# Patient Record
Sex: Male | Born: 2000 | Race: Black or African American | Hispanic: No | Marital: Single | Smoking: Current every day smoker
Health system: Southern US, Community
[De-identification: ages and names within clinical notes are randomized; demographics above are authoritative.]

---

## 2020-07-26 ENCOUNTER — Other Ambulatory Visit: Payer: Self-pay

## 2020-07-26 ENCOUNTER — Ambulatory Visit (HOSPITAL_COMMUNITY)
Admission: EM | Admit: 2020-07-26 | Discharge: 2020-07-26 | Disposition: A | Discharge status: Home / Self Care | Payer: Self-pay

## 2020-07-26 ENCOUNTER — Emergency Department (HOSPITAL_COMMUNITY): Payer: No Typology Code available for payment source

## 2020-07-26 ENCOUNTER — Emergency Department (HOSPITAL_COMMUNITY)
Admission: EM | Admit: 2020-07-26 | Discharge: 2020-07-31 | Disposition: A | Discharge status: Home / Self Care | Payer: No Typology Code available for payment source | Attending: Emergency Medicine | Admitting: Emergency Medicine

## 2020-07-26 ENCOUNTER — Encounter (HOSPITAL_COMMUNITY): Payer: Self-pay | Admitting: Emergency Medicine

## 2020-07-26 DIAGNOSIS — Y92410 Unspecified street and highway as the place of occurrence of the external cause: Secondary | ICD-10-CM | POA: Insufficient documentation

## 2020-07-26 DIAGNOSIS — F3113 Bipolar disorder, current episode manic without psychotic features, severe: Secondary | ICD-10-CM | POA: Insufficient documentation

## 2020-07-26 DIAGNOSIS — F309 Manic episode, unspecified: Secondary | ICD-10-CM

## 2020-07-26 DIAGNOSIS — M25561 Pain in right knee: Secondary | ICD-10-CM | POA: Diagnosis not present

## 2020-07-26 DIAGNOSIS — Z046 Encounter for general psychiatric examination, requested by authority: Secondary | ICD-10-CM | POA: Diagnosis present

## 2020-07-26 DIAGNOSIS — Y900 Blood alcohol level of less than 20 mg/100 ml: Secondary | ICD-10-CM | POA: Insufficient documentation

## 2020-07-26 DIAGNOSIS — Z79899 Other long term (current) drug therapy: Secondary | ICD-10-CM | POA: Diagnosis not present

## 2020-07-26 DIAGNOSIS — F3110 Bipolar disorder, current episode manic without psychotic features, unspecified: Secondary | ICD-10-CM | POA: Diagnosis present

## 2020-07-26 DIAGNOSIS — Z20822 Contact with and (suspected) exposure to covid-19: Secondary | ICD-10-CM | POA: Diagnosis not present

## 2020-07-26 DIAGNOSIS — F1220 Cannabis dependence, uncomplicated: Secondary | ICD-10-CM | POA: Diagnosis not present

## 2020-07-26 DIAGNOSIS — R519 Headache, unspecified: Secondary | ICD-10-CM | POA: Insufficient documentation

## 2020-07-26 LAB — CBC WITH DIFFERENTIAL/PLATELET
Abs Immature Granulocytes: 0.04 10*3/uL (ref 0.00–0.07)
Basophils Absolute: 0 10*3/uL (ref 0.0–0.1)
Basophils Relative: 0 %
Eosinophils Absolute: 0 10*3/uL (ref 0.0–0.5)
Eosinophils Relative: 0 %
HCT: 42.5 % (ref 39.0–52.0)
Hemoglobin: 13.6 g/dL (ref 13.0–17.0)
Immature Granulocytes: 0 %
Lymphocytes Relative: 15 %
Lymphs Abs: 2 10*3/uL (ref 0.7–4.0)
MCH: 27.6 pg (ref 26.0–34.0)
MCHC: 32 g/dL (ref 30.0–36.0)
MCV: 86.4 fL (ref 80.0–100.0)
Monocytes Absolute: 1.2 10*3/uL — ABNORMAL HIGH (ref 0.1–1.0)
Monocytes Relative: 9 %
Neutro Abs: 9.6 10*3/uL — ABNORMAL HIGH (ref 1.7–7.7)
Neutrophils Relative %: 76 %
Platelets: 317 10*3/uL (ref 150–400)
RBC: 4.92 MIL/uL (ref 4.22–5.81)
RDW: 13.8 % (ref 11.5–15.5)
WBC: 12.8 10*3/uL — ABNORMAL HIGH (ref 4.0–10.5)
nRBC: 0 % (ref 0.0–0.2)

## 2020-07-26 LAB — COMPREHENSIVE METABOLIC PANEL
ALT: 23 U/L (ref 0–44)
AST: 40 U/L (ref 15–41)
Albumin: 4.7 g/dL (ref 3.5–5.0)
Alkaline Phosphatase: 60 U/L (ref 38–126)
Anion gap: 13 (ref 5–15)
BUN: 15 mg/dL (ref 6–20)
CO2: 22 mmol/L (ref 22–32)
Calcium: 9.6 mg/dL (ref 8.9–10.3)
Chloride: 101 mmol/L (ref 98–111)
Creatinine, Ser: 1.17 mg/dL (ref 0.61–1.24)
GFR, Estimated: >60 mL/min (ref >60)
Glucose, Bld: 79 mg/dL (ref 70–99)
Potassium: 3.9 mmol/L (ref 3.5–5.1)
Sodium: 136 mmol/L (ref 135–145)
Total Bilirubin: 1 mg/dL (ref 0.3–1.2)
Total Protein: 7.9 g/dL (ref 6.5–8.1)

## 2020-07-26 LAB — ETHANOL: Alcohol, Ethyl (B): <10 mg/dL (ref <10)

## 2020-07-26 LAB — RESP PANEL BY RT-PCR (FLU A&B, COVID) ARPGX2
Influenza A by PCR: NEGATIVE
Influenza B by PCR: NEGATIVE
SARS Coronavirus 2 by RT PCR: NEGATIVE

## 2020-07-26 MED ORDER — ACETAMINOPHEN 325 MG PO TABS
650.0000 mg | ORAL_TABLET | ORAL | Status: DC | PRN
  Administered 2020-07-29: 650 mg via ORAL
  Filled 2020-07-26: qty 2

## 2020-07-26 MED ORDER — STERILE WATER FOR INJECTION IJ SOLN
INTRAMUSCULAR | Status: AC
  Administered 2020-07-26: 1.2 mL
  Filled 2020-07-26: qty 10

## 2020-07-26 MED ORDER — ZOLPIDEM TARTRATE 5 MG PO TABS
5.0000 mg | ORAL_TABLET | Freq: Every evening | ORAL | Status: DC | PRN
  Administered 2020-07-27: 5 mg via ORAL
  Filled 2020-07-26: qty 1

## 2020-07-26 MED ORDER — ZIPRASIDONE MESYLATE 20 MG IM SOLR
20.0000 mg | INTRAMUSCULAR | Status: AC | PRN
  Administered 2020-07-29: 20 mg via INTRAMUSCULAR
  Filled 2020-07-26: qty 20

## 2020-07-26 MED ORDER — STERILE WATER FOR INJECTION IJ SOLN
INTRAMUSCULAR | Status: AC
  Filled 2020-07-26: qty 10

## 2020-07-26 MED ORDER — RISPERIDONE 2 MG PO TBDP
2.0000 mg | ORAL_TABLET | Freq: Three times a day (TID) | ORAL | Status: DC | PRN
  Administered 2020-07-27 – 2020-07-30 (×6): 2 mg via ORAL
  Filled 2020-07-26 (×6): qty 1

## 2020-07-26 MED ORDER — ZIPRASIDONE MESYLATE 20 MG IM SOLR
20.0000 mg | Freq: Once | INTRAMUSCULAR | Status: AC
  Administered 2020-07-26: 20 mg via INTRAMUSCULAR
  Filled 2020-07-26: qty 20

## 2020-07-26 MED ORDER — LORAZEPAM 1 MG PO TABS
2.0000 mg | ORAL_TABLET | Freq: Once | ORAL | Status: DC
  Filled 2020-07-26: qty 2

## 2020-07-26 MED ORDER — LORAZEPAM 1 MG PO TABS
1.0000 mg | ORAL_TABLET | ORAL | Status: AC | PRN
  Administered 2020-07-27: 1 mg via ORAL
  Filled 2020-07-26: qty 1

## 2020-07-26 NOTE — ED Notes (Signed)
Patients belongings placed in locker 4.

## 2020-07-26 NOTE — ED Notes (Signed)
Pt demonstrating flight of ideas.  Easily redirected.  Sitter present
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
## 2020-07-26 NOTE — ED Notes (Signed)
Per [employer] patient has been placed under emergency IVC due to manic presentation, stating he is a danger to himself.

## 2020-07-26 NOTE — ED Notes (Signed)
Patient transported to CT 

## 2020-07-26 NOTE — ED Notes (Signed)
This RN spoke to staffing who said at this moment he does not have any one to come sit with pt

## 2020-07-26 NOTE — ED Notes (Signed)
Pt stating that he is not tired and he is not going to go to sleep. RN offered pt medicine, pt refused at this time. Pt is not currently agitated it seems, pt just states that he is bored. Pt requesting coloring pages.

## 2020-07-26 NOTE — ED Notes (Signed)
Pt moved to room 41 for safety as pt sitter has left and there is not another sitter coming.

## 2020-07-26 NOTE — ED Notes (Signed)
Pt requesting crayons and paper to draw - pt given items
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
## 2020-07-26 NOTE — ED Notes (Signed)
Security rounding at bedside - pt talking outside of room loudly to security - pt asked to keep volume down.

## 2020-07-26 NOTE — ED Notes (Addendum)
Pt more awake, ambulated to the bathroom with steady gait.  Did not collect specimen,  Wishes to speak to doctor about discharge.

## 2020-07-26 NOTE — ED Notes (Signed)
Patient extremely manic and refusing to change into purple scrubs at this time. IM Geodon given with security and [employer] at bedside with no issue. Pt then changed into purple scrubs,wanded by security, and belongings inventoried.

## 2020-07-26 NOTE — ED Notes (Signed)
Pt speaking loudly in room, flight of ideas.  Security did walk by, pt offered po fluids, redirected. Continues to ask to leave and ask for phone

## 2020-07-26 NOTE — ED Notes (Addendum)
Pt continuing to stand in doorway of room - pt demonstrating flight of ideas, talking loudly - pt requesting [place] sandwich - pt given [place] sandwich and water

## 2020-07-26 NOTE — ED Notes (Signed)
Derrick Joseph, mom, [phone] would like an update when available
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
## 2020-07-26 NOTE — ED Notes (Signed)
Pt continuing to talk outside of room - flight of ideas - pt asked multiple times to keep volume down

## 2020-07-26 NOTE — ED Notes (Signed)
Pt making call to his mother

## 2020-07-26 NOTE — ED Notes (Signed)
Everlean Alstrom, step-dad, back up # [phone]

## 2020-07-27 LAB — RAPID URINE DRUG SCREEN, HOSP PERFORMED
Amphetamines: NOT DETECTED
Barbiturates: NOT DETECTED
Benzodiazepines: NOT DETECTED
Cocaine: NOT DETECTED
Opiates: NOT DETECTED
Tetrahydrocannabinol: POSITIVE — AB

## 2020-07-27 MED ORDER — ZIPRASIDONE HCL 20 MG PO CAPS
40.0000 mg | ORAL_CAPSULE | Freq: Two times a day (BID) | ORAL | Status: DC
  Administered 2020-07-27 – 2020-07-31 (×8): 40 mg via ORAL
  Filled 2020-07-27 (×2): qty 2
  Filled 2020-07-27 (×2): qty 1
  Filled 2020-07-27: qty 2
  Filled 2020-07-27: qty 1
  Filled 2020-07-27 (×2): qty 2

## 2020-07-27 NOTE — BH Assessment (Signed)
Per Hassie Bruce, RN, there is currently no appropriate bed for pt at [facility]. Thus, pt's referral information will be faxed out for potential placement.

## 2020-07-27 NOTE — ED Notes (Signed)
Per Duard Brady LMFT - Roselyn Bering, NP, reviewed pt's chart and information and determined pt meets inpatient criteria. Pt's referral information will be relayed to [facility], RN, for review and potential placement at [facility]. If there is not an appropriate bed for pt at [facility], pt's referral information will be faxed out to multiple hospitals for potential placement. I'll lyk if I'm informed that pt has been accepted to [facility]. Please lmk if you have any questions. Thank you, and have agreat day! Sam :)

## 2020-07-27 NOTE — Progress Notes (Signed)
Pt made 1 of 2 phone calls to his mother and verbally expressed understanding that he is only allowed two 5 min calls

## 2020-07-27 NOTE — ED Notes (Signed)
Pt stating to this RN that he would like to rest but cannot. Pt stating "give me something to sleep" - PRN Ambien in orders to be given.

## 2020-07-27 NOTE — ED Notes (Signed)
Breakfast Ordered 

## 2020-07-27 NOTE — Progress Notes (Signed)
Pt to purple zone to take shower. Pt took a shower but placed clothes from previous day back on. Stated that "the shower wouldn't get warm, so I just put my old clothes back on."

## 2020-07-27 NOTE — ED Notes (Signed)
Mother updated via phone

## 2020-07-27 NOTE — Progress Notes (Signed)
Pt refused to eat breakfast. Asked for water instead.

## 2020-07-27 NOTE — ED Notes (Signed)
Pt ongoing TTS at this time  

## 2020-07-27 NOTE — ED Notes (Signed)
Pt making 2 of 2 phone calls to "his girl"

## 2020-07-27 NOTE — Progress Notes (Signed)
Per Vivien Presto, patient meets criteria for inpatient treatment. There are no available or appropriate beds at [facility] today. CSW faxed referrals to the following facilities for review:  [facility]  TTS will continue to seek bed placement.  Crissie Reese, MSW, LCSW-A, LCAS-A Phone: [phone] Disposition/TOC
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
## 2020-07-28 MED ORDER — NICOTINE 14 MG/24HR TD PT24
14.0000 mg | MEDICATED_PATCH | Freq: Once | TRANSDERMAL | Status: AC
  Administered 2020-07-28: 14 mg via TRANSDERMAL
  Filled 2020-07-28: qty 1

## 2020-07-28 NOTE — Progress Notes (Signed)
CSW spoke with Kennedy Bucker from [facility] who is inquiring about this patient for possible placement. CSW provided Kennedy Bucker with the patient's nurse number for further review. Kennedy Bucker asked if the IVC paperwork can be faxed to Fax#: [phone].  Crissie Reese, MSW, LCSW-A, LCAS-A Phone: [phone] Disposition/TOC

## 2020-07-28 NOTE — B[facility] Counselor (Addendum)
RE-assessment:    TTS re-assessment. Patient continues to display manic behavior. During the start of the re-assessment patient stated, "ya'll got me missing $20,000-$40,000." Patient rambling discussing how he can buy the hospital and have money, money, and money. Discussed he got $20,000 sweet suits coming in. Discussed he's a  millionaire and need to be discharged because he's scared for his life. 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   Patient present with disorganized thought, word salad and tangential language.   Disposition: Per Vivien Presto, patient meets criteria for inpatient treatment.

## 2020-07-28 NOTE — ED Notes (Signed)
Pt taking another shower at this time.

## 2020-07-28 NOTE — ED Notes (Signed)
Patient is resting comfortably. 

## 2020-07-28 NOTE — ED Notes (Signed)
Pt on the phone, second phone call of the day.

## 2020-07-28 NOTE — ED Notes (Signed)
RN spoke w/ Kennedy Bucker from [facility] regarding pt's behavior and medications.

## 2020-07-28 NOTE — Progress Notes (Signed)
Per Vivien Presto, patient meets criteria for inpatient treatment. There are no available or appropriate beds at [facility] today. CSW re-faxed referrals to the following facilities for review:  [facility] Or[facility] Pollen  TTS will continue to seek bed placement.  Crissie Reese, MSW, LCSW-A, LCAS-A Phone: [phone] Disposition/TOC

## 2020-07-28 NOTE — ED Notes (Signed)
Pt asked for 2 [place] sandwiches, this RN informed pt that he can only have 1, pt then declined any sandwiches at this time. Pt standing in doorway of room with towel wrapped around his head without a shirt on, rapping to himself.

## 2020-07-28 NOTE — ED Notes (Signed)
Pt ambulatory to restroom.  Given toothbrush per request

## 2020-07-28 NOTE — ED Notes (Signed)
RN spoke to pt's mom, Sondra Barges. Updated on pt's POC and possible placement at [facility]. Pt's mom would like to be updated if pt gets accepted, [phone]. Pt's mom also left pt's step-dad, Sheralyn Boatman number [phone].

## 2020-07-28 NOTE — ED Notes (Signed)
Pt given PO fluids.  Ambulatory to restroom

## 2020-07-29 ENCOUNTER — Encounter (HOSPITAL_COMMUNITY): Payer: Self-pay | Admitting: Registered Nurse

## 2020-07-29 DIAGNOSIS — F3110 Bipolar disorder, current episode manic without psychotic features, unspecified: Secondary | ICD-10-CM | POA: Diagnosis present

## 2020-07-29 DIAGNOSIS — F1220 Cannabis dependence, uncomplicated: Secondary | ICD-10-CM

## 2020-07-29 NOTE — ED Provider Notes (Signed)
Emergency Medicine Observation Re-evaluation Note  Derrick Joseph is a 20 y.o. male, seen on rounds today.  Pt initially presented to the ED for complaints of Psychiatric Evaluation Currently, the patient is resting comfortably.  Physical Exam  BP (!) 148/88 (BP Location: Left Arm)   Pulse 99   Temp 97.7 F (36.5 C) (Oral)   Resp 20   Ht 5\' 5"  (1.651 m)   Wt 59 kg   SpO2 100%   BMI 21.63 kg/m  Physical Exam General: Nontoxic Cardiac: Normal heart rate Lungs: Normal respiratory rate Psych: Standing in doorway, talking constantly, appears to be manic.  No periods of internal responsiveness.  ED Course / MDM  EKG:EKG Interpretation  Date/Time:  Saturday July 27 2020 17:46:03 EDT Ventricular Rate:  76 PR Interval:  138 QRS Duration: 84 QT Interval:  362 QTC Calculation: 407 R Axis:   83 Text Interpretation: Normal sinus rhythm Normal ECG Confirmed by 01-29-1994 [id number]) on 07/28/2020 12:02:00 PM  I have reviewed the labs performed to date as well as medications administered while in observation.  Recent changes in the last 24 hours include remaining cooperative.  Plan  Current plan is for psychiatric admission. Derrick Joseph is under involuntary commitment.      Marge Duncans, MD 07/29/20 [phone]

## 2020-07-29 NOTE — ED Notes (Addendum)
Pt visualized rinsing mouth and continually spitting in sink. Pt visualized doing this right after admin of ODT Risperidone. Pt continually rinsing mouth and spitting in sink, chewing on washcloth. Unable to elaborate on reasoning for oral fixation. Pt did not absorb any of ODT Risperidone due to spitting in sink immediately after receiving ODT med.

## 2020-07-29 NOTE — ED Notes (Signed)
Pt continues w/ oral fixation about "mouth hurting" no obvious injury, unable to point to location of pain. When questioned pt about why mouth hurts, states "I DONT FUCKING KNOW I CANT TALK". Pt striking and shoving furniture towards this RN, shouting and uncooperative. Refusing to follow commands.

## 2020-07-29 NOTE — ED Notes (Signed)
Pt continues to rinse out mouth and spit in room. Asked pt why and he stated he needs to "clean his tongue". Offered toothbrush, pt refused. Continued to rinse mouth and spit in sink

## 2020-07-29 NOTE — ED Notes (Signed)
TTS in use at this time. 

## 2020-07-29 NOTE — Progress Notes (Signed)
Patient information has been sent to [facility] via secure chat to review for potential admission. Patient meets inpatient criteria per Ophelia Shoulder, NP.   Situation ongoing, CSW will continue to monitor progress.    Signed:  Damita Dunnings, MSW, LCSW-A  07/29/2020 8:26 AM
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
## 2020-07-30 MED ORDER — NICOTINE 14 MG/24HR TD PT24
14.0000 mg | MEDICATED_PATCH | Freq: Once | TRANSDERMAL | Status: AC
  Administered 2020-07-30: 14 mg via TRANSDERMAL
  Filled 2020-07-30: qty 1

## 2020-07-30 NOTE — ED Notes (Signed)
No sitter with pt. Pt in room cleaning up with a towel. Aware facility won't take him until after 2200. Pt wanted to add coloring pages to belongings so placed in a new bag and placed with his belongings and belonging sheet updated.

## 2020-07-30 NOTE — ED Notes (Signed)
No call back for pt transport. Pt sitting in room. Still no sitter available. NAD noted. Ambulatory to restroom frequently.

## 2020-07-30 NOTE — ED Notes (Signed)
Attempted report at [facility]

## 2020-07-30 NOTE — ED Notes (Signed)
Pt ate dinner tray and is now sitting up coloring right outside room. Denies further needs. NAD noted. Sitter remains.

## 2020-07-30 NOTE — ED Notes (Signed)
Brought pt [place] sandwich

## 2020-07-30 NOTE — ED Notes (Addendum)
Attempted report for [facility], per admitting RN facility understaffed. RN of this pt will call back.

## 2020-07-30 NOTE — ED Notes (Signed)
Breakfast Order Placed ?

## 2020-07-30 NOTE — ED Notes (Signed)
Pt sitting at nurse's station coloring. Needs frequent redirection because he has racing thoughts and I can't keep up with his requests. I told him one more snack and then none the rest of the night because he keeps trying to eat and he needs to go to his room and try and get some rest.

## 2020-07-30 NOTE — ED Notes (Signed)
Pt appears to be asleep. NAD noted. 

## 2020-07-30 NOTE — ED Notes (Signed)
Pt  excessively talkative and loud. Increase flight of ideas, tangential.

## 2020-07-30 NOTE — ED Notes (Signed)
Per Martie Round RN from [facility], pt cannot be accepted into the facility until 2200. Pt given Geodon at 2200 last night and per [facility] pt cannot be received if given emergent behavioral medication with in 24 hours.

## 2020-07-30 NOTE — Progress Notes (Signed)
Pt accepted to [facility]     Patient meets inpatient criteria per Assunta Found, NP  Dr.Thomas Loyola Mast is the attending provider.    Call report to [phone]    Jannifer Franklin, RN @ [facility] ED notified.     Pt scheduled  to arrive at [facility] today by 1600  Damita Dunnings, MSW, LCSW-A  10:47 AM 07/30/2020

## 2020-07-30 NOTE — ED Notes (Signed)
Pt at nurses station speaking with staff. Pt is being polite and behaving appropriately. Sitter remains.

## 2020-07-30 NOTE — ED Notes (Signed)
Pt sitting at nurses station talking with staff, intermittently coloring.

## 2020-07-30 NOTE — Progress Notes (Signed)
Patient has been faxed out due to no beds available at [facility]. Patient meets inpatient criteria per, Assunta Found, NP. Patient referred to the following facilities:  [facility] [place] [address] [phone]  [facility]  [address] [place] [address] [phone]  [facility]  [address] [place] [address] [phone]  [facility]  [address] [place], [place] [address] [phone]  [facility]  [address] [place] [address] [phone]  [facility]  [address] [place] [address] [phone]  [facility]  [address] [place] [address] [phone]  [facility]  [address] [place] [address] [phone]  [facility]  [address] [place] [address] [phone]  [facility]  [address] [place] [address] [phone]  [facility]  [address] [place] [address] [phone]  [facility]  [address] [place] [address] [phone]  [facility]  [address] [place] [address] [phone]  [facility]  [address] [place], [place] [address] [phone]  [facility]  [address], [place] [address] [phone]  [facility]  [address] [place] [address] [phone]    CSW will continue to monitor disposition.   Damita Dunnings, MSW, LCSW-A  10:36 AM 07/30/2020

## 2020-07-30 NOTE — ED Notes (Signed)
Pt currently in shower.

## 2020-07-30 NOTE — ED Notes (Signed)
Pt sitting up outside of room speaking with the sitter. Pt coloring. I told pt he was accepted to [facility], but they won't take him until 2200 today. Pt asked to call his mom and I gave him permission. Pt also coloring. Denies further needs.
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
## 2020-07-31 MED ORDER — NICOTINE 14 MG/24HR TD PT24
14.0000 mg | MEDICATED_PATCH | Freq: Once | TRANSDERMAL | Status: DC
  Administered 2020-07-31: 14 mg via TRANSDERMAL
  Filled 2020-07-31: qty 1
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
## 2020-07-31 NOTE — ED Notes (Signed)
Pt at desk talking to brother on the phone

## 2020-07-31 NOTE — ED Notes (Signed)
Pt continues to sleep. NAD noted. Sitter now present.

## 2020-07-31 NOTE — ED Notes (Signed)
Pt continues to sleep. NAD noted. Sitter remains. 

## 2020-07-31 NOTE — ED Notes (Signed)
Pt up to desk saying that he is bored and asking for more coloring pages. Pages printed out and other completed pages placed with belongings in locker 4
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
## 2020-08-19 ENCOUNTER — Other Ambulatory Visit: Payer: Self-pay

## 2020-08-19 ENCOUNTER — Ambulatory Visit (HOSPITAL_COMMUNITY)
Admission: EM | Admit: 2020-08-19 | Discharge: 2020-08-20 | Disposition: A | Discharge status: Other Acute Inpatient Hospital | Payer: No Payment, Other | Attending: Student | Admitting: Student

## 2020-08-19 DIAGNOSIS — F312 Bipolar disorder, current episode manic severe with psychotic features: Secondary | ICD-10-CM | POA: Insufficient documentation

## 2020-08-19 DIAGNOSIS — F1290 Cannabis use, unspecified, uncomplicated: Secondary | ICD-10-CM | POA: Diagnosis not present

## 2020-08-19 DIAGNOSIS — Z8659 Personal history of other mental and behavioral disorders: Secondary | ICD-10-CM | POA: Insufficient documentation

## 2020-08-19 MED ORDER — LORAZEPAM 1 MG PO TABS
2.0000 mg | ORAL_TABLET | Freq: Once | ORAL | Status: AC
  Administered 2020-08-19: 2 mg via ORAL
  Filled 2020-08-19: qty 2
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
## 2020-08-19 NOTE — ED Notes (Signed)
Pt is on child adolescent side of unit due to being maniac and loud. Patient mom is taking IVC paperwork out on pt. Then pt will have to be served

## 2020-08-20 ENCOUNTER — Encounter (HOSPITAL_COMMUNITY): Payer: Self-pay | Admitting: Emergency Medicine

## 2020-08-20 ENCOUNTER — Emergency Department (HOSPITAL_COMMUNITY)
Admission: EM | Admit: 2020-08-20 | Discharge: 2020-08-26 | Disposition: A | Discharge status: Home / Self Care | Payer: Medicaid Other | Attending: Emergency Medicine | Admitting: Emergency Medicine

## 2020-08-20 DIAGNOSIS — R451 Restlessness and agitation: Secondary | ICD-10-CM

## 2020-08-20 DIAGNOSIS — F1220 Cannabis dependence, uncomplicated: Secondary | ICD-10-CM | POA: Diagnosis present

## 2020-08-20 DIAGNOSIS — F1210 Cannabis abuse, uncomplicated: Secondary | ICD-10-CM | POA: Insufficient documentation

## 2020-08-20 DIAGNOSIS — U071 COVID-19: Secondary | ICD-10-CM | POA: Insufficient documentation

## 2020-08-20 DIAGNOSIS — Y900 Blood alcohol level of less than 20 mg/100 ml: Secondary | ICD-10-CM | POA: Insufficient documentation

## 2020-08-20 DIAGNOSIS — Z79899 Other long term (current) drug therapy: Secondary | ICD-10-CM | POA: Insufficient documentation

## 2020-08-20 DIAGNOSIS — F3110 Bipolar disorder, current episode manic without psychotic features, unspecified: Secondary | ICD-10-CM | POA: Diagnosis present

## 2020-08-20 DIAGNOSIS — F3112 Bipolar disorder, current episode manic without psychotic features, moderate: Secondary | ICD-10-CM | POA: Insufficient documentation

## 2020-08-20 LAB — CBC WITH DIFFERENTIAL/PLATELET
Abs Immature Granulocytes: 0.03 10*3/uL (ref 0.00–0.07)
Basophils Absolute: 0 10*3/uL (ref 0.0–0.1)
Basophils Relative: 1 %
Eosinophils Absolute: 0.1 10*3/uL (ref 0.0–0.5)
Eosinophils Relative: 2 %
HCT: 40.4 % (ref 39.0–52.0)
Hemoglobin: 12.8 g/dL — ABNORMAL LOW (ref 13.0–17.0)
Immature Granulocytes: 0 %
Lymphocytes Relative: 30 %
Lymphs Abs: 2.5 10*3/uL (ref 0.7–4.0)
MCH: 27.6 pg (ref 26.0–34.0)
MCHC: 31.7 g/dL (ref 30.0–36.0)
MCV: 87.1 fL (ref 80.0–100.0)
Monocytes Absolute: 0.8 10*3/uL (ref 0.1–1.0)
Monocytes Relative: 10 %
Neutro Abs: 4.6 10*3/uL (ref 1.7–7.7)
Neutrophils Relative %: 57 %
Platelets: 335 10*3/uL (ref 150–400)
RBC: 4.64 MIL/uL (ref 4.22–5.81)
RDW: 14.3 % (ref 11.5–15.5)
WBC: 8.1 10*3/uL (ref 4.0–10.5)
nRBC: 0 % (ref 0.0–0.2)

## 2020-08-20 LAB — COMPREHENSIVE METABOLIC PANEL
ALT: 59 U/L — ABNORMAL HIGH (ref 0–44)
AST: 30 U/L (ref 15–41)
Albumin: 4.4 g/dL (ref 3.5–5.0)
Alkaline Phosphatase: 68 U/L (ref 38–126)
Anion gap: 8 (ref 5–15)
BUN: 15 mg/dL (ref 6–20)
CO2: 28 mmol/L (ref 22–32)
Calcium: 9.6 mg/dL (ref 8.9–10.3)
Chloride: 104 mmol/L (ref 98–111)
Creatinine, Ser: 1 mg/dL (ref 0.61–1.24)
GFR, Estimated: >60 mL/min (ref >60)
Glucose, Bld: 52 mg/dL — ABNORMAL LOW (ref 70–99)
Potassium: 3.8 mmol/L (ref 3.5–5.1)
Sodium: 140 mmol/L (ref 135–145)
Total Bilirubin: 0.3 mg/dL (ref 0.3–1.2)
Total Protein: 7.5 g/dL (ref 6.5–8.1)

## 2020-08-20 LAB — ACETAMINOPHEN LEVEL: Acetaminophen (Tylenol), Serum: <10 ug/mL — ABNORMAL LOW (ref 10–30)

## 2020-08-20 LAB — RAPID URINE DRUG SCREEN, HOSP PERFORMED
Amphetamines: NOT DETECTED
Barbiturates: NOT DETECTED
Benzodiazepines: NOT DETECTED
Cocaine: NOT DETECTED
Opiates: NOT DETECTED
Tetrahydrocannabinol: POSITIVE — AB

## 2020-08-20 LAB — RESP PANEL BY RT-PCR (FLU A&B, COVID) ARPGX2
Influenza A by PCR: NEGATIVE
Influenza B by PCR: NEGATIVE
SARS Coronavirus 2 by RT PCR: POSITIVE — AB

## 2020-08-20 LAB — CBG MONITORING, ED: Glucose-Capillary: 81 mg/dL (ref 70–99)

## 2020-08-20 LAB — ETHANOL: Alcohol, Ethyl (B): <10 mg/dL (ref <10)

## 2020-08-20 LAB — SALICYLATE LEVEL: Salicylate Lvl: <7 mg/dL — ABNORMAL LOW (ref 7.0–30.0)

## 2020-08-20 MED ORDER — LORAZEPAM 1 MG PO TABS
2.0000 mg | ORAL_TABLET | Freq: Once | ORAL | Status: AC
  Administered 2020-08-20: 2 mg via ORAL
  Filled 2020-08-20: qty 2

## 2020-08-20 MED ORDER — NICOTINE 21 MG/24HR TD PT24
21.0000 mg | MEDICATED_PATCH | Freq: Every day | TRANSDERMAL | Status: DC
  Administered 2020-08-20 – 2020-08-24 (×5): 21 mg via TRANSDERMAL
  Filled 2020-08-20 (×6): qty 1

## 2020-08-20 MED ORDER — HYDROXYZINE HCL 50 MG PO TABS
50.0000 mg | ORAL_TABLET | Freq: Four times a day (QID) | ORAL | Status: DC | PRN
  Administered 2020-08-20 – 2020-08-26 (×12): 50 mg via ORAL
  Filled 2020-08-20 (×14): qty 1

## 2020-08-20 MED ORDER — ZIPRASIDONE MESYLATE 20 MG IM SOLR
20.0000 mg | Freq: Once | INTRAMUSCULAR | Status: AC
  Administered 2020-08-20: 20 mg via INTRAMUSCULAR
  Filled 2020-08-20: qty 20

## 2020-08-20 MED ORDER — ZIPRASIDONE MESYLATE 20 MG IM SOLR: 20.0000 mg | Freq: Once | INTRAMUSCULAR | Status: DC

## 2020-08-20 MED ORDER — ACETAMINOPHEN 325 MG PO TABS
650.0000 mg | ORAL_TABLET | ORAL | Status: DC | PRN
  Administered 2020-08-21 – 2020-08-23 (×2): 650 mg via ORAL
  Filled 2020-08-20 (×2): qty 2

## 2020-08-20 MED ORDER — ONDANSETRON HCL 4 MG PO TABS: 4.0000 mg | ORAL_TABLET | Freq: Three times a day (TID) | ORAL | Status: DC | PRN

## 2020-08-20 MED ORDER — OLANZAPINE 5 MG PO TABS
20.0000 mg | ORAL_TABLET | Freq: Every day | ORAL | Status: DC
  Administered 2020-08-20 – 2020-08-25 (×7): 20 mg via ORAL
  Filled 2020-08-20 (×10): qty 4

## 2020-08-20 MED ORDER — STERILE WATER FOR INJECTION IJ SOLN
10.0000 mL | Freq: Once | INTRAMUSCULAR | Status: AC
  Administered 2020-08-20: 10 mL via INTRAMUSCULAR
  Filled 2020-08-20: qty 10

## 2020-08-20 MED ORDER — ALUM & MAG HYDROXIDE-SIMETH 200-200-20 MG/5ML PO SUSP
30.0000 mL | Freq: Four times a day (QID) | ORAL | Status: DC | PRN
  Administered 2020-08-22: 30 mL via ORAL
  Filled 2020-08-20: qty 30

## 2020-08-20 NOTE — ED Triage Notes (Signed)
Pt from [facility] arrived w/ LEO under IVC.  Pt is very manic and uncooperative w/ questions, demonstrating a flight of ideas.  Was given night meds at facility per [facility].  Given Geodon in triage.
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
## 2020-08-20 NOTE — ED Notes (Signed)
PT came out of the room asking for "Paper with all military information. I need my glasses because I cant see and I need the police to get me out of here." Will check in his bag for glasses

## 2020-08-20 NOTE — BH Assessment (Addendum)
Comprehensive Clinical Assessment (CCA) Note  08/20/2020 Derrick Joseph [id number]  Discharge Disposition: Derrick John, PA-C, reviewed pt's chart and information and met with pt face-to-face and determined pt meets inpatient criteria. There is currently no appropriate bed for pt at [facility], so pt will be transferred to [facility] for overnight observation. Pt's referral information will be reviewed by [facility] tomorrow and, if still no beds are available, pt's referral information will be faxed out to multiple hospitals for potential placement.  The patient demonstrates the following risk factors for suicide: Chronic risk factors for suicide include: psychiatric disorder of Bipolar I disorder, Current or most recent episode manic, Severe and demographic factors (male, >46 y/o). Acute risk factors for suicide include: family or marital conflict, unemployment, and recent discharge from inpatient psychiatry. Protective factors for this patient include: positive social support, hope for the future, and life satisfaction. Considering these factors, the overall suicide risk at this point appears to be none. Patient is not appropriate for outpatient follow up.  Therefore, no sitter is recommended for suicide precautions.  [place] ED from 08/19/2020 in [facility] Most recent reading at 08/20/2020  4:23 AM ED from 08/20/2020 in [place] Most recent reading at 08/20/2020  1:56 AM ED from 07/26/2020 in [place] Most recent reading at 07/29/2020  9:28 AM  C-SSRS RISK CATEGORY No Risk Low Risk No Risk     Chief Complaint: No chief complaint on file.  Visit Diagnosis: F31.13, Bipolar I disorder, Current or most recent episode manic, Severe  CCA Screening, Triage and Referral (STR) Derrick Joseph is a 21 year old patient who was brought to the [facility]) by the [facility] due to pt behaving  in a manic manner. Pt states he was brought to the [facility] because he and his mother are having difficulties getting along; he states he's going to file a restraining order against her in the morning.  Pt denies SI, a hx of SI, and any prior incidents to kill himself or a plan to kill himself. Pt states he was d/c from [facility] on Friday, August 16, 2020 after a 22 day stay. Pt denies HI, AVH, NSSIB, access to guns/weapons, or engagement with the legal system. Pt acknowledges he uses marijuana and that he last used today.  Pt provided verbal consent for clinician and PA to make contact with his mother. Clinician and PA made contact with pt's mother at 2255. Pt's mother shared she believes pt has not gotten any better since prior to him going to [facility]. Pt's mother states pt has been taking his medication as prescribed but that it has not been helping him. Pt's mother shares pt has been delusional and paranoid; she states pt has been taking pictures of neighbor's cars and license plates and that she is worried for his safety. 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  Pt's orientation and memory are [place]. Pt was cooperative, though had flight of ideas, throughout the assessment process. Pt's insight, judgement, and impulse control are poor at this time.  Patient Reported Information How did you hear about [place]? Other (Comment) ([facility])  What Is the Reason for Your Visit/Call Today? Pt states he is here because he got into an argument with his mother; he states he's going to file a restraining order against her tomorrow. Pt shares  he was at [facility] for 22 days and that he was d/c this past Friday. Pt is manic and it's difficult to maintain a conversation with him/have him answer questions. 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  How Long Has This Been Causing You Problems? 1 wk - 1 month  What Do You Feel Would Help You the Most Today? -- (Pt would like to be d/c)   Have You Recently Had Any Thoughts About Hurting Yourself? No  Are You Planning to Commit Suicide/Harm Yourself At This time? No   Have you Recently Had Thoughts About [place]? No  Are You Planning to Harm Someone at This Time? No  Explanation: No data recorded  Have You Used Any Alcohol or Drugs in the Past 24 Hours? Yes  How Long Ago Did You Use Drugs or Alcohol? No data recorded What Did You Use and How Much? Pt was unable to provide an amount.   Do You Currently Have a Therapist/Psychiatrist? No  Name of Therapist/Psychiatrist: No data recorded  Have You Been Recently Discharged From Any Office Practice or Programs? Yes  Explanation of Discharge From Practice/Program: Pt was d/c from [facility] on Friday (August 16, 2020) after a stay of approximately 22 days.     CCA Screening Triage Referral Assessment Type of Contact: Face-to-Face  Telemedicine Service Delivery:   Is this Initial or Reassessment? Initial Assessment  Date Telepsych consult ordered in [facility]:  08/19/20  Time Telepsych consult ordered in [facility]:  2219  Location of Assessment: [facility] Assessment Services  Provider Location: [facility] Assessment Services   Collateral Involvement: Pt provided verbal consent for clinician and PA to make contact with his mother, Derrick Joseph, at [phone]. Clinician and PA made contact with pt's mother at [phone].   Does Patient Have a Stage manager Guardian? No data recorded Name and Contact of Legal Guardian: No data recorded If Minor and Not Living with Parent(s), Who has Custody? N/A  Is CPS involved or ever been involved? -- (UTA)  Is APS involved or ever been involved? -- (UTA)   Patient Determined To Be At Risk for Harm To Self or Others Based on  Review of Patient Reported Information or Presenting Complaint? No  Method: No data recorded Availability of Means: No data recorded Intent: No data recorded Notification Required: No data recorded Additional Information for Danger to Others Potential: No data recorded Additional Comments for Danger to Others Potential: No data recorded Are There Guns or Other Weapons in Your Home? No data recorded Types of Guns/Weapons: No data recorded Are These Weapons Safely Secured?                            No data recorded Who Could Verify You Are Able To Have These Secured: No data recorded Do You Have any Outstanding Charges, Pending Court Dates, Parole/Probation? No data recorded Contacted To Inform of Risk of Harm To Self or Others: Other: Comment (N/A)    Does Patient Present under Involuntary Commitment? Yes (Pt was initially voluntary but his mother filed IVC papers when there was concern that pt was going to leave the [facility].)  IVC Papers Initial File Date: 08/19/20   [place] of Residence: [place]   Patient Currently Receiving the Following Services: -- (  UTA)   Determination of Need: Emergent (2 hours)   Options For Referral: Inpatient Hospitalization; Outpatient Therapy; Medication Management     CCA Biopsychosocial Patient Reported Schizophrenia/Schizoaffective Diagnosis in Past: No   Strengths: Pt is friendly and, though distractable, was easily re-directed and followed directions. Pt was willing to take medication to help him calm down (Ativan 55m).   Mental Health Symptoms Depression:   None   Duration of Depressive symptoms:    Mania:   Change in energy/activity; Euphoria; Increased Energy; Overconfidence; Racing thoughts   Anxiety:    None   Psychosis:   None   Duration of Psychotic symptoms:  Duration of Psychotic Symptoms: Less than six months   Trauma:   None   Obsessions:   None   Compulsions:   None   Inattention:   None    Hyperactivity/Impulsivity:   None   Oppositional/Defiant Behaviors:   None   Emotional Irregularity:   Potentially harmful impulsivity; Mood lability   Other Mood/Personality Symptoms:   Pt is currently manic    Mental Status Exam Appearance and self-care  Stature:   Average   Weight:   Thin   Clothing:   Casual   Grooming:   Well-groomed   Cosmetic use:   None   Posture/gait:   Normal   Motor activity:   Not Remarkable   Sensorium  Attention:   Distractible   Concentration:   Focuses on irrelevancies   Orientation:   -- (UTA)   Recall/memory:   -- (UTA)   Affect and Mood  Affect:   Full Range   Mood:   Euphoric   Relating  Eye contact:   Normal   Facial expression:   Responsive   Attitude toward examiner:   Cooperative   Thought and Language  Speech flow:  Flight of Ideas   Thought content:   Appropriate to Mood and Circumstances   Preoccupation:   None   Hallucinations:   None   Organization:  No data recorded  E[employer] Sciences Corporationof Knowledge:   Average   Intelligence:   Average   Abstraction:   Functional   Judgement:   Impaired   Reality Testing:   Distorted   Insight:   Flashes of insight   Decision Making:   Impulsive   Social Functioning  Social Maturity:   Impulsive   Social Judgement:   Heedless; "Street Smart"   Stress  Stressors:   Family conflict   Coping Ability:   -- (Special educational needs teacher   Skill Deficits:   -- (Special educational needs teacher   Supports:   Family     Religion: Religion/Spirituality Are You A Religious Person?:  (UTA) How Might This Affect Treatment?: UTA  Leisure/Recreation: Leisure / Recreation Do You Have Hobbies?:  (UTA)  Exercise/Diet: Exercise/Diet Do You Exercise?:  (UTA) Have You Gained or Lost A Significant Amount of Weight in the Past Six Months?:  (UTA) Do You Follow a Special Diet?:  (UTA) Do You Have Any Trouble Sleeping?: Yes Explanation of Sleeping Difficulties: Pt's  mother shares pt has not been sleeping   CCA Employment/Education Employment/Work Situation: Employment / Work Situation Employment Situation:  (Special educational needs teacher Patient's Job has Been Impacted by Current Illness:  (UTA) Has Patient ever Been in the [employer]:  (UTA)  Education: Education Is Patient Currently Attending School?: No Last Grade Completed:  (UTA) Did You Attend College?:  (UTA) Did You Have An Individualized Education Program (IIEP):  (UTA) Did You Have Any Difficulty At School?:  ([place]  Patient's Education Has Been Impacted by Current Illness:  (UTA)   CCA Family/Childhood History Family and Relationship History: Family history Marital status: Single Does patient have children?:  (UTA)  Childhood History:  Childhood History By whom was/is the patient raised?: Mother Did patient suffer any verbal/emotional/physical/sexual abuse as a child?:  (UTA) Did patient suffer from severe childhood neglect?:  (UTA) Has patient ever been sexually abused/assaulted/raped as an adolescent or adult?:  (UTA) Was the patient ever a victim of a crime or a disaster?:  (UTA) Witnessed domestic violence?:  (UTA) Has patient been affected by domestic violence as an adult?:  Special educational needs teacher)  Child/Adolescent Assessment:     CCA Substance Use Alcohol/Drug Use: Alcohol / Drug Use Pain Medications: See MAR Prescriptions: See MAR Over the Counter: See MAR History of alcohol / drug use?: Yes Longest period of sobriety (when/how long): Unknown Negative Consequences of Use:  (None noted) Withdrawal Symptoms:  (None noted) Substance #1 Name of Substance 1: Marijuana 1 - Age of First Use: UTA 1 - Amount (size/oz): Unknown 1 - Frequency: Daily 1 - Duration: Unknown 1 - Last Use / Amount: 08/19/2020 1 - Method of Aquiring: Illegal purchase 1- Route of Use: Smoke/eat                       ASAM's:  Six Dimensions of Multidimensional Assessment  Dimension 1:  Acute Intoxication and/or  Withdrawal Potential:      Dimension 2:  Biomedical Conditions and Complications:      Dimension 3:  Emotional, Behavioral, or Cognitive Conditions and Complications:     Dimension 4:  Readiness to Change:     Dimension 5:  Relapse, Continued use, or Continued Problem Potential:     Dimension 6:  Recovery/Living Environment:     ASAM Severity Score:    ASAM Recommended Level of Treatment: ASAM Recommended Level of Treatment:  (UTA)   Substance use Disorder (SUD) Substance Use Disorder (SUD)  Checklist Symptoms of Substance Use:  (UTA)  Recommendations for Services/Supports/Treatments: Recommendations for Services/Supports/Treatments Recommendations For Services/Supports/Treatments: Inpatient Hospitalization, Individual Therapy, Medication Management  Discharge Disposition: Discharge Disposition Medical Exam completed: Yes Disposition of Patient: Movement to [facility] or [facility] ED Mode of transportation if patient is discharged/movement?: Other (comment) ([facility])  Derrick John, PA-C, reviewed pt's chart and information and met with pt face-to-face and determined pt meets inpatient criteria. There is currently no appropriate bed for pt at [facility], so pt will be transferred to [facility] for overnight observation. Pt's referral information will be reviewed by [facility] tomorrow and, if still no beds are available, pt's referral information will be faxed out to multiple hospitals for potential placement.  DSM5 Diagnoses: Patient Active Problem List   Diagnosis Date Noted   Bipolar disorder, current episode manic without psychotic features ([place]) 07/29/2020   Cannabis use disorder, moderate, in controlled environment ([place]) 07/29/2020     Referrals to Alternative Service(s): Referred to Alternative Service(s):   Place:   Date:   Time:    Referred to Alternative Service(s):   Place:   Date:   Time:    Referred to Alternative Service(s):   Place:   Date:   Time:    Referred to Alternative Service(s):   Place:    Date:   Time:     Dannielle Burn, LMFT

## 2020-08-20 NOTE — Consult Note (Signed)
Telepsych Consultation   Reason for Consult:  Manic behavior Referring Physician:  Steve Rattler Location of Patient: [facility] ED Location of Provider: Other: [facility]  Patient Identification: Derrick Joseph MRN:  [id number] Principal Diagnosis: Bipolar disorder, current episode manic without psychotic features (HCC) Diagnosis:  Principal Problem:   Bipolar disorder, current episode manic without psychotic features (HCC) Active Problems:   Cannabis use disorder, moderate, in controlled environment (HCC)   Total Time spent with patient: 30 minutes  Subjective:   Derrick Joseph is a 19 y.o. male patient admitted to [facility] ED after being sent from [facility] where he initially presented via sheriff's deputy with complaints manic behavior  HPI:  Derrick Joseph, 20 y.o., male patient seen via tele health by this provider, consulted with Dr. Nelly Rout; and chart reviewed on 08/20/20.  On evaluation Derrick Joseph reports he doesn't need to stay in the hospital and "Would like to be escorted to my house or yacht."  Patient state he needs to be alone.  Patient denies suicidal ideation "Never".  Patient also denies homicidal ideation and auditory/visual hallucinations.    During evaluation Derrick Joseph is sitting on side of bed in no acute distress.  He is alert, oriented x 4, calm and cooperative.  His mood is anxious and labile with congruent affect.  He continues to present disorganized, paranoid, and delusional thinking; although he denies psychosis and paranoia.  Patient also denies suicidal/self-harm/homicidal ideation.     Past Psychiatric History: Bipolar disorder   Past Medical History: History reviewed. No pertinent past medical history. History reviewed. No pertinent surgical history. Family History: History reviewed. No pertinent family history. Family Psychiatric  History: Unaware Social History:  Social History   Substance and Sexual Activity  Alcohol Use None     Social History    Substance and Sexual Activity  Drug Use Not on file    Social History   Socioeconomic History   Marital status: Single    Spouse name: Not on file   Number of children: Not on file   Years of education: Not on file   Highest education level: Not on file  Occupational History   Not on file  Tobacco Use   Smoking status: Not on file   Smokeless tobacco: Not on file  Substance and Sexual Activity   Alcohol use: Not on file   Drug use: Not on file   Sexual activity: Not on file  Other Topics Concern   Not on file  Social History Narrative   Not on file   Social Determinants of Health   Financial Resource Strain: Not on file  Food Insecurity: Not on file  Transportation Needs: Not on file  Physical Activity: Not on file  Stress: Not on file  Social Connections: Not on file   Additional Social History:    Allergies:  No Known Allergies  Labs:  Results for orders placed or performed during the hospital encounter of 08/20/20 (from the past 48 hour(s))  Resp Panel by RT-PCR (Flu A&B, Covid) Nasopharyngeal Swab     Status: Abnormal   Collection Time: 08/20/20  1:27 AM   Specimen: Nasopharyngeal Swab; Nasopharyngeal(NP) swabs in vial transport medium  Result Value Ref Range   SARS Coronavirus 2 by RT PCR POSITIVE (A) NEGATIVE    Comment: RESULT CALLED TO, READ BACK BY AND VERIFIED WITH: Ladora Daniel RN 08/20/2020 @0936  BY JW (NOTE) SARS-CoV-2 target nucleic acids are DETECTED.  The SARS-CoV-2 RNA is generally detectable in  upper respiratory specimens during the acute phase of infection. Positive results are indicative of the presence of the identified virus, but do not rule out bacterial infection or co-infection with other pathogens not detected by the test. Clinical correlation with patient history and other diagnostic information is necessary to determine patient infection status. The expected result is Negative.  Fact Sheet for  Patients: BloggerCourse.com  Fact Sheet for Healthcare Providers: SeriousBroker.it  This test is not yet approved or cleared by the [place] FDA and  has been authorized for detection and/or diagnosis of SARS-CoV-2 by FDA under an Emergency Use Authorization (EUA).  This EUA will remain in effect (meaning this test can  be used) for the duration of  the COVID-19 declaration under Section 564(b)(1) of the Act, 21 U.S.C. section 360bbb-3(b)(1), unless the authorization is terminated or revoked sooner.     Influenza A by PCR NEGATIVE NEGATIVE   Influenza B by PCR NEGATIVE NEGATIVE    Comment: (NOTE) The Xpert Xpress SARS-CoV-2/FLU/RSV plus assay is intended as an aid in the diagnosis of influenza from Nasopharyngeal swab specimens and should not be used as a sole basis for treatment. Nasal washings and aspirates are unacceptable for Xpert Xpress SARS-CoV-2/FLU/RSV testing.  Fact Sheet for Patients: BloggerCourse.com  Fact Sheet for Healthcare Providers: SeriousBroker.it  This test is not yet approved or cleared by the [place] FDA and has been authorized for detection and/or diagnosis of SARS-CoV-2 by FDA under an Emergency Use Authorization (EUA). This EUA will remain in effect (meaning this test can be used) for the duration of the COVID-19 declaration under Section 564(b)(1) of the Act, 21 U.S.C. section 360bbb-3(b)(1), unless the authorization is terminated or revoked.  Performed at [facility], [address], [place], [place] [address]   Urine rapid drug screen (hosp performed)     Status: Abnormal   Collection Time: 08/20/20  1:27 AM  Result Value Ref Range   Opiates NONE DETECTED NONE DETECTED   Cocaine NONE DETECTED NONE DETECTED   Benzodiazepines NONE DETECTED NONE DETECTED   Amphetamines NONE DETECTED NONE DETECTED   Tetrahydrocannabinol POSITIVE  (A) NONE DETECTED   Barbiturates NONE DETECTED NONE DETECTED    Comment: (NOTE) DRUG SCREEN FOR MEDICAL PURPOSES ONLY.  IF CONFIRMATION IS NEEDED FOR ANY PURPOSE, NOTIFY LAB WITHIN 5 DAYS.  LOWEST DETECTABLE LIMITS FOR URINE DRUG SCREEN Drug Class                     Cutoff (ng/mL) Amphetamine and metabolites    1000 Barbiturate and metabolites    200 Benzodiazepine                 200 Tricyclics and metabolites     300 Opiates and metabolites        300 Cocaine and metabolites        300 THC                            50 Performed at [facility], [address] [place], [place] [address]   Comprehensive metabolic panel     Status: Abnormal   Collection Time: 08/20/20  1:52 AM  Result Value Ref Range   Sodium 140 135 - 145 mmol/L   Potassium 3.8 3.5 - 5.1 mmol/L   Chloride 104 98 - 111 mmol/L   CO2 28 22 - 32 mmol/L   Glucose, Bld 52 (L) 70 - 99  mg/dL    Comment: Glucose reference range applies only to samples taken after fasting for at least 8 hours.   BUN 15 6 - 20 mg/dL   Creatinine, Ser 5.401.00 0.61 - 1.24 mg/dL   Calcium 9.6 8.9 - 98.110.3 mg/dL   Total Protein 7.5 6.5 - 8.1 g/dL   Albumin 4.4 3.5 - 5.0 g/dL   AST 30 15 - 41 U/L   ALT 59 (H) 0 - 44 U/L   Alkaline Phosphatase 68 38 - 126 U/L   Total Bilirubin 0.3 0.3 - 1.2 mg/dL   GFR, Estimated >19>60 >14>60 mL/min    Comment: (NOTE) Calculated using the CKD-EPI Creatinine Equation (2021)    Anion gap 8 5 - 15    Comment: Performed at [facility], [address] [place], [place] [address]  Ethanol     Status: None   Collection Time: 08/20/20  1:52 AM  Result Value Ref Range   Alcohol, Ethyl (B) <10 <10 mg/dL    Comment: (NOTE) Lowest detectable limit for serum alcohol is 10 mg/dL.  For medical purposes only. Performed at [facility], [address] [place], [place] [address]   CBC with Diff     Status: Abnormal   Collection Time: 08/20/20  1:52 AM  Result Value Ref Range   WBC 8.1 4.0 - 10.5  K/uL   RBC 4.64 4.22 - 5.81 MIL/uL   Hemoglobin 12.8 (L) 13.0 - 17.0 g/dL   HCT 86.540.4 78.439.0 - 69.652.0 %   MCV 87.1 80.0 - 100.0 fL   MCH 27.6 26.0 - 34.0 pg   MCHC 31.7 30.0 - 36.0 g/dL   RDW 29.514.3 28.411.5 - 13.215.5 %   Platelets 335 150 - 400 K/uL   nRBC 0.0 0.0 - 0.2 %   Neutrophils Relative % 57 %   Neutro Abs 4.6 1.7 - 7.7 K/uL   Lymphocytes Relative 30 %   Lymphs Abs 2.5 0.7 - 4.0 K/uL   Monocytes Relative 10 %   Monocytes Absolute 0.8 0.1 - 1.0 K/uL   Eosinophils Relative 2 %   Eosinophils Absolute 0.1 0.0 - 0.5 K/uL   Basophils Relative 1 %   Basophils Absolute 0.0 0.0 - 0.1 K/uL   Immature Granulocytes 0 %   Abs Immature Granulocytes 0.03 0.00 - 0.07 K/uL    Comment: Performed at [facility], [address] [place], [place] [address]  Acetaminophen level     Status: Abnormal   Collection Time: 08/20/20  1:52 AM  Result Value Ref Range   Acetaminophen (Tylenol), Serum <10 (L) 10 - 30 ug/mL    Comment: (NOTE) Therapeutic concentrations vary significantly. A range of 10-30 ug/mL  may be an effective concentration for many patients. However, some  are best treated at concentrations outside of this range. Acetaminophen concentrations >150 ug/mL at 4 hours after ingestion  and >50 ug/mL at 12 hours after ingestion are often associated with  toxic reactions.  Performed at [facility], [address] [place], [place] [address]   Salicylate level     Status: Abnormal   Collection Time: 08/20/20  1:52 AM  Result Value Ref Range   Salicylate Lvl <7.0 (L) 7.0 - 30.0 mg/dL    Comment: Performed at [facility], [address] [place], [place] [address]  POC CBG, ED     Status: None   Collection Time: 08/20/20  5:39 AM  Result Value Ref Range  Glucose-Capillary 81 70 - 99 mg/dL    Comment: Glucose reference range applies only to samples taken after fasting for at least 8 hours.    Medications:  Current Facility-Administered Medications  Medication Dose Route  Frequency Provider Last Rate Last Admin   acetaminophen (TYLENOL) tablet 650 mg  650 mg Oral Q4H PRN McDonald, Mia A, PA-C       alum & mag hydroxide-simeth (MAALOX/MYLANTA) 200-200-20 MG/5ML suspension 30 mL  30 mL Oral Q6H PRN McDonald, Mia A, PA-C       hydrOXYzine (ATARAX/VISTARIL) tablet 50 mg  50 mg Oral Q6H PRN Ladona Ridgel, Cody W, PA-C       nicotine (NICODERM CQ - dosed in mg/24 hours) patch 21 mg  21 mg Transdermal Daily McDonald, Mia A, PA-C   21 mg at 08/20/20 1045   OLANZapine (ZYPREXA) tablet 20 mg  20 mg Oral QHS Ladona Ridgel, Cody W, PA-C       ondansetron [facility]) tablet 4 mg  4 mg Oral Q8H PRN McDonald, Mia A, PA-C       Current Outpatient Medications  Medication Sig Dispense Refill   hydrOXYzine (ATARAX/VISTARIL) 50 MG tablet Take 50 mg by mouth every 6 (six) hours as needed for anxiety.     OLANZapine (ZYPREXA) 20 MG tablet Take 20 mg by mouth at bedtime.     QUEtiapine (SEROQUEL) 400 MG tablet Take 400 mg by mouth at bedtime. (Patient not taking: No sig reported)     ziprasidone (GEODON) 60 MG capsule Take 60 mg by mouth daily. (Patient not taking: Reported on 08/20/2020)      Musculoskeletal: Strength & Muscle Tone: within normal limits Gait & Station: normal Patient leans: N/A  Psychiatric Specialty Exam:  Presentation  General Appearance: Appropriate for Environment  Eye Contact:Good  Speech:Pressured  Speech Volume:Normal  Handedness:Right   Mood and Affect  Mood:Labile  Affect:Labile   Thought Process  Thought Processes:Disorganized  Descriptions of Associations:Tangential  Orientation:Full (Time, Place and Person)  Thought Content:Delusions; Paranoid Ideation; Tangential  History of Schizophrenia/Schizoaffective disorder:No  Duration of Psychotic Symptoms:Less than six months  Hallucinations:Hallucinations: Auditory  Ideas of Reference:Delusions  Suicidal Thoughts:Suicidal Thoughts: No  Homicidal Thoughts:Homicidal Thoughts: No   Sensorium   Memory:Immediate Fair; Recent Fair  Judgment:Intact  Insight:Poor   Executive Functions  Concentration:Poor  Attention Span:Poor  Recall:Fair  Fund of Knowledge:Fair  Language:Fair   Psychomotor Activity  Psychomotor Activity:Psychomotor Activity: Restlessness   Assets  Assets:Communication Skills; Desire for Improvement; Resilience   Sleep  Sleep:Sleep: Poor    Physical Exam: Physical Exam Vitals and nursing note reviewed. Exam conducted with a chaperone present.  Constitutional:      General: He is not in acute distress.    Appearance: Normal appearance. He is not ill-appearing.  Cardiovascular:     Rate and Rhythm: Normal rate.  Pulmonary:     Effort: Pulmonary effort is normal.  Neurological:     Mental Status: He is alert and oriented to person, place, and time.  Psychiatric:        Mood and Affect: Mood is anxious. Affect is labile.        Speech: Speech is rapid and pressured.        Behavior: Behavior is agitated and hyperactive.        Thought Content: Thought content is paranoid and delusional.        Judgment: Judgment is impulsive.   Review of Systems  Constitutional: Negative.   HENT: Negative.    Eyes: Negative.  Respiratory: Negative.    Cardiovascular: Negative.   Gastrointestinal: Negative.   Genitourinary: Negative.   Musculoskeletal: Negative.   Skin: Negative.   Neurological: Negative.   Endo/Heme/Allergies: Negative.   Psychiatric/Behavioral:  Positive for hallucinations and substance abuse (THC). Negative for suicidal ideas. The patient is not nervous/anxious.   Blood pressure (!) 152/82, pulse 78, temperature 97.8 F (36.6 C), temperature source Oral, resp. rate (!) 21, SpO2 100 %. There is no height or weight on file to calculate BMI.  Treatment Plan Summary: Daily contact with patient to assess and evaluate symptoms and progress in treatment, Medication management, and Plan Inpatient psychiatric treatment  Disposition:  Recommend psychiatric Inpatient admission when medically cleared.  This service was provided via telemedicine using a 2-way, interactive audio and video technology.  Names of all persons participating in this telemedicine service and their role in this encounter. Name: Assunta Found Role: NP  Name: Dr. Nelly Rout Role: Psychiatrist  Name: Derrick Joseph Role: Patient  Name: [place] Nickerson, RN Role: Patient's nurse sent a secure message informing:  Psychiatric consult complete.  Patient recommended for inpatient psychiatric treatment. Inform MD only default listed.     Lesli Issa, NP 08/20/2020 12:15 PM

## 2020-08-20 NOTE — Discharge Instructions (Addendum)
Patient to be transferred to [facility] ED for further observation and safety monitoring while inpatient psychiatric treatment is sought for the patient while patient is in the ED.

## 2020-08-20 NOTE — ED Notes (Addendum)
Pt very manic, yelling in the room about calling the police and taking his mom to court for his things. Pt not making much since with his words. PT took PRN Vistaril but may still needs meds. MD messaged for IM meds

## 2020-08-20 NOTE — ED Notes (Signed)
REPORT CALLED TO WOODY [email or web address]  [facility] [employer]

## 2020-08-20 NOTE — ED Notes (Signed)
Lunch delivered to pt at this time.

## 2020-08-20 NOTE — ED Notes (Signed)
PT given his RX glasses. PT also provided with food and drink as requested

## 2020-08-20 NOTE — ED Notes (Signed)
Patient has become increasingly agitated due to other behaviors of other patient in unit; pt continues to yell and threaten staff; Patient asked several times to go in room but will not comply; EDP notified and IM meds ordered-Monique,RN

## 2020-08-20 NOTE — ED Notes (Signed)
Pt continuing to stand in doorway of room - pt demonstrating flight of ideas

## 2020-08-20 NOTE — Progress Notes (Signed)
Patient information has been sent to [facility] via secure chat to review for potential admission. Patient meets inpatient criteria per Melbourne Abts, PA-C.   Situation ongoing, CSW will continue to monitor progress.    Signed:  Damita Dunnings, MSW, LCSW-A  08/20/2020 10:45 AM

## 2020-08-20 NOTE — ED Notes (Signed)
Mother called front desk and she was called back and updated. She missed 2 calls from her son and called back. Pt is in the room yelling about "I own all of [place] and have the biggest house in [place]. I will pay all of yall to get me out of here. I pick my queen and she will never want for anything."

## 2020-08-21 MED ORDER — LORAZEPAM 1 MG PO TABS
2.0000 mg | ORAL_TABLET | Freq: Once | ORAL | Status: AC
  Administered 2020-08-21: 2 mg via ORAL
  Filled 2020-08-21: qty 2

## 2020-08-21 NOTE — ED Provider Notes (Addendum)
Emergency Medicine Observation Re-evaluation Note  Derrick Joseph is a 20 y.o. male, seen on rounds today.  Pt initially presented to the ED for complaints of IVC and Manic Behavior Currently, the patient is coloring furiously in bed.  Is pleasant but quite visibly demonstrating some symptoms of mania including some pressured speech and repetitive behaviors.  Physical Exam  BP 130/68   Pulse 90   Temp 97.6 F (36.4 C) (Oral)   Resp 18   SpO2 100%  Physical Exam CONSTITUTIONAL:  well-appearing, NAD NEURO:  Alert and oriented x 3, no focal deficits EYES:  pupils equal and reactive ENT/NECK:  trachea midline, no JVD CARDIO:  reg rate, reg rhythm, well-perfused PULM:  None labored breathing GI/GU:  Abdomen non-distended MSK/SPINE:  No gross deformities, no edema SKIN:  no rash obvious, atraumatic, no ecchymosis  PSYCH: Pressured speech  ED Course / MDM  EKG:EKG Interpretation  Date/Time:  Tuesday August 20 2020 01:29:06 EDT Ventricular Rate:  79 PR Interval:  142 QRS Duration: 80 QT Interval:  352 QTC Calculation: 403 R Axis:   75 Text Interpretation: Normal sinus rhythm Normal ECG No significant change was found Confirmed by Glynn Octave [phone]) on 08/20/2020 1:43:51 AM  I have reviewed the labs performed to date as well as medications administered while in observation.  Recent changes in the last 24 hours include none.  Plan  Current plan is for INPT treatment.  Derrick Joseph is under involuntary commitment.   Provide patient with 2 mg of p.o. Ativan.  Offered IM injection which she declined.  Patient is mentating well enough to make the signs of decisions with himself.  I do not feel that patient needs IM medicine at this time however is quite agitated and this may change in the near future.  Patient is medically cleared and awaiting inpatient psychiatric placement.   Gailen Shelter, [place] 08/21/20 0957    Gailen Shelter, PA 08/21/20 1001    Gwyneth Sprout,  MD 08/21/20 1400

## 2020-08-21 NOTE — ED Notes (Signed)
Pt in his room yelling and banging on the door. Pt has COVID and was told to stay in the room. Pt currently just yelling things out at staff about being homeless and needing to get out of here. Will continue to monitor.

## 2020-08-21 NOTE — ED Notes (Signed)
Pt currently in the bathroom, yelling to leave him alone and not to mess with him. Pt beginning to disturb the other pt. This RN asked pt to lower his voice and to finish using the restroom so he could return to his room. Pt stated "Yes ma'am" but then continues to yell out random stuff in the bathroom.

## 2020-08-21 NOTE — ED Notes (Signed)
PT given pain medication for L sided musculoskeletal pain.  While taking meds, pt stated he no longer had pain.

## 2020-08-21 NOTE — ED Notes (Signed)
Pt currently showering.

## 2020-08-21 NOTE — Consult Note (Signed)
Telepsych Consultation   Reason for Consult:  Manic behavior Referring Physician:  Steve Rattler Location of Patient: [facility] ED Location of Provider: Other: [facility]  Patient Identification: Robben Jagiello MRN:  [id number] Principal Diagnosis: Bipolar disorder, current episode manic without psychotic features (HCC) Diagnosis:  Principal Problem:   Bipolar disorder, current episode manic without psychotic features (HCC) Active Problems:   Cannabis use disorder, moderate, in controlled environment (HCC)   Total Time spent with patient: 30 minutes  Subjective:   Erle Guster is a 20 y.o. male patient admitted to [facility] ED after being sent from [facility] where he initially presented via sheriff's deputy with complaints manic behavior  Psychiatric consult Reassessment 08/21/20 Marge Duncans, 20 y.o., male patient seen via tele health by this provider, consulted with Dr. Nelly Rout; and chart reviewed on 08/21/20.  On evaluation Geovany Trudo reports he is doing fine.  Patient is sitting on end of bed with towel wrapped around his head.  At beginning of assessment patient states "Let me take my crown off so I can talk with you."  Patient then removes the towel from his head and places it on bed.  Patient states that sitter is a specialist and is his father.  "I'm going to be adopted by him."  Patient has papers on bedside table and on floor that he has colored.  He holds up to papers one a picture of a soldier and the other a Humvee and states "This is where I'm going to the [employer].  So, I need to get out of her."  Patient reports he is eating and sleeping without difficulty and tolerating his medications without adverse reaction.  Patient denies suicidal/homicidal ideation, psychosis, and paranoia.     During evaluation:  Patient continues to be oriented x 4, alert, and he remains calm and cooperative throughout assessment.  His mood is tangential.  He continues to present with delusional thinking  and manic behavior and he continues to deny suicidal/self-harm/homicidal ideation, psychosis, and paranoia.   Spoke with sitter who informs patient behavior has been cooperative; but patient likes to take non stop.  States patient can be voiceterous at times but there has been no violence.     Psychiatric consult 08/20/20 HPI:  Castiel Lauricella, 20 y.o., male patient seen via tele health by this provider, consulted with Dr. Nelly Rout; and chart reviewed on 08/21/20.  On evaluation Azaryah Oleksy reports he doesn't need to stay in the hospital and "Would like to be escorted to my house or yacht."  Patient state he needs to be alone.  Patient denies suicidal ideation "Never".  Patient also denies homicidal ideation and auditory/visual hallucinations.  During evaluation Nikolus Marczak is sitting on side of bed in no acute distress.  He is alert, oriented x 4, calm and cooperative.  His mood is anxious and labile with congruent affect.  He continues to present disorganized, paranoid, and delusional thinking; although he denies psychosis and paranoia.  Patient also denies suicidal/self-harm/homicidal ideation.     Past Psychiatric History: Bipolar disorder   Past Medical History: History reviewed. No pertinent past medical history. History reviewed. No pertinent surgical history. Family History: History reviewed. No pertinent family history. Family Psychiatric  History: Unaware Social History:  Social History   Substance and Sexual Activity  Alcohol Use None     Social History   Substance and Sexual Activity  Drug Use Not on file    Social History   Socioeconomic History  Marital status: Single    Spouse name: Not on file   Number of children: Not on file   Years of education: Not on file   Highest education level: Not on file  Occupational History   Not on file  Tobacco Use   Smoking status: Not on file   Smokeless tobacco: Not on file  Substance and Sexual Activity   Alcohol use:  Not on file   Drug use: Not on file   Sexual activity: Not on file  Other Topics Concern   Not on file  Social History Narrative   Not on file   Social Determinants of Health   Financial Resource Strain: Not on file  Food Insecurity: Not on file  Transportation Needs: Not on file  Physical Activity: Not on file  Stress: Not on file  Social Connections: Not on file   Additional Social History:    Allergies:  No Known Allergies  Labs:  Results for orders placed or performed during the hospital encounter of 08/20/20 (from the past 48 hour(s))  Resp Panel by RT-PCR (Flu A&B, Covid) Nasopharyngeal Swab     Status: Abnormal   Collection Time: 08/20/20  1:27 AM   Specimen: Nasopharyngeal Swab; Nasopharyngeal(NP) swabs in vial transport medium  Result Value Ref Range   SARS Coronavirus 2 by RT PCR POSITIVE (A) NEGATIVE    Comment: RESULT CALLED TO, READ BACK BY AND VERIFIED WITH: Ladora DanielMARY SEELEA RN 08/20/2020 @0936  BY JW (NOTE) SARS-CoV-2 target nucleic acids are DETECTED.  The SARS-CoV-2 RNA is generally detectable in upper respiratory specimens during the acute phase of infection. Positive results are indicative of the presence of the identified virus, but do not rule out bacterial infection or co-infection with other pathogens not detected by the test. Clinical correlation with patient history and other diagnostic information is necessary to determine patient infection status. The expected result is Negative.  Fact Sheet for Patients: BloggerCourse.comhttps://www.fda.gov/media/152166/download  Fact Sheet for Healthcare Providers: SeriousBroker.ithttps://www.fda.gov/media/152162/download  This test is not yet approved or cleared by the [place] FDA and  has been authorized for detection and/or diagnosis of SARS-CoV-2 by FDA under an Emergency Use Authorization (EUA).  This EUA will remain in effect (meaning this test can  be used) for the duration of  the COVID-19 declaration under Section 564(b)(1) of  the Act, 21 U.S.C. section 360bbb-3(b)(1), unless the authorization is terminated or revoked sooner.     Influenza A by PCR NEGATIVE NEGATIVE   Influenza B by PCR NEGATIVE NEGATIVE    Comment: (NOTE) The Xpert Xpress SARS-CoV-2/FLU/RSV plus assay is intended as an aid in the diagnosis of influenza from Nasopharyngeal swab specimens and should not be used as a sole basis for treatment. Nasal washings and aspirates are unacceptable for Xpert Xpress SARS-CoV-2/FLU/RSV testing.  Fact Sheet for Patients: BloggerCourse.comhttps://www.fda.gov/media/152166/download  Fact Sheet for Healthcare Providers: SeriousBroker.ithttps://www.fda.gov/media/152162/download  This test is not yet approved or cleared by the [place] FDA and has been authorized for detection and/or diagnosis of SARS-CoV-2 by FDA under an Emergency Use Authorization (EUA). This EUA will remain in effect (meaning this test can be used) for the duration of the COVID-19 declaration under Section 564(b)(1) of the Act, 21 U.S.C. section 360bbb-3(b)(1), unless the authorization is terminated or revoked.  Performed at [facility], [address] [place], [place] [id number]   Urine rapid drug screen (hosp performed)     Status: Abnormal   Collection Time: 08/20/20  1:27 AM  Result Value Ref Range  Opiates NONE DETECTED NONE DETECTED   Cocaine NONE DETECTED NONE DETECTED   Benzodiazepines NONE DETECTED NONE DETECTED   Amphetamines NONE DETECTED NONE DETECTED   Tetrahydrocannabinol POSITIVE (A) NONE DETECTED   Barbiturates NONE DETECTED NONE DETECTED    Comment: (NOTE) DRUG SCREEN FOR MEDICAL PURPOSES ONLY.  IF CONFIRMATION IS NEEDED FOR ANY PURPOSE, NOTIFY LAB WITHIN 5 DAYS.  LOWEST DETECTABLE LIMITS FOR URINE DRUG SCREEN Drug Class                     Cutoff (ng/mL) Amphetamine and metabolites    1000 Barbiturate and metabolites    200 Benzodiazepine                 200 Tricyclics and metabolites     300 Opiates and metabolites         300 Cocaine and metabolites        300 THC                            50 Performed at [facility], [address] [place], [place] [address]   Comprehensive metabolic panel     Status: Abnormal   Collection Time: 08/20/20  1:52 AM  Result Value Ref Range   Sodium 140 135 - 145 mmol/L   Potassium 3.8 3.5 - 5.1 mmol/L   Chloride 104 98 - 111 mmol/L   CO2 28 22 - 32 mmol/L   Glucose, Bld 52 (L) 70 - 99 mg/dL    Comment: Glucose reference range applies only to samples taken after fasting for at least 8 hours.   BUN 15 6 - 20 mg/dL   Creatinine, Ser 0.96 0.61 - 1.24 mg/dL   Calcium 9.6 8.9 - 28.3 mg/dL   Total Protein 7.5 6.5 - 8.1 g/dL   Albumin 4.4 3.5 - 5.0 g/dL   AST 30 15 - 41 U/L   ALT 59 (H) 0 - 44 U/L   Alkaline Phosphatase 68 38 - 126 U/L   Total Bilirubin 0.3 0.3 - 1.2 mg/dL   GFR, Estimated >66 >29 mL/min    Comment: (NOTE) Calculated using the CKD-EPI Creatinine Equation (2021)    Anion gap 8 5 - 15    Comment: Performed at [facility], [address], [place], [place] [address]  Ethanol     Status: None   Collection Time: 08/20/20  1:52 AM  Result Value Ref Range   Alcohol, Ethyl (B) <10 <10 mg/dL    Comment: (NOTE) Lowest detectable limit for serum alcohol is 10 mg/dL.  For medical purposes only. Performed at [facility], [address], [place], [place] [address]   CBC with Diff     Status: Abnormal   Collection Time: 08/20/20  1:52 AM  Result Value Ref Range   WBC 8.1 4.0 - 10.5 K/uL   RBC 4.64 4.22 - 5.81 MIL/uL   Hemoglobin 12.8 (L) 13.0 - 17.0 g/dL   HCT 46.5 68.1 - 27.5 %   MCV 87.1 80.0 - 100.0 fL   MCH 27.6 26.0 - 34.0 pg   MCHC 31.7 30.0 - 36.0 g/dL   RDW 17.0 01.7 - 49.4 %   Platelets 335 150 - 400 K/uL   nRBC 0.0 0.0 - 0.2 %   Neutrophils Relative % 57 %   Neutro Abs 4.6 1.7 - 7.7 K/uL   Lymphocytes Relative 30 %   Lymphs Abs  2.5 0.7 - 4.0 K/uL   Monocytes Relative 10 %   Monocytes Absolute 0.8 0.1 - 1.0 K/uL    Eosinophils Relative 2 %   Eosinophils Absolute 0.1 0.0 - 0.5 K/uL   Basophils Relative 1 %   Basophils Absolute 0.0 0.0 - 0.1 K/uL   Immature Granulocytes 0 %   Abs Immature Granulocytes 0.03 0.00 - 0.07 K/uL    Comment: Performed at [facility], [address] [place], [place] [address]  Acetaminophen level     Status: Abnormal   Collection Time: 08/20/20  1:52 AM  Result Value Ref Range   Acetaminophen (Tylenol), Serum <10 (L) 10 - 30 ug/mL    Comment: (NOTE) Therapeutic concentrations vary significantly. A range of 10-30 ug/mL  may be an effective concentration for many patients. However, some  are best treated at concentrations outside of this range. Acetaminophen concentrations >150 ug/mL at 4 hours after ingestion  and >50 ug/mL at 12 hours after ingestion are often associated with  toxic reactions.  Performed at [facility], [address] [place], [place] [address]   Salicylate level     Status: Abnormal   Collection Time: 08/20/20  1:52 AM  Result Value Ref Range   Salicylate Lvl <7.0 (L) 7.0 - 30.0 mg/dL    Comment: Performed at [facility], [address] [place], [place] [address]  POC CBG, ED     Status: None   Collection Time: 08/20/20  5:39 AM  Result Value Ref Range   Glucose-Capillary 81 70 - 99 mg/dL    Comment: Glucose reference range applies only to samples taken after fasting for at least 8 hours.    Medications:  Current Facility-Administered Medications  Medication Dose Route Frequency Provider Last Rate Last Admin   acetaminophen (TYLENOL) tablet 650 mg  650 mg Oral Q4H PRN McDonald, Mia A, PA-C       alum & mag hydroxide-simeth (MAALOX/MYLANTA) 200-200-20 MG/5ML suspension 30 mL  30 mL Oral Q6H PRN McDonald, Mia A, PA-C       hydrOXYzine (ATARAX/VISTARIL) tablet 50 mg  50 mg Oral Q6H PRN Melbourne Abts W, PA-C   50 mg at 08/20/20 2201   nicotine (NICODERM CQ - dosed in mg/24 hours) patch 21 mg  21 mg Transdermal Daily McDonald,  Mia A, PA-C   21 mg at 08/21/20 1019   OLANZapine (ZYPREXA) tablet 20 mg  20 mg Oral QHS Ladona Ridgel, Cody W, PA-C   20 mg at 08/20/20 2201   ondansetron (ZOFRAN) tablet 4 mg  4 mg Oral Q8H PRN McDonald, Mia A, PA-C       Current Outpatient Medications  Medication Sig Dispense Refill   hydrOXYzine (ATARAX/VISTARIL) 50 MG tablet Take 50 mg by mouth every 6 (six) hours as needed for anxiety.     OLANZapine (ZYPREXA) 20 MG tablet Take 20 mg by mouth at bedtime.     QUEtiapine (SEROQUEL) 400 MG tablet Take 400 mg by mouth at bedtime. (Patient not taking: No sig reported)     ziprasidone (GEODON) 60 MG capsule Take 60 mg by mouth daily. (Patient not taking: Reported on 08/20/2020)      Musculoskeletal: Strength & Muscle Tone: within normal limits Gait & Station: normal Patient leans: N/A  Psychiatric Specialty Exam:  Presentation  General Appearance: Appropriate for Environment  Eye Contact:Good  Speech:Clear and Coherent; Pressured  Speech Volume:Normal  Handedness:Right   Mood and Affect  Mood:Labile  Affect:Labile   Thought  Process  Thought Processes:Coherent; Linear  Descriptions of Associations:Tangential  Orientation:Full (Time, Place and Person)  Thought Content:Tangential; Delusions  History of Schizophrenia/Schizoaffective disorder:No  Duration of Psychotic Symptoms:Less than six months  Hallucinations:Hallucinations: Auditory  Ideas of Reference:Delusions  Suicidal Thoughts:Suicidal Thoughts: No  Homicidal Thoughts:Homicidal Thoughts: No   Sensorium  Memory:Immediate Fair; Recent Fair  Judgment:Fair  Insight:Fair   Executive Functions  Concentration:Poor  Attention Span:Poor  Recall:Fair  Fund of Knowledge:Fair  Language:Fair   Psychomotor Activity  Psychomotor Activity:Psychomotor Activity: Restlessness   Assets  Assets:Communication Skills; Desire for Improvement; Resilience   Sleep  Sleep:Sleep: Fair    Physical  Exam: Physical Exam Vitals and nursing note reviewed. Exam conducted with a chaperone present.  Constitutional:      General: He is not in acute distress.    Appearance: Normal appearance. He is not ill-appearing.  Cardiovascular:     Rate and Rhythm: Normal rate.  Pulmonary:     Effort: Pulmonary effort is normal.  Neurological:     Mental Status: He is alert and oriented to person, place, and time.  Psychiatric:        Mood and Affect: Mood is anxious. Affect is labile.        Speech: Speech is rapid and pressured.        Behavior: Behavior is agitated and hyperactive.        Thought Content: Thought content is paranoid and delusional.        Judgment: Judgment is impulsive.   Review of Systems  Constitutional: Negative.   HENT: Negative.    Eyes: Negative.   Respiratory: Negative.    Cardiovascular: Negative.   Gastrointestinal: Negative.   Genitourinary: Negative.   Musculoskeletal: Negative.   Skin: Negative.   Neurological: Negative.   Endo/Heme/Allergies: Negative.   Psychiatric/Behavioral:  Positive for hallucinations and substance abuse (THC). Negative for suicidal ideas. The patient is not nervous/anxious.   Blood pressure 130/68, pulse 90, temperature 97.6 F (36.4 C), temperature source Oral, resp. rate 18, SpO2 100 %. There is no height or weight on file to calculate BMI.  Treatment Plan Summary: Daily contact with patient to assess and evaluate symptoms and progress in treatment, Medication management, and Plan Inpatient psychiatric treatment  Medication management:   Restarted on home medications  nicotine  21 mg Transdermal Daily   OLANZapine  20 mg Oral QHS   Vistaril 50 mg Q 6 hrs prn anxiety  Disposition: Recommend psychiatric Inpatient admission when medically cleared.  This service was provided via telemedicine using a 2-way, interactive audio and video technology.  Names of all persons participating in this telemedicine service and their role in  this encounter. Name: Assunta Found Role: NP  Name: Dr. Nelly Rout Role: Psychiatrist  Name: Marge Duncans Role: Patient  Name: Adonis Brook, RN Role: Patient's nurse sent a secure message informing:  Psychiatric consult complete.  Patient recommended for inpatient psychiatric treatment. COVID +; Will continue to monitor and treat while awaiting appropriate bed for inpatient psychiatric treatment.  If no beds available at [facility] will fax to surrounding facilities for appropriate bed.  Inform MD only default listed.     Zareth Rippetoe, NP 08/21/2020 11:57 AM

## 2020-08-22 MED ORDER — STERILE WATER FOR INJECTION IJ SOLN
INTRAMUSCULAR | Status: AC
  Filled 2020-08-22: qty 10

## 2020-08-22 MED ORDER — ZIPRASIDONE MESYLATE 20 MG IM SOLR
20.0000 mg | Freq: Once | INTRAMUSCULAR | Status: AC
  Administered 2020-08-22: 20 mg via INTRAMUSCULAR
  Filled 2020-08-22: qty 20

## 2020-08-22 NOTE — ED Notes (Signed)
Patient taking shower.

## 2020-08-22 NOTE — Consult Note (Signed)
Telepsych Consultation   Reason for Consult: Patient was assessed by provider and TTS at [facility] prior to patient being transferred to the ED. Inpatient psychiatric treatment recommended for the patient. SW to seek placement.  Referring Physician:  Steve Rattler Location of Patient: [facility] Location of Provider: Other: [facility]  Patient Identification: Deren Degrazia MRN:  [id number] Principal Diagnosis: Bipolar disorder, current episode manic without psychotic features (HCC) Diagnosis:  Principal Problem:   Bipolar disorder, current episode manic without psychotic features (HCC) Active Problems:   Cannabis use disorder, moderate, in controlled environment (HCC)   Total Time spent with patient: 30 minutes  Subjective:   Ehsan Corvin is a 20 y.o. male patient admitted to [facility] Emergency Department with complaints of manic behaviors from the [facility].  Patient seen via tele health by this provider for a reassessment; chart reviewed and consulted with Dr. Lucianne Muss on 08/22/20.  On evaluation, patient is sitting on the edge of the bed facing the camera. He is alert and oriented x 4.   HPI: The patient presented to the [facility] Emergency Department after being sent from the [facility] where he initially presented via sheriff's deputy with complaints of manic behaviors. Today, the patient states "they caused me to black out and I woke up upstairs. I am going to sue. I can call the deputy and they will take me where I need to go. I have COVID-19 and I can quarantined in a motel. I am trying to figure out why I cannot discharge to stay with my mother. She probably has COVID too."  When asked what brought him in to be evaluated, he states that he called the police on himself and that his mom called the police too. He states that his mother has a different spirit and "God is in the room with me."  He then proceeds to hold up a  picture of a man in a uniform with the [place], and states that he is going to move to [place] when he gets out to join [employer]. He denies suicidal ideations, and states "never." He denies homicidal ideations. He denies auditory and visual hallucinations and states that he does not have schizophrenia. He denies feeling paranoid like someone is out to get him and states that he has private security and the FBI. He states, "why would I be worried when I am the [place] of [place]."  He reports tolerating the Zyprexa at bedtime without any side effects. He presents with grandiose delusions, disorganized thought process, is hyper religious, and has pressured rapid speech.   Past Psychiatric History: Patient has a hx of bipolar dx.   Past Medical History: History reviewed. No pertinent past medical history. History reviewed. No pertinent surgical history. Family History: History reviewed. No pertinent family history. Family Psychiatric  History: Patient is unaware of a family hx of mental illness.  Social History: Patient reports daily marijuana use.  Social History   Substance and Sexual Activity  Alcohol Use None     Social History   Substance and Sexual Activity  Drug Use Not on file    Social History   Socioeconomic History   Marital status: Single    Spouse name: Not on file   Number of children: Not on file   Years of education: Not on file   Highest education level: Not on file  Occupational History   Not on file  Tobacco  Use   Smoking status: Not on file   Smokeless tobacco: Not on file  Substance and Sexual Activity   Alcohol use: Not on file   Drug use: Not on file   Sexual activity: Not on file  Other Topics Concern   Not on file  Social History Narrative   Not on file   Social Determinants of Health   Financial Resource Strain: Not on file  Food Insecurity: Not on file  Transportation Needs: Not on file  Physical Activity: Not on file  Stress: Not  on file  Social Connections: Not on file   Additional Social History:    Allergies:  No Known Allergies  Labs: No results found for this or any previous visit (from the past 48 hour(s)).  Medications:  Current Facility-Administered Medications  Medication Dose Route Frequency Provider Last Rate Last Admin   acetaminophen (TYLENOL) tablet 650 mg  650 mg Oral Q4H PRN McDonald, Mia A, PA-C   650 mg at 08/21/20 1757   alum & mag hydroxide-simeth (MAALOX/MYLANTA) 200-200-20 MG/5ML suspension 30 mL  30 mL Oral Q6H PRN McDonald, Mia A, PA-C   30 mL at 08/22/20 1141   hydrOXYzine (ATARAX/VISTARIL) tablet 50 mg  50 mg Oral Q6H PRN Melbourne Abts W, PA-C   50 mg at 08/22/20 1141   nicotine (NICODERM CQ - dosed in mg/24 hours) patch 21 mg  21 mg Transdermal Daily McDonald, Mia A, PA-C   21 mg at 08/21/20 1019   OLANZapine (ZYPREXA) tablet 20 mg  20 mg Oral QHS Ladona Ridgel, Cody W, PA-C   20 mg at 08/21/20 2240   ondansetron (ZOFRAN) tablet 4 mg  4 mg Oral Q8H PRN McDonald, Mia A, PA-C       Current Outpatient Medications  Medication Sig Dispense Refill   hydrOXYzine (ATARAX/VISTARIL) 50 MG tablet Take 50 mg by mouth every 6 (six) hours as needed for anxiety.     OLANZapine (ZYPREXA) 20 MG tablet Take 20 mg by mouth at bedtime.     QUEtiapine (SEROQUEL) 400 MG tablet Take 400 mg by mouth at bedtime. (Patient not taking: No sig reported)     ziprasidone (GEODON) 60 MG capsule Take 60 mg by mouth daily. (Patient not taking: Reported on 08/20/2020)      Psychiatric Specialty Exam:  Presentation  General Appearance: Appropriate for Environment  Eye Contact:Fair  Speech:Clear and Coherent  Speech Volume:Normal  Handedness:Right   Mood and Affect  Mood:Anxious  Affect:Congruent  Thought Process  Thought Processes:Irrevelant; Coherent  Descriptions of Associations:Tangential  Orientation:Full (Time, Place and Person)  Thought Content:Delusions; Tangential  History of  Schizophrenia/Schizoaffective disorder:No  Duration of Psychotic Symptoms:Less than six months  Hallucinations:Hallucinations: None  Ideas of Reference:Delusions  Suicidal Thoughts:Suicidal Thoughts: No  Homicidal Thoughts:Homicidal Thoughts: No   Sensorium  Memory:Immediate Fair; Recent Fair; Remote Fair  Judgment:Intact  Insight:Present   Executive Functions  Concentration:Poor  Attention Span:Poor  Recall:Fair  Fund of Knowledge:Fair  Language:Fair   Psychomotor Activity  Psychomotor Activity:Psychomotor Activity: Restlessness   Assets  Assets:Communication Skills; Leisure Time; Physical Health; Social Support   Sleep  Sleep:Sleep: Fair Number of Hours of Sleep: 7    Physical Exam: Physical Exam Cardiovascular:     Rate and Rhythm: Normal rate.  Pulmonary:     Effort: Pulmonary effort is normal.  Neurological:     Mental Status: He is alert.   Review of Systems  Constitutional: Negative.   HENT: Negative.    Eyes: Negative.   Respiratory: Negative.  Cardiovascular: Negative.   Gastrointestinal: Negative.   Genitourinary: Negative.   Musculoskeletal: Negative.   Skin: Negative.   Neurological: Negative.   Endo/Heme/Allergies: Negative.   Psychiatric/Behavioral:  Negative for depression and suicidal ideas.   Blood pressure 135/86, pulse 80, temperature 98.2 F (36.8 C), resp. rate 20, SpO2 100 %. There is no height or weight on file to calculate BMI.  Treatment Plan Summary: Daily contact with patient to assess and evaluate symptoms and progress in treatment, Medication management, and Plan Patient is recommended for inpatient treatment.   Continue medication regimen: Zyprexa 20 mg po at bedtime for psychosis and mood stabilization   Labs including EKG reviewed.  Disposition:  Recommend psychiatric Inpatient admission when medically cleared. This service was provided via telemedicine using a 2-way, interactive audio and video  technology.  Names of all persons participating in this telemedicine service and their role in this encounter. Name: Marge Duncans  Role: Patient   Name: Liborio Nixon  Role: NP  Name: Dr. Lucianne Muss  Role: Psychiatrist   Name:  Role:     Layla Barter, NP 08/22/2020 1:26 PM

## 2020-08-22 NOTE — ED Provider Notes (Signed)
Emergency Medicine Observation Re-evaluation Note  Derrick Joseph is a 20 y.o. male, seen on rounds today.  Pt initially presented to the ED for complaints of IVC and Manic Behavior Currently, the patient is NAD, Resting comfortably at 0750.  Physical Exam  BP 122/77 (BP Location: Left Arm)   Pulse 95   Temp 98.3 F (36.8 C)   Resp 14   SpO2 100%  Physical Exam General: NAD, Resting comfortably   ED Course / MDM  EKG:EKG Interpretation  Date/Time:  Tuesday August 20 2020 01:29:06 EDT Ventricular Rate:  79 PR Interval:  142 QRS Duration: 80 QT Interval:  352 QTC Calculation: 403 R Axis:   75 Text Interpretation: Normal sinus rhythm Normal ECG No significant change was found Confirmed by Glynn Octave [phone]) on 08/20/2020 1:43:51 AM  I have reviewed the labs performed to date as well as medications administered while in observation.  Recent changes in the last 24 hours include no acute events reported by staff.  Plan  Current plan is for psychiatric inpatient placement. Derrick Joseph is under involuntary commitment.      Wynetta Fines, MD 08/22/20 [phone]

## 2020-08-23 LAB — RESP PANEL BY RT-PCR (FLU A&B, COVID) ARPGX2
Influenza A by PCR: NEGATIVE
Influenza B by PCR: NEGATIVE
SARS Coronavirus 2 by RT PCR: NEGATIVE

## 2020-08-23 MED ORDER — OLANZAPINE 5 MG PO TBDP
20.0000 mg | ORAL_TABLET | Freq: Once | ORAL | Status: AC
  Administered 2020-08-23: 20 mg via ORAL
  Filled 2020-08-23: qty 4

## 2020-08-23 MED ORDER — ZIPRASIDONE MESYLATE 20 MG IM SOLR: 20.0000 mg | Freq: Once | INTRAMUSCULAR | Status: DC

## 2020-08-23 MED ORDER — LORAZEPAM 1 MG PO TABS
2.0000 mg | ORAL_TABLET | Freq: Once | ORAL | Status: AC
  Administered 2020-08-23: 2 mg via ORAL
  Filled 2020-08-23: qty 2

## 2020-08-23 NOTE — ED Notes (Signed)
Using phone to call mom. Pt telling mom he is d/c. Spoke with mom to inform her that he is not d/c yet.

## 2020-08-23 NOTE — Progress Notes (Addendum)
Per [facility], patient is in need of a thought disorder bed, for which there is currently no availability at this time.   Patient meets inpatient criteria per Liborio Nixon, NP. Patient referred to the following facilities:  Destination Service Provider Request Status Address Phone Fax  [facility]  Pending - Request Sent [address] [place] [address] [phone]  [facility]  Pending - Request Sent [address] [place] [address] [phone]  [facility]  Pending - Request Sent [address], [place] [address] [phone]  [facility]  Pending - Request Sent [address] [place] [address] [phone]  [facility]  Pending - Request Sent [address] [place] [address] [phone]  [facility]  Pending - Request Sent [address], [place] [address] [phone]  [facility]  Pending - Request Sent [address] [place] [address] [phone]  [facility]  Pending - Request Sent [address], [place] [address] [phone]  [facility]  Pending - Request Sent [address] [place] [address] [phone]  [facility]  Pending - Request Sent [address] [place] [address] [phone]  [facility]  Pending - Request Sent [address] [place] [address] [phone]    CSW will continue to monitor disposition.   Signed:  Corky Crafts, MSW, [place], LCASA 08/23/2020 3:45 PM

## 2020-08-23 NOTE — ED Notes (Addendum)
Pt at the bedside yelling to call sheriff department and police. That he is the CEO of the hospital.

## 2020-08-23 NOTE — ED Notes (Signed)
Breakfast at the bedside.

## 2020-08-23 NOTE — ED Notes (Signed)
Meal at the bedside °

## 2020-08-23 NOTE — ED Notes (Signed)
Pt resting currently. Deferring vitals for now.

## 2020-08-23 NOTE — ED Notes (Signed)
Pt yelling at the bed cursing and stating he is the CEO.

## 2020-08-24 MED ORDER — LORAZEPAM 1 MG PO TABS
2.0000 mg | ORAL_TABLET | Freq: Once | ORAL | Status: AC
  Administered 2020-08-24: 2 mg via ORAL
  Filled 2020-08-24: qty 2

## 2020-08-24 NOTE — ED Notes (Signed)
Meal tray arrived

## 2020-08-24 NOTE — ED Notes (Signed)
Patient refusing ecg at this time

## 2020-08-24 NOTE — ED Notes (Signed)
Patient making phone call at this time. 

## 2020-08-24 NOTE — ED Notes (Signed)
Patient raising voice and when told to lower voice, patient states, "I am talking to myself. They pissed me off."

## 2020-08-24 NOTE — ED Notes (Signed)
Pt continues to speak loud and yelling at the sheriff officer's and the police officer and RN that entered the unit.

## 2020-08-24 NOTE — ED Notes (Signed)
PT in doorway rambling to staff but is clam waiting for breakfast.

## 2020-08-24 NOTE — ED Notes (Signed)
Breakfast Ordered 

## 2020-08-24 NOTE — ED Notes (Signed)
Patient continues to talk aggressively stating he will "sue everyone here" and he "needs to go home." Patient continues to call himself a "king" and is using profanity to complain about his situation. Patient unable to be redirected long-term.

## 2020-08-24 NOTE — ED Notes (Signed)
In room rambling out load still door open Pt is in bed coloring.
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
## 2020-08-24 NOTE — ED Notes (Signed)
Pt showering at this time

## 2020-08-24 NOTE — ED Provider Notes (Signed)
Emergency Medicine Observation Re-evaluation Note  Derrick Joseph is a 20 y.o. male, seen on rounds today.  Pt initially presented to the ED for complaints of hx bipolar disorder, recent [facility] admission, and with persistent paranoid thoughts and delusions. Patient with continued delusional thoughts. No new physical c/o.   Physical Exam  BP 121/86 (BP Location: Left Arm)   Pulse 84   Temp 98.5 F (36.9 C) (Oral)   Resp 16   SpO2 100%  Physical Exam General: alert, content. Cardiac: regular rate. Lungs: breathing comfortably. Psych: calm, alert. +delusional thoughts.   ED Course / MDM    I have reviewed the labs performed to date as well as medications administered while in observation.  Recent changes in the last 24 hours include med management, stabilization on meds, BH reassessment.   Plan   Derrick Joseph is under involuntary commitment.   BH team is working on inpatient psych placement.    Derrick Laine, MD 08/24/20 [phone]

## 2020-08-24 NOTE — ED Notes (Signed)
Patient refusing to stay in room because he "doesn't have COVID anymore and is allowed to leave the room." Patient informed of department rules. Patient cooperates at times but requires repetitive redirection. Patient continues to talk to self, using profanities.

## 2020-08-24 NOTE — ED Notes (Signed)
Pt is calm at this time in his room coloring.

## 2020-08-24 NOTE — ED Notes (Signed)
Patient states he is the "king of [place]" and he is on "demon time." Patient raising voice and complaining of the food portion size on meals tray. Patient states he eats 6 or more meals a day. Informed patient about snack privileges available to him. Patient not satisfied with this RNs explanation. Patient doing push ups in room while continuing to use profanities and threats to "sue with his experienced team of lawyers".

## 2020-08-24 NOTE — ED Notes (Signed)
Pt at door stated that he needed double portions to eat and he was still hungry . He than stated that he wanted to go home.

## 2020-08-24 NOTE — BH Assessment (Signed)
This writer met with patient to re-assess this date. 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 Patient is observed to be having a towel wrapped around his head with pieces of paper that are stuck in it that he has drawn different figures on. At the time of assessment patient contnues to be coloring on paper. Patient denies any S/I, H/I or AVH. Case was staffed with Lewis NP who recommends a continued inpatient admission to assist with stabilization.   

## 2020-08-24 NOTE — ED Notes (Signed)
Patient asking this RN multiple times to let him go home. Patient informed of IVC process. Patient asking this RN to contact an officer at [employer] or [employer] to get him out. Patient informed of IVC reversal process. Patient asking for a therapist to talk to him. [employer] crew. Patient informed that we are awaiting a response at this time. Patient continues to talk repetitively and is making statements about who he is and his financial status (owning cars, mansions, etc.). Patient states, "I don't have COVID anymore so why are you holding me here." Patient updated about plan of care.

## 2020-08-24 NOTE — ED Notes (Signed)
Pt continues to load talking and stating that he owns this building and to send him home and to [facility]. With sporadic gibberish.

## 2020-08-24 NOTE — ED Notes (Signed)
Social work saw patient in person. Plan to continue to seek inpatient tx.

## 2020-08-25 MED ORDER — LORAZEPAM 1 MG PO TABS
2.0000 mg | ORAL_TABLET | Freq: Once | ORAL | Status: AC
  Administered 2020-08-25: 2 mg via ORAL
  Filled 2020-08-25: qty 2

## 2020-08-25 NOTE — ED Notes (Signed)
Pt pacing in room, throwing papers around, pt cursing stating that someone stole his coloring sheets. Pt states, "fuck it, people are stealing my shit."  Pt continuously talking, not re-directed.

## 2020-08-25 NOTE — ED Notes (Signed)
Pt has continuously been talking about being taken hostage, needs more papers printed out and talking loudly. Will continue to monitor pt.
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
## 2020-08-25 NOTE — ED Notes (Addendum)
Patient's talking louder, states "call the sheriff. I'll go with them instead. I need to get out of here, I'd [facility] go to [facility]. I'm the king, I'm a trillioner, I got kidnapped". Dr Estell Harpin notified re: need for Ativan to relax patient. Attempted to redirect pt, refused to lower voice.

## 2020-08-25 NOTE — ED Notes (Signed)
Breakfast Ordered 

## 2020-08-25 NOTE — ED Notes (Signed)
Pt asked to talk to his mom. This Clinical research associate reminded pt of the rules on talking on the phone: Two 5-minute phonecalls per everyday. Pt attempted to call his mom but she did not answer. Pt back in his room, talking to himself and coloring his pages. Will continue to monitor pt.

## 2020-08-25 NOTE — ED Notes (Signed)
This Clinical research associate has arrived at assignment. Pt had door opened, rambling to everyone about papers and money,. This Clinical research associate made small talk to him and pt asked to print out some papers, this Clinical research associate gave him 5 printed drawings. Pt then asked for one more because it "is absolutely necessary". This Clinical research associate explained to pt that he already had several papers from previous sitters. Pt became agitated but after being spoken to, he had calm down. Pt continues to talk to other people and goes off in an tangent. Will continue to monitor pt.

## 2020-08-25 NOTE — Progress Notes (Addendum)
Per Tilford Pillar, patient continues to meet criteria for inpatient treatment. There are no available or appropriate beds at [facility] today. CSW re-faxed referrals to the following facilities for review:  [facility]  Pending - Request Sent N/A [address] [place] [address] [phone] --  [facility]  Pending - Request Sent N/A [address], [place] [address] [phone] --  [facility]  Pending - Request Sent N/A [address] [place] [address] [phone] --  [facility]  Pending - Request Sent N/A [address] [place] [address] [phone] --  [facility]  Pending - Request Sent N/A [address] [place] [address] [phone] --  [facility]  Pending - Request Sent N/A [address], [place] [address] [phone] --  [facility]  Pending - Request Sent N/A [address] [place] [address] [phone] --  [facility]  Pending - Request Sent N/A [address] [place] [address] [phone] --  [facility]  Pending - Request Sent N/A [address] [place] [address] [phone] --  [facility]  Pending - Request Sent N/A [address] [place] [address] [phone] --  [facility]  Pending - Request Sent N/A [address] [place] [address] [phone] --  [facility]  Pending - No Request Sent N/A [address] [place] [address] [phone] --   TTS will continue to seek bed placement.  Crissie Reese, MSW, LCSW-A, LCAS-A Phone: [phone] Disposition/TOC

## 2020-08-25 NOTE — ED Notes (Signed)
Pt's breakfast has arrived, this writer took out the coffee from his plate. Pt standing up and eating his breakfast. Will continue to monitor pt.

## 2020-08-26 MED ORDER — QUETIAPINE FUMARATE 50 MG PO TABS
50.0000 mg | ORAL_TABLET | Freq: Two times a day (BID) | ORAL | Status: DC
  Administered 2020-08-26: 50 mg via ORAL
  Filled 2020-08-26: qty 1

## 2020-08-26 NOTE — ED Notes (Signed)
Patient was given a [place] Sandwich bag, w/ drink.

## 2020-08-26 NOTE — Progress Notes (Addendum)
ADDENDUM  CSW sent additional requested documents to [facility] for consideration. Situation ongoing, CSW will follow up and update note as more information becomes available.   Signed:  Corky Crafts, MSW, [place], LCASA 08/26/2020 10:40 AM  Referred out by direction of Nelly Rout, MD, Medical Director.   Patient meets inpatient criteria per Liborio Nixon, NP. Patient referred to the following facilities:  Destination Service Provider Request Status Address Phone Fax  [facility]  Pending - Request Sent [address] [place] [address] [phone]  [facility]  Pending - Request Sent [address], [place] [address] [phone]  [facility]  Pending - Request Sent [address], [place] [address] [phone]  [facility]  Pending - Request Sent [address] [place] [address] [phone]  [facility]  Pending - Request Sent [address] [place] [address] [phone]  [facility]  Pending - Request Sent [address] [place] [address] [phone]  [facility]  Pending - Request Sent [address] [place] [address] [phone]  [facility]  Pending - Request Sent [address] [place] [address] [phone]  [facility]  Pending - Request Sent [address] Karolee Ohs [place] [address] [phone]  [facility]  Pending - Request Sent [address] [place] [address] [phone]  [facility]  Pending - Request Sent [address], [place] [address] [phone]  [facility]  Pending - No Request Sent [address] Ether [place]ins [place] [address] [phone]      CSW will continue to monitor disposition.    Signed:  Corky Crafts, MSW, [place], LCASA 08/26/2020 10:09 AM

## 2020-08-26 NOTE — ED Notes (Signed)
Pt colored a little longer talking to himself and is now in the bed appearing to be asleep.

## 2020-08-26 NOTE — ED Notes (Signed)
Pushups, persistent lengthy flight of ideas continue. Talking in doorway to any audience. Remains cooperative and pleasant. Sitter present. [employer] present.

## 2020-08-26 NOTE — ED Notes (Addendum)
PT went to the bathroom. I went to the break room to warm food  He then came out and wanted to take a stack of coloring paper into the bathroom.  The sitters did not allow him to do that.  This upset him and he went back into the bathroom.  At this point I returned to the unit and the sitters informed me of what happened.    When the pt came out of the bathroom I asked him to do me the favor of picking up the papers that he laid all over the floor. I expressed safety concern.  He said he would move them around but not pick them up. He stated he was the king of his castle.  I told him that his Jalene Mullet was in [place] and I had to look out for everyone and the facility and I needed him to pick them up neatly.    He began fussing and began picking them up.  He utilized a Engineer, agricultural to store some of them. The rest he stacked neatly.

## 2020-08-26 NOTE — ED Notes (Signed)
After sitter stepped away and is no longer interacting with patient, attention seeking behavior quickly stopped and he is now sitting on side of bed coloring.

## 2020-08-26 NOTE — ED Notes (Signed)
Pt requested coloring materials. Coloring materials provided.  Pt coloring in a pleasant yet talkative mood at this time.  Pt expressing excitement to himself about his designs and how successful he will be.

## 2020-08-26 NOTE — ED Notes (Signed)
RN spoke w/ placement at [facility]. [facility] Norm Parcel. IVC paperwork to be faxed to [phone]. Report to be called when transport arrives to [phone]

## 2020-08-26 NOTE — ED Notes (Signed)
Pt asked for something to drink. When told he needed to wait until morning, he began talking non-stop stating, "ya'll shoulda let me sleep, now ya'll gonna have to listen to my mouth all night long."  After a few minutes he settled back down and started coloring again.

## 2020-08-26 NOTE — ED Notes (Signed)
Rapping/ singing in shower

## 2020-08-26 NOTE — ED Notes (Signed)
Pt at doorway doing pushups and talking about giving the staff one million dollars. Took PO medication without resistance and is agreeable to having repeat EKG done that was ordered on 8/19 but not done.

## 2020-08-26 NOTE — ED Notes (Signed)
After straitening up his room a bit he began laying his pictures out all over the floor as if they are on display.  He is currently sitting watching TV.

## 2020-08-26 NOTE — ED Notes (Signed)
Pt was sleeping but was awakened for the purposes of vitals and med pass.

## 2020-08-26 NOTE — ED Notes (Addendum)
Report received. Pt standing in door way, manic, with continuous, verbose monologue, random flight of ideas, speaking with sitter. [employer] present.  Pt currently polite, pleasant, cooperative. Delusions of grandeur. Believes he is leaving soon. Per SW note/TTS yesterday: Continuing to seek placement for inpt psych.

## 2020-08-26 NOTE — ED Notes (Addendum)
Pt at door of room talking about various topics including his mansion, being the king of [place], making girls go crazy. Not physically violent but verbally aggressive with male sitter when she tried to redirect back into room. Male sitter at bedside and patient is currently showing him pictures he has drawn through duration of stay.

## 2020-08-26 NOTE — ED Notes (Signed)
Mania continues, remains pleasant, pending TTS.

## 2020-08-26 NOTE — ED Notes (Signed)
[facility] at bedside to transport pt to [facility]. Report given to [facility] and to [place] at facility. Belongings given to [facility].

## 2021-07-04 ENCOUNTER — Other Ambulatory Visit: Payer: Self-pay

## 2021-07-04 ENCOUNTER — Encounter: Payer: Self-pay | Admitting: Emergency Medicine

## 2021-07-04 ENCOUNTER — Emergency Department (INDEPENDENT_AMBULATORY_CARE_PROVIDER_SITE_OTHER)
Admission: EM | Admit: 2021-07-04 | Discharge: 2021-07-04 | Disposition: A | Discharge status: Home / Self Care | Payer: Self-pay | Source: Home / Self Care

## 2021-07-04 DIAGNOSIS — T148XXA Other injury of unspecified body region, initial encounter: Secondary | ICD-10-CM

## 2021-07-04 NOTE — ED Triage Notes (Signed)
Penis is inflamed from using a condom x 3 days ago.Denis unprotected sex.

## 2021-07-04 NOTE — Discharge Instructions (Addendum)
Advised patient to allow affected area to heal on its own.  Advised no topical agents should be used in this area should resolve on its own in the next 7 to 14 days.  Advised patient not to wear condoms until completely resolved back to normal.  Advised if symptoms worsen and/or unresolved please follow-up with PCP or here for further evaluation.

## 2021-07-04 NOTE — ED Provider Notes (Signed)
[facility]    CSN: [id number] Arrival date & time: 07/04/21  1033      History   Chief Complaint Chief Complaint  Patient presents with   Penile Discharge    HPI [name] is a 21 y.o. male.   HPI 21 year old male presents with inflamed penis after using condoms 3 days ago.  Denies unprotected sex.  PMH significant for bipolar disorder, agitation, cannabis use disorder.  Patient is a current every day cigarette smoker.  History reviewed. No pertinent past medical history.  Patient Active Problem List   Diagnosis Date Noted   Agitation    Bipolar disorder, current episode manic without psychotic features (HCC) 07/29/2020   Cannabis use disorder, moderate, in controlled environment (HCC) 07/29/2020    History reviewed. No pertinent surgical history.     Home Medications    Prior to Admission medications   Medication Sig Start Date End Date Taking? Authorizing Provider  buPROPion (WELLBUTRIN XL) 150 MG 24 hr tablet Take 150 mg by mouth daily.   Yes [provider]  OLANZapine (ZYPREXA) 20 MG tablet Take 20 mg by mouth at bedtime.    [provider]  ziprasidone (GEODON) 60 MG capsule Take 60 mg by mouth daily. Patient not taking: Reported on 08/20/2020    [provider]    Family History History reviewed. No pertinent family history.  Social History Social History   Tobacco Use   Smoking status: Every Day    Types: Cigarettes   Smokeless tobacco: Never  Vaping Use   Vaping Use: Every day   Substances: Nicotine, Flavoring  Substance Use Topics   Alcohol use: Not Currently   Drug use: Not Currently     Allergies   Patient has no known allergies.   Review of Systems Review of Systems  Skin:  Positive for rash.  All other systems reviewed and are negative.    Physical Exam Triage Vital Signs ED Triage Vitals  Enc Vitals Group     BP 07/04/21 1046 126/79     Pulse Rate 07/04/21 1046 66     Resp  07/04/21 1046 18     Temp 07/04/21 1046 99.3 F (37.4 C)     Temp Source 07/04/21 1046 Oral     SpO2 07/04/21 1046 100 %     Weight 07/04/21 1047 140 lb (63.5 kg)     Height 07/04/21 1047 5\' 6"  (1.676 m)     Head Circumference --      Peak Flow --      Pain Score 07/04/21 1047 7     Pain Loc --      Pain Edu? --      Excl. in GC? --    No data found.  Updated Vital Signs BP 126/79 (BP Location: Left Arm)   Pulse 66   Temp 99.3 F (37.4 C) (Oral)   Resp 18   Ht 5\' 6"  (1.676 m)   Wt 140 lb (63.5 kg)   SpO2 100%   BMI 22.60 kg/m    Physical Exam Vitals and nursing note reviewed.  Constitutional:      Appearance: Normal appearance. He is normal weight.  HENT:     Head: Normocephalic and atraumatic.     Mouth/Throat:     Mouth: Mucous membranes are moist.     Pharynx: Oropharynx is clear.  Eyes:     Extraocular Movements: Extraocular movements intact.     Conjunctiva/sclera: Conjunctivae normal.  Pupils: Pupils are equal, round, and reactive to light.  Cardiovascular:     Rate and Rhythm: Normal rate and regular rhythm.     Pulses: Normal pulses.     Heart sounds: Normal heart sounds. No murmur heard. Pulmonary:     Effort: Pulmonary effort is normal.     Breath sounds: Normal breath sounds. No wheezing, rhonchi or rales.  Musculoskeletal:     Cervical back: Normal range of motion and neck supple.  Skin:    General: Skin is warm and dry.     Comments: Glans of penis (anterior aspect): ~1.5 cm fillet shaped avulsion of skin noted, 35% eschar formation noted healing well, nonerythematous, nonmacerated, nonindurated  Neurological:     General: No focal deficit present.     Mental Status: He is alert and oriented to person, place, and time.      UC Treatments / Results  Labs (all labs ordered are listed, but only abnormal results are displayed) Labs Reviewed - No data to display  EKG   Radiology No results found.  Procedures Procedures (including  critical care time)  Medications Ordered in UC Medications - No data to display  Initial Impression / Assessment and Plan / UC Course  I have reviewed the triage vital signs and the nursing notes.  Pertinent labs & imaging results that were available during my care of the patient were reviewed by me and considered in my medical decision making (see chart for details).     MDM: 1.  Abrasion of skin-Advised patient to allow affected area to heal on its own.  Advised no topical agents should be used in this area should resolve on its own in the next 7 to 14 days.  Advised patient not to wear condoms until completely resolved back to normal.  Advised if symptoms worsen and/or unresolved please follow-up with PCP or here for further evaluation.  Patient discharged home, hemodynamically stable. Final Clinical Impressions(s) / UC Diagnoses   Final diagnoses:  Abrasion of skin     Discharge Instructions      Advised patient to allow affected area to heal on its own.  Advised no topical agents should be used in this area should resolve on its own in the next 7 to 14 days.  Advised patient not to wear condoms until completely resolved back to normal.  Advised if symptoms worsen and/or unresolved please follow-up with PCP or here for further evaluation.     ED Prescriptions   None    PDMP not reviewed this encounter.   Trevor Iha, FNP 07/04/21 1123

## 2021-07-19 ENCOUNTER — Ambulatory Visit: Admission: EM | Admit: 2021-07-19 | Discharge: 2021-07-19 | Discharge status: Against Medical Advice | Payer: Self-pay

## 2022-02-25 ENCOUNTER — Emergency Department (HOSPITAL_COMMUNITY)
Admission: EM | Admit: 2022-02-25 | Discharge: 2022-02-26 | Disposition: A | Discharge status: Other Acute Inpatient Hospital | Payer: Self-pay | Attending: Emergency Medicine | Admitting: Emergency Medicine

## 2022-02-25 DIAGNOSIS — F3110 Bipolar disorder, current episode manic without psychotic features, unspecified: Secondary | ICD-10-CM | POA: Insufficient documentation

## 2022-02-25 DIAGNOSIS — F309 Manic episode, unspecified: Secondary | ICD-10-CM

## 2022-02-25 DIAGNOSIS — F17210 Nicotine dependence, cigarettes, uncomplicated: Secondary | ICD-10-CM | POA: Insufficient documentation

## 2022-02-25 DIAGNOSIS — F319 Bipolar disorder, unspecified: Secondary | ICD-10-CM | POA: Insufficient documentation

## 2022-02-25 DIAGNOSIS — Z20822 Contact with and (suspected) exposure to covid-19: Secondary | ICD-10-CM | POA: Insufficient documentation

## 2022-02-25 DIAGNOSIS — R7401 Elevation of levels of liver transaminase levels: Secondary | ICD-10-CM | POA: Insufficient documentation

## 2022-02-25 DIAGNOSIS — F1290 Cannabis use, unspecified, uncomplicated: Secondary | ICD-10-CM | POA: Insufficient documentation

## 2022-02-25 LAB — CBC WITH DIFFERENTIAL/PLATELET
Abs Immature Granulocytes: 0.03 10*3/uL (ref 0.00–0.07)
Basophils Absolute: 0 10*3/uL (ref 0.0–0.1)
Basophils Relative: 0 %
Eosinophils Absolute: 0.1 10*3/uL (ref 0.0–0.5)
Eosinophils Relative: 1 %
HCT: 46.7 % (ref 39.0–52.0)
Hemoglobin: 14.9 g/dL (ref 13.0–17.0)
Immature Granulocytes: 0 %
Lymphocytes Relative: 22 %
Lymphs Abs: 1.8 10*3/uL (ref 0.7–4.0)
MCH: 27.8 pg (ref 26.0–34.0)
MCHC: 31.9 g/dL (ref 30.0–36.0)
MCV: 87.1 fL (ref 80.0–100.0)
Monocytes Absolute: 0.7 10*3/uL (ref 0.1–1.0)
Monocytes Relative: 9 %
Neutro Abs: 5.8 10*3/uL (ref 1.7–7.7)
Neutrophils Relative %: 68 %
Platelets: 330 10*3/uL (ref 150–400)
RBC: 5.36 MIL/uL (ref 4.22–5.81)
RDW: 13.8 % (ref 11.5–15.5)
WBC: 8.5 10*3/uL (ref 4.0–10.5)
nRBC: 0 % (ref 0.0–0.2)

## 2022-02-25 LAB — COMPREHENSIVE METABOLIC PANEL
ALT: 26 U/L (ref 0–44)
AST: 49 U/L — ABNORMAL HIGH (ref 15–41)
Albumin: 4.8 g/dL (ref 3.5–5.0)
Alkaline Phosphatase: 70 U/L (ref 38–126)
Anion gap: 9 (ref 5–15)
BUN: 17 mg/dL (ref 6–20)
CO2: 27 mmol/L (ref 22–32)
Calcium: 9.4 mg/dL (ref 8.9–10.3)
Chloride: 102 mmol/L (ref 98–111)
Creatinine, Ser: 1.17 mg/dL (ref 0.61–1.24)
GFR, Estimated: >60 mL/min (ref >60)
Glucose, Bld: 89 mg/dL (ref 70–99)
Potassium: 3.3 mmol/L — ABNORMAL LOW (ref 3.5–5.1)
Sodium: 138 mmol/L (ref 135–145)
Total Bilirubin: 1 mg/dL (ref 0.3–1.2)
Total Protein: 8.2 g/dL — ABNORMAL HIGH (ref 6.5–8.1)

## 2022-02-25 LAB — RESP PANEL BY RT-PCR (RSV, FLU A&B, COVID)  RVPGX2
Influenza A by PCR: NEGATIVE
Influenza B by PCR: NEGATIVE
Resp Syncytial Virus by PCR: NEGATIVE
SARS Coronavirus 2 by RT PCR: NEGATIVE

## 2022-02-25 LAB — RAPID URINE DRUG SCREEN, HOSP PERFORMED
Amphetamines: NOT DETECTED
Barbiturates: NOT DETECTED
Benzodiazepines: NOT DETECTED
Cocaine: NOT DETECTED
Opiates: NOT DETECTED
Tetrahydrocannabinol: POSITIVE — AB

## 2022-02-25 LAB — ETHANOL: Alcohol, Ethyl (B): <10 mg/dL (ref <10)

## 2022-02-25 MED ORDER — ZIPRASIDONE MESYLATE 20 MG IM SOLR: 20.0000 mg | INTRAMUSCULAR | Status: DC | PRN

## 2022-02-25 MED ORDER — OLANZAPINE 5 MG PO TBDP: 10.0000 mg | ORAL_TABLET | Freq: Three times a day (TID) | ORAL | Status: DC | PRN

## 2022-02-25 MED ORDER — ZIPRASIDONE MESYLATE 20 MG IM SOLR
20.0000 mg | Freq: Once | INTRAMUSCULAR | Status: AC
  Administered 2022-02-25: 20 mg via INTRAMUSCULAR
  Filled 2022-02-25: qty 20

## 2022-02-25 MED ORDER — LORAZEPAM 1 MG PO TABS
1.0000 mg | ORAL_TABLET | ORAL | Status: AC | PRN
  Administered 2022-02-26: 1 mg via ORAL
  Filled 2022-02-25: qty 1

## 2022-02-25 MED ORDER — OLANZAPINE 5 MG PO TBDP: 10.0000 mg | ORAL_TABLET | Freq: Every day | ORAL | Status: DC

## 2022-02-25 MED ORDER — STERILE WATER FOR INJECTION IJ SOLN
INTRAMUSCULAR | Status: AC
  Filled 2022-02-25: qty 10

## 2022-02-25 MED ORDER — OLANZAPINE 5 MG PO TBDP
5.0000 mg | ORAL_TABLET | Freq: Every day | ORAL | Status: DC
  Administered 2022-02-25: 5 mg via ORAL
  Filled 2022-02-25: qty 1

## 2022-02-25 MED ORDER — POTASSIUM CHLORIDE CRYS ER 20 MEQ PO TBCR
20.0000 meq | EXTENDED_RELEASE_TABLET | Freq: Once | ORAL | Status: AC
  Administered 2022-02-25: 20 meq via ORAL
  Filled 2022-02-25: qty 1

## 2022-02-25 MED ORDER — OLANZAPINE 5 MG PO TBDP
10.0000 mg | ORAL_TABLET | Freq: Three times a day (TID) | ORAL | Status: DC | PRN
  Administered 2022-02-26 (×2): 10 mg via ORAL
  Filled 2022-02-25 (×2): qty 2

## 2022-02-25 MED ORDER — LORAZEPAM 1 MG PO TABS: 1.0000 mg | ORAL_TABLET | ORAL | Status: DC | PRN
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
## 2022-02-25 NOTE — ED Notes (Signed)
Pt changed into purple scrubs and belongings placed in lockers in purple

## 2022-02-25 NOTE — ED Triage Notes (Signed)
Pt IVC due to mother claiming he was having a manic episode. Pt talks loud and sometimes has aggressive language. Pt is cooperative and alert and oriented.

## 2022-02-25 NOTE — ED Notes (Signed)
Ivc paper work is not in purple zone attached to the clipboard paperwork taken by  Caryl Pina the nurse give to nurse audrey in purple zone

## 2022-02-25 NOTE — ED Notes (Signed)
IVC paperwork finished, in green nurses station in purple clipboard, IVCd:02/25/22, EXP: 03/04/22

## 2022-02-25 NOTE — ED Notes (Signed)
Pt is yelling and cursing that he wants to leave. 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 Pt then stated he needs to get out of here and smoke weed.
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
## 2022-02-25 NOTE — Progress Notes (Signed)
Pt was accepted to [facility] 02/26/22;Bed Assignment [facility] inpatient criteria per Merlyn Lot, NP  Attending Physician will be Dr. Mardelle Matte  Report can be called to:[phone]-Pager number, please leave a returned phone number to receive a phone call back.   Pt can arrive after 3:00pm  Orland Dec, RN, [address] [place], Merlyn Lot, NP   [place], LCSWA 02/25/2022 @ 9:37 PM

## 2022-02-26 MED ORDER — LORAZEPAM 1 MG PO TABS
1.0000 mg | ORAL_TABLET | Freq: Once | ORAL | Status: AC
  Administered 2022-02-26: 1 mg via ORAL
  Filled 2022-02-26: qty 1

## 2022-02-26 MED ORDER — OLANZAPINE 10 MG PO TABS: 10.0000 mg | ORAL_TABLET | Freq: Every day | ORAL | Status: DC

## 2022-02-26 MED ORDER — NICOTINE 21 MG/24HR TD PT24
21.0000 mg | MEDICATED_PATCH | Freq: Every day | TRANSDERMAL | Status: DC
  Administered 2022-02-26: 21 mg via TRANSDERMAL
  Filled 2022-02-26: qty 1

## 2022-02-26 NOTE — ED Notes (Signed)
Patient pacing in department. Speech rapid. Patient stating he needs to leave.

## 2022-02-26 NOTE — ED Provider Notes (Signed)
Emergency Medicine Observation Re-evaluation Note  Derrick Joseph is a 22 y.o. male, seen on rounds today.  Pt initially presented to the ED for complaints of No chief complaint on file. Currently, the patient is awake, alert, pacing in the hallway.  Per nursing staff, patient took his oral Zyprexa earlier today.  Patient has been appearing restless, but he is directable.  Physical Exam  BP (!) 122/90 (BP Location: Left Arm)   Pulse 66   Temp 98.2 F (36.8 C)   Resp 16   Ht 5' 7"$  (1.702 m)   Wt 68 kg   SpO2 100%   BMI 23.49 kg/m  Physical Exam General: Restless appearing, alert Cardiac: Regular rate Lungs: No respiratory distress Psych: Anxious/restless appearing  ED Course / MDM  EKG:EKG Interpretation  Date/Time:  Wednesday February 25 2022 12:49:32 EST Ventricular Rate:  73 PR Interval:  144 QRS Duration: 88 QT Interval:  369 QTC Calculation: 407 R Axis:   70 Text Interpretation: Sinus rhythm No significant change since last tracing Confirmed by Leanord Asal ([id number]) on 02/25/2022 12:52:50 PM  I have reviewed the labs performed to date as well as medications administered while in observation.  Recent changes in the last 24 hours include -patient has been excepted for admission to [facility].  Patient should be able to be transferred this afternoon, as long as he does not get worse.  Plan  Current plan is for continued expectant management.    Varney Biles, MD 02/26/22 [phone]

## 2022-03-09 ENCOUNTER — Other Ambulatory Visit: Payer: Self-pay

## 2022-03-09 ENCOUNTER — Inpatient Hospital Stay (HOSPITAL_COMMUNITY)
Admission: AD | Admit: 2022-03-09 | Discharge: 2022-03-17 | DRG: 885 | Disposition: A | Discharge status: Home / Self Care | Payer: 59 | Source: Intra-hospital | Attending: Psychiatry | Admitting: Psychiatry

## 2022-03-09 ENCOUNTER — Ambulatory Visit (HOSPITAL_COMMUNITY)
Admission: EM | Admit: 2022-03-09 | Discharge: 2022-03-09 | Disposition: A | Discharge status: Home / Self Care | Payer: No Payment, Other | Attending: Urology | Admitting: Urology

## 2022-03-09 ENCOUNTER — Encounter (HOSPITAL_COMMUNITY): Payer: Self-pay | Admitting: Psychiatry

## 2022-03-09 DIAGNOSIS — F17290 Nicotine dependence, other tobacco product, uncomplicated: Secondary | ICD-10-CM | POA: Diagnosis present

## 2022-03-09 DIAGNOSIS — F17210 Nicotine dependence, cigarettes, uncomplicated: Secondary | ICD-10-CM | POA: Diagnosis not present

## 2022-03-09 DIAGNOSIS — F3110 Bipolar disorder, current episode manic without psychotic features, unspecified: Secondary | ICD-10-CM

## 2022-03-09 DIAGNOSIS — Z9151 Personal history of suicidal behavior: Secondary | ICD-10-CM

## 2022-03-09 DIAGNOSIS — Z91148 Patient's other noncompliance with medication regimen for other reason: Secondary | ICD-10-CM | POA: Diagnosis not present

## 2022-03-09 DIAGNOSIS — F3113 Bipolar disorder, current episode manic without psychotic features, severe: Secondary | ICD-10-CM | POA: Diagnosis present

## 2022-03-09 DIAGNOSIS — G4700 Insomnia, unspecified: Secondary | ICD-10-CM | POA: Diagnosis present

## 2022-03-09 DIAGNOSIS — R451 Restlessness and agitation: Secondary | ICD-10-CM | POA: Insufficient documentation

## 2022-03-09 DIAGNOSIS — F319 Bipolar disorder, unspecified: Secondary | ICD-10-CM | POA: Diagnosis present

## 2022-03-09 DIAGNOSIS — Z91199 Patient's noncompliance with other medical treatment and regimen due to unspecified reason: Secondary | ICD-10-CM

## 2022-03-09 DIAGNOSIS — Z1152 Encounter for screening for COVID-19: Secondary | ICD-10-CM | POA: Insufficient documentation

## 2022-03-09 LAB — RESP PANEL BY RT-PCR (RSV, FLU A&B, COVID)  RVPGX2
Influenza A by PCR: NEGATIVE
Influenza B by PCR: NEGATIVE
Resp Syncytial Virus by PCR: NEGATIVE
SARS Coronavirus 2 by RT PCR: NEGATIVE

## 2022-03-09 LAB — POC SARS CORONAVIRUS 2 AG: SARSCOV2ONAVIRUS 2 AG: NEGATIVE

## 2022-03-09 MED ORDER — TRAZODONE HCL 50 MG PO TABS: 50.0000 mg | ORAL_TABLET | Freq: Every evening | ORAL | Status: DC | PRN

## 2022-03-09 MED ORDER — MAGNESIUM HYDROXIDE 400 MG/5ML PO SUSP: 30.0000 mL | Freq: Every day | ORAL | Status: DC | PRN

## 2022-03-09 MED ORDER — LORAZEPAM 2 MG/ML IJ SOLN: 2.0000 mg | Freq: Three times a day (TID) | INTRAMUSCULAR | Status: DC | PRN

## 2022-03-09 MED ORDER — DIPHENHYDRAMINE HCL 50 MG/ML IJ SOLN: 50.0000 mg | Freq: Three times a day (TID) | INTRAMUSCULAR | Status: DC | PRN

## 2022-03-09 MED ORDER — LORAZEPAM 1 MG PO TABS: 1.0000 mg | ORAL_TABLET | ORAL | Status: DC | PRN

## 2022-03-09 MED ORDER — ALUM & MAG HYDROXIDE-SIMETH 200-200-20 MG/5ML PO SUSP: 30.0000 mL | ORAL | Status: DC | PRN

## 2022-03-09 MED ORDER — LORAZEPAM 1 MG PO TABS
2.0000 mg | ORAL_TABLET | Freq: Three times a day (TID) | ORAL | Status: DC | PRN
  Administered 2022-03-09 – 2022-03-11 (×3): 2 mg via ORAL
  Filled 2022-03-09 (×3): qty 2

## 2022-03-09 MED ORDER — ACETAMINOPHEN 325 MG PO TABS: 650.0000 mg | ORAL_TABLET | Freq: Four times a day (QID) | ORAL | Status: DC | PRN

## 2022-03-09 MED ORDER — TRAZODONE HCL 50 MG PO TABS
50.0000 mg | ORAL_TABLET | Freq: Every evening | ORAL | Status: DC | PRN
  Administered 2022-03-10 (×2): 50 mg via ORAL
  Filled 2022-03-09 (×2): qty 1

## 2022-03-09 MED ORDER — HYDROXYZINE HCL 25 MG PO TABS
25.0000 mg | ORAL_TABLET | Freq: Three times a day (TID) | ORAL | Status: DC | PRN
  Administered 2022-03-09: 25 mg via ORAL
  Filled 2022-03-09: qty 1

## 2022-03-09 MED ORDER — HALOPERIDOL LACTATE 5 MG/ML IJ SOLN: 5.0000 mg | Freq: Three times a day (TID) | INTRAMUSCULAR | Status: DC | PRN

## 2022-03-09 MED ORDER — OLANZAPINE 5 MG PO TBDP: 5.0000 mg | ORAL_TABLET | Freq: Every day | ORAL | Status: DC

## 2022-03-09 MED ORDER — ZIPRASIDONE MESYLATE 20 MG IM SOLR
20.0000 mg | Freq: Two times a day (BID) | INTRAMUSCULAR | Status: DC | PRN
  Administered 2022-03-09: 20 mg via INTRAMUSCULAR
  Filled 2022-03-09: qty 20

## 2022-03-09 MED ORDER — HYDROXYZINE HCL 25 MG PO TABS
25.0000 mg | ORAL_TABLET | Freq: Three times a day (TID) | ORAL | Status: DC | PRN
  Administered 2022-03-10 – 2022-03-16 (×12): 25 mg via ORAL
  Filled 2022-03-09 (×12): qty 1

## 2022-03-09 MED ORDER — OLANZAPINE 5 MG PO TBDP
5.0000 mg | ORAL_TABLET | Freq: Every day | ORAL | Status: DC
  Administered 2022-03-09: 5 mg via ORAL
  Filled 2022-03-09: qty 1

## 2022-03-09 MED ORDER — OLANZAPINE 5 MG PO TBDP
5.0000 mg | ORAL_TABLET | Freq: Every day | ORAL | Status: DC
  Administered 2022-03-10: 5 mg via ORAL
  Filled 2022-03-09 (×4): qty 1

## 2022-03-09 MED ORDER — LORAZEPAM 2 MG/ML IJ SOLN: 1.0000 mg | Freq: Once | INTRAMUSCULAR | Status: AC

## 2022-03-09 MED ORDER — DIPHENHYDRAMINE HCL 25 MG PO CAPS
50.0000 mg | ORAL_CAPSULE | Freq: Three times a day (TID) | ORAL | Status: DC | PRN
  Administered 2022-03-09 – 2022-03-13 (×3): 50 mg via ORAL
  Filled 2022-03-09 (×3): qty 2

## 2022-03-09 MED ORDER — OLANZAPINE 10 MG PO TBDP: 10.0000 mg | ORAL_TABLET | Freq: Every day | ORAL | Status: DC

## 2022-03-09 MED ORDER — LORAZEPAM 2 MG/ML IJ SOLN
INTRAMUSCULAR | Status: AC
  Administered 2022-03-09: 1 mg via INTRAMUSCULAR
  Filled 2022-03-09: qty 1

## 2022-03-09 MED ORDER — HALOPERIDOL 5 MG PO TABS
5.0000 mg | ORAL_TABLET | Freq: Three times a day (TID) | ORAL | Status: DC | PRN
  Administered 2022-03-09: 5 mg via ORAL
  Filled 2022-03-09: qty 1

## 2022-03-09 MED ORDER — LORAZEPAM 2 MG/ML IJ SOLN: 1.0000 mg | Freq: Once | INTRAMUSCULAR | Status: DC

## 2022-03-09 MED ORDER — OLANZAPINE 10 MG PO TBDP
10.0000 mg | ORAL_TABLET | Freq: Every day | ORAL | Status: DC
  Administered 2022-03-09: 10 mg via ORAL
  Filled 2022-03-09 (×4): qty 1

## 2022-03-09 MED ORDER — ACETAMINOPHEN 325 MG PO TABS
650.0000 mg | ORAL_TABLET | Freq: Four times a day (QID) | ORAL | Status: DC | PRN
  Administered 2022-03-09 – 2022-03-15 (×9): 650 mg via ORAL
  Filled 2022-03-09 (×9): qty 2

## 2022-03-09 MED ORDER — LORAZEPAM 1 MG PO TABS
2.0000 mg | ORAL_TABLET | Freq: Once | ORAL | Status: AC
  Administered 2022-03-09: 2 mg via ORAL
  Filled 2022-03-09: qty 2

## 2022-03-09 NOTE — ED Notes (Signed)
While attempting to get vitals pt refused to have temp taken.

## 2022-03-09 NOTE — Group Note (Signed)
[facility] LCSW Group Therapy Note  Date/Time:  Type of Therapy and Topic:  Group Therapy:  Who Am I?  Self Esteem, Self-Actualization and Understanding Self.  Participation Level:  Did not attend  Description of Group:    In this group patients will be asked to explore values, beliefs, truths, and morals as they relate to personal self.  Patients will be guided to discuss their thoughts, feelings, and behaviors related to what they identify as important to their true self. Patients will process together how values, beliefs and truths are connected to specific choices patients make every day. Each patient will be challenged to identify changes that they are motivated to make in order to improve self-esteem and self-actualization. This group will be process-oriented, with patients participating in exploration of their own experiences as well as giving and receiving support and challenge from other group members.  Therapeutic Goals: Patient will identify false beliefs that currently interfere with their self-esteem.  Patient will identify feelings, thought process, and behaviors related to self and will become aware of the uniqueness of themselves and of others.  Patient will be able to identify and verbalize values, morals, and beliefs as they relate to self. Patient will begin to learn how to build self-esteem/self-awareness by expressing what is important and unique to them personally.  Summary of Patient Progress: x     Therapeutic Modalities:   Cognitive Behavioral Therapy Solution Focused Therapy Motivational Interviewing Brief Therapy   Rheagan Nayak, LCSW, [place] Social Worker  [facility]

## 2022-03-09 NOTE — ED Notes (Signed)
Patient approached and continues to have pressured speech, hyperverbal stating '' I will own this place. I fucking can't believe what they did to me, they gave me shots, if you give me any more shots I will fucking kill you, do you hear me, I will fucking kill you. !''  Patient accepted oral am medications with redirection from writer. Pt then continues to demand discharge stating '' I got that medication at the house, I can get the fuck out of here. Let me call my mom. ''' Given oatmeal and fruit and juice. Pt is observed while talking to be pounding his fists on the glass at nurses desk and the ledge next to desk as he talks. MHT continue to offer emotional support and verbal redirection.

## 2022-03-09 NOTE — Progress Notes (Signed)
Received report for this patient at 0730. Upon entry to the unit patient could be heard banging against the door to flex area. Patient was redirected verbally by the MHT.  Patient was later observed to be urinating in the floor beside recliner in flex area.  Patient has been grandiose, hyperverbal, and labile. Pt is safe, NP notified of above due to concern for patients unpredictable behaviors. Will con't to monitor.

## 2022-03-09 NOTE — ED Provider Notes (Signed)
[facility] Continuous Assessment Admission H&P  Date: 03/09/22 Patient Name: Derrick Joseph MRN: [id number] Chief Complaint: "I was asleep and police woke me up and brought me here"  Diagnoses:  Final diagnoses:  Bipolar I disorder, most recent episode (or current) manic ([place])    HPI: Derrick Joseph is a 22 year old male with psychiatric history significant for bipolar disorder, cannabis use disorder, and agitation.  Patient was brought to [facility]  by law enforcement under IVC petitioned by his mother.  Per IVC: Respondent has been diagnosed with bipolar mania. Respondent has stated that he has been awake for 3 days straight. Respondent was recently released from [facility]. Respondent tried to overdose on his meds saying the took 10 pills. When he should have only take 1. Respondent states that he is having racing thoughts. Respondent also stated that he feels unsafe. Respondent is having delusional and grandiose thoughts stating he is over the police and they can't do anything to him. Without intervention the respondent could harm himself.   Patient was evaluated face-to-face and his chart was reviewed upon his arrival to [facility] by this nurse practitioner. On evaluation, patient is alert and oriented X4.  patient is noted to be manic with pressured and rapid speech. He is actively delusional, and has disorganized thought process.  Patient has flight of idea and it's difficult to engage in conversation with patient. When asked reason for coming to [facility] patient goes on a tangent about suing [employer]. He is hyper-religious, stopping to periodically to pray, asking God to help him remain calm. He has grandiose delusions about being the owner of [employer], being a king, the president of [place], and [place] states. Patient is agitated and threatening to harm staffs and security officers; he is noted beating on his chest and posturing with clench fist at security officers. He is  unable to be  redirected.  He denies suicidal ideation; he denies homicidal ideation however is he verbally aggressive and threatening to hit staffs. He denies hallucination, paranoia, and substance use.   Muiltple attempts made by this NP and TTS counselor to contact IVC petitioner (patient's mother) but she did not answer.    Total Time spent with patient: 30 minutes  Musculoskeletal  Strength & Muscle Tone: within normal limits Gait & Station: normal Patient leans: Right  Psychiatric Specialty Exam  Presentation General Appearance:  Appropriate for Environment  Eye Contact: Good  Speech: Pressured  Speech Volume: Increased  Handedness: Right   Mood and Affect  Mood: Irritable  Affect: Congruent   Thought Process  Thought Processes: Disorganized  Descriptions of Associations:Tangential  Orientation:Full (Time, Place and Person)  Thought Content:Tangential  Diagnosis of Schizophrenia or Schizoaffective disorder in past: No   Hallucinations:Hallucinations: None  Ideas of Reference:None  Suicidal Thoughts:Suicidal Thoughts: No  Homicidal Thoughts:Homicidal Thoughts: Yes, Active HI Active Intent and/or Plan: Without Plan   Sensorium  Memory: Immediate Poor; Remote Poor; Recent Poor  Judgment: Impaired  Insight: Poor   Executive Functions  Concentration: Poor  Attention Span: Poor  Recall: Poor  Fund of Knowledge: Poor  Language: Poor   Psychomotor Activity  Psychomotor Activity: Psychomotor Activity: Increased   Assets  Assets: Housing; Social Support   Sleep  Sleep: Sleep: Poor Number of Hours of Sleep: 0 (per IVC petition, patient has not slept in 3 days)   Nutritional Assessment (For OBS and FBC admissions only) Has the patient had a weight loss or gain of 10 pounds or more in the last 3  months?: -- (pt is unable to answer) Has the patient had a decrease in food intake/or appetite?: -- (pt is unable to answer) Does the  patient have dental problems?: -- (pt is unable to answer) Does the patient have eating habits or behaviors that may be indicators of an eating disorder including binging or inducing vomiting?: -- (pt is unable to answer) Has the patient recently lost weight without trying?: -- (pt is unable to answer) Has the patient been eating poorly because of a decreased appetite?: -- (pt is unable to answer)    Physical Exam Vitals and nursing note reviewed.  Constitutional:      General: He is not in acute distress.    Appearance: He is well-developed.  HENT:     Head: Normocephalic and atraumatic.  Eyes:     Conjunctiva/sclera: Conjunctivae normal.  Cardiovascular:     Rate and Rhythm: Tachycardia present.  Pulmonary:     Effort: Pulmonary effort is normal. No respiratory distress.  Abdominal:     Palpations: Abdomen is soft.     Tenderness: There is no abdominal tenderness.  Musculoskeletal:        General: No swelling.     Cervical back: Normal range of motion.  Skin:    General: Skin is warm and dry.     Capillary Refill: Capillary refill takes less than 2 seconds.  Neurological:     Mental Status: He is alert.  Psychiatric:        Attention and Perception: Attention and perception normal.        Mood and Affect: Affect is angry.        Speech: Speech normal.        Behavior: Behavior is agitated and hyperactive.        Thought Content: Thought content normal.        Judgment: Judgment is inappropriate.    Review of Systems  Constitutional: Negative.   HENT: Negative.    Eyes: Negative.   Respiratory: Negative.    Cardiovascular: Negative.   Gastrointestinal: Negative.   Genitourinary: Negative.   Musculoskeletal: Negative.   Skin: Negative.   Neurological: Negative.   Endo/Heme/Allergies: Negative.   Psychiatric/Behavioral:  The patient is nervous/anxious.     Blood pressure (!) 142/97, pulse (!) 117, resp. rate 20, SpO2 100 %. There is no height or weight on file to  calculate BMI.  Past Psychiatric History: bipolar disorder, cannabis use disorder, and agitation.     Is the patient at risk to self? No  Has the patient been a risk to self in the past 6 months? No .    Has the patient been a risk to self within the distant past? Yes   Is the patient a risk to others? Yes   Has the patient been a risk to others in the past 6 months? No   Has the patient been a risk to others within the distant past? Yes   Past Medical History: No past medical history on file.   Family History: No family history on file.   Social History:  Social History   Tobacco Use   Smoking status: Every Day    Types: Cigarettes   Smokeless tobacco: Never  Vaping Use   Vaping Use: Every day   Substances: Nicotine, Flavoring  Substance Use Topics   Alcohol use: Not Currently   Drug use: Not Currently     Last Labs:  Admission on 02/25/2022, Discharged on 02/26/2022  Component Date Value Ref  Range Status   SARS Coronavirus 2 by RT PCR 02/25/2022 NEGATIVE  NEGATIVE Final   Influenza A by PCR 02/25/2022 NEGATIVE  NEGATIVE Final   Influenza B by PCR 02/25/2022 NEGATIVE  NEGATIVE Final   Comment: (NOTE) The Xpert Xpress SARS-CoV-2/FLU/RSV plus assay is intended as an aid in the diagnosis of influenza from Nasopharyngeal swab specimens and should not be used as a sole basis for treatment. Nasal washings and aspirates are unacceptable for Xpert Xpress SARS-CoV-2/FLU/RSV testing.  Fact Sheet for Patients: EntrepreneurPulse.com.au  Fact Sheet for Healthcare Providers: IncredibleEmployment.be  This test is not yet approved or cleared by the [place] FDA and has been authorized for detection and/or diagnosis of SARS-CoV-2 by FDA under an Emergency Use Authorization (EUA). This EUA will remain in effect (meaning this test can be used) for the duration of the COVID-19 declaration under Section 564(b)(1) of the Act, 21  U.S.C. section 360bbb-3(b)(1), unless the authorization is terminated or revoked.     Resp Syncytial Virus by PCR 02/25/2022 NEGATIVE  NEGATIVE Final   Comment: (NOTE) Fact Sheet for Patients: EntrepreneurPulse.com.au  Fact Sheet for Healthcare Providers: IncredibleEmployment.be  This test is not yet approved or cleared by the [place] FDA and has been authorized for detection and/or diagnosis of SARS-CoV-2 by FDA under an Emergency Use Authorization (EUA). This EUA will remain in effect (meaning this test can be used) for the duration of the COVID-19 declaration under Section 564(b)(1) of the Act, 21 U.S.C. section 360bbb-3(b)(1), unless the authorization is terminated or revoked.  Performed at [facility], [place] [address] [place], [place] [address]    Sodium 02/25/2022 138  135 - 145 mmol/L Final   Potassium 02/25/2022 3.3 (L)  3.5 - 5.1 mmol/L Final   Chloride 02/25/2022 102  98 - 111 mmol/L Final   CO2 02/25/2022 27  22 - 32 mmol/L Final   Glucose, Bld 02/25/2022 89  70 - 99 mg/dL Final   Glucose reference range applies only to samples taken after fasting for at least 8 hours.   BUN 02/25/2022 17  6 - 20 mg/dL Final   Creatinine, Ser 02/25/2022 1.17  0.61 - 1.24 mg/dL Final   Calcium 02/25/2022 9.4  8.9 - 10.3 mg/dL Final   Total Protein 02/25/2022 8.2 (H)  6.5 - 8.1 g/dL Final   Albumin 02/25/2022 4.8  3.5 - 5.0 g/dL Final   AST 02/25/2022 49 (H)  15 - 41 U/L Final   ALT 02/25/2022 26  0 - 44 U/L Final   Alkaline Phosphatase 02/25/2022 70  38 - 126 U/L Final   Total Bilirubin 02/25/2022 1.0  0.3 - 1.2 mg/dL Final   GFR, Estimated 02/25/2022 >60  >60 mL/min Final   Comment: (NOTE) Calculated using the CKD-EPI Creatinine Equation (2021)    Anion gap 02/25/2022 9  5 - 15 Final   Performed at [facility], [place] [address] [place], [place] [address]   Alcohol, Ethyl (B) 02/25/2022 <10  <10 mg/dL Final   Comment:  (NOTE) Lowest detectable limit for serum alcohol is 10 mg/dL.  For medical purposes only. Performed at [facility], [place] [address], [place], [place] [address]    Opiates 02/25/2022 NONE DETECTED  NONE DETECTED Final   Cocaine 02/25/2022 NONE DETECTED  NONE DETECTED Final   Benzodiazepines 02/25/2022 NONE DETECTED  NONE DETECTED Final   Amphetamines 02/25/2022 NONE DETECTED  NONE DETECTED Final   Tetrahydrocannabinol 02/25/2022 POSITIVE (A)  NONE DETECTED Final  Barbiturates 02/25/2022 NONE DETECTED  NONE DETECTED Final   Comment: (NOTE) DRUG SCREEN FOR MEDICAL PURPOSES ONLY.  IF CONFIRMATION IS NEEDED FOR ANY PURPOSE, NOTIFY LAB WITHIN 5 DAYS.  LOWEST DETECTABLE LIMITS FOR URINE DRUG SCREEN Drug Class                     Cutoff (ng/mL) Amphetamine and metabolites    1000 Barbiturate and metabolites    200 Benzodiazepine                 200 Opiates and metabolites        300 Cocaine and metabolites        300 THC                            50 Performed at [facility], [place] [address], [place], [place] [address]    WBC 02/25/2022 8.5  4.0 - 10.5 K/uL Final   RBC 02/25/2022 5.36  4.22 - 5.81 MIL/uL Final   Hemoglobin 02/25/2022 14.9  13.0 - 17.0 g/dL Final   HCT 02/25/2022 46.7  39.0 - 52.0 % Final   MCV 02/25/2022 87.1  80.0 - 100.0 fL Final   MCH 02/25/2022 27.8  26.0 - 34.0 pg Final   MCHC 02/25/2022 31.9  30.0 - 36.0 g/dL Final   RDW 02/25/2022 13.8  11.5 - 15.5 % Final   Platelets 02/25/2022 330  150 - 400 K/uL Final   nRBC 02/25/2022 0.0  0.0 - 0.2 % Final   Neutrophils Relative % 02/25/2022 68  % Final   Neutro Abs 02/25/2022 5.8  1.7 - 7.7 K/uL Final   Lymphocytes Relative 02/25/2022 22  % Final   Lymphs Abs 02/25/2022 1.8  0.7 - 4.0 K/uL Final   Monocytes Relative 02/25/2022 9  % Final   Monocytes Absolute 02/25/2022 0.7  0.1 - 1.0 K/uL Final   Eosinophils Relative 02/25/2022 1  % Final   Eosinophils Absolute 02/25/2022 0.1  0.0 - 0.5 K/uL Final    Basophils Relative 02/25/2022 0  % Final   Basophils Absolute 02/25/2022 0.0  0.0 - 0.1 K/uL Final   Immature Granulocytes 02/25/2022 0  % Final   Abs Immature Granulocytes 02/25/2022 0.03  0.00 - 0.07 K/uL Final   Performed at [facility], [place] [address] [place], [place] [address]    Allergies: Patient has no known allergies.  Medications:  Facility Ordered Medications  Medication   acetaminophen (TYLENOL) tablet 650 mg   alum & mag hydroxide-simeth (MAALOX/MYLANTA) 200-200-20 MG/5ML suspension 30 mL   magnesium hydroxide (MILK OF MAGNESIA) suspension 30 mL   hydrOXYzine (ATARAX) tablet 25 mg   traZODone (DESYREL) tablet 50 mg   ziprasidone (GEODON) injection 20 mg   [COMPLETED] LORazepam (ATIVAN) injection 1 mg    Medical Decision Making  Patient is actively manic with pressured and rapid speech,flight of ideas, grandiose delusions, disorganized thought process. Patient meets criteria for inpatient psychiatric treatment. Patient will be admitted to [facility] while awaiting inpatient psych bed availability.  Lab Orders         Resp panel by RT-PCR (RSV, Flu A&B, Covid) Anterior Nasal Swab         CBC with Differential/Platelet         Comprehensive metabolic panel         Hemoglobin A1c         Ethanol  TSH         POCT Urine Drug Screen - (I-Screen)     Agitation protocol ordered      Recommendations  Based on my evaluation the patient does not appear to have an emergency medical condition.  Ophelia Shoulder, NP 03/09/22  4:59 AM

## 2022-03-09 NOTE — ED Notes (Signed)
Pt standing on top of chair demanding discharge. NP carolyn notified of patient escalating behaviors. Pt offered ativan '2mg'$  po and accepted. Continues to be fixated on discharge and manic, hyperverbal and grandiose stating '' my mother is the president of this country, and her fucking birthday is in two days. I am privileged, just you talking to me now is a privilege. '' Pt demanding to call [employer] or sheriff for discharge . Pt redirected verbally.  Will con't to monitor.

## 2022-03-09 NOTE — ED Notes (Signed)
Report called to [facility] RN

## 2022-03-09 NOTE — BH Assessment (Addendum)
Comprehensive Clinical Assessment (CCA) Note  03/09/2022 Derrick Joseph [id number]  Disposition: Derrick Reasoner, NP, patient meets inpatient criteria.  The patient demonstrates the following risk factors for suicide: Chronic risk factors for suicide include: psychiatric disorder of bipolar and substance use disorder. Acute risk factors for suicide include: family or marital conflict. Protective factors for this patient include: responsibility to others (children, family) and coping skills. Considering these factors, the overall suicide risk at this point appears to be high. Patient is not appropriate for outpatient follow up.  Derrick Joseph is a 22 year old male presenting under IVC to [facility] due to manic behaviors. Per chart, patient history is significant for bipolar disorder, cannabis use disorder and agitation. Patient is currently manic, rapid speech and racing thoughts and it is difficult to maintain a conversation with him and to have him answer questions. Patient has flight of ideas. Multiple questions attempted, patient continues to ramble and states "I'm my own shit, my own boss, I own [employer]". Patient refuses to talk with NP and TTS clinician, stating "I don't want to talk to you right now". Patient continues to ramble about owning everything, houses, cars, etc. Patient states "guess how much I made off [place] last year, I made $40 billion off them last year". Patient then started praying for himself and the [facility] building. Due to manic behaviors, flight of ideas, TTS clinician was unable to assess.       IVC, petitioner, mother, Derrick Joseph [phone]  Respondent has been diagnosed with bipolar mania. Respondent has stated that he has been awake for 3 days straight. Respondent was recently released from [facility]. Respondent tried to overdose on his meds saying the took 10 pills. When he should have only take 1. Respondent states that he is having racing thoughts. Respondent also  stated that he feels unsafe. Respondent is having delusional and grandiose thoughts stating he is over the police and they can't do anything to him. Without intervention the respondent could harm himself.  TTS clinician attempted to contact IVC petitioner, mother Derrick Joseph, [phone] multiple times, no answer.   Chief Complaint:  Chief Complaint  Patient presents with   Psychiatric Evaluation   Visit Diagnosis: Active Psychosis  CCA Screening, Triage and Referral (STR)  Patient Reported Information How did you hear about [place]? Legal System  What Is the Reason for Your Visit/Call Today? Derrick Joseph is a 22 year old male presenting under IVC to [facility] due to manic behaviors. Patient is currently manic and it is difficult to maintain a conversation with him and to have him answer questions. Patient has flight of ideas. Multiple questions attempted, patient continues to ramble and states "I'm my own shit, my own boss, I own [employer]". Patient refuses to talk with NP and TTS clinician, stating "I don't want to talk to you right now". Patient continues to ramble about owning everything, houses, cars, etc. Patient states "guess how much I made off [place] last year, I made $40 billion off them last year". Patient then started praying for himself and the [facility] building. Due to manic behaviors, flight of ideas, TTS clinician was unable to assess.     IVC, petitioner, mother, Derrick Joseph [phone]  Respondent has been diagnosed with bipolar mania. Respondent has stated that he has been awake for 3 days straight. Respondent was recently released from [facility]. Respondent tried to overdose on his meds saying the took 10 pills. When he should have only take 1. Respondent states that  he is having racing thoughts. Respondent also stated that he feels unsafe. Respondent is having delusional and grandiose thoughts stating he is over the police and they can't do anything to him. Without intervention  the respondent could harm himself.  How Long Has This Been Causing You Problems? -- (uta due to manic state)  What Do You Feel Would Help You the Most Today? Treatment for Depression or other mood problem   Have You Recently Had Any Thoughts About Hurting Yourself? -- (uta due to manic state)  Are You Planning to Commit Suicide/Harm Yourself At This time? -- (uta due to manic state)   [place] ED from 02/25/2022 in [facility] Emergency Department at [facility] ED from 07/04/2021 in [facility] Care at [facility] ED from 08/20/2020 in [facility] Emergency Department at [place] High Risk No Risk Error: Question 6 not populated       Have you Recently Had Thoughts About [place]? -- (uta due to manic state)  Are You Planning to Harm Someone at This Time? -- (uta due to manic state)  Explanation: n/a   Have You Used Any Alcohol or Drugs in the Past 24 Hours? -- (uta due to manic state)  What Did You Use and How Much? n/a   Do You Currently Have a Therapist/Psychiatrist? -- (uta due to manic state)  Name of Therapist/Psychiatrist: Name of Therapist/Psychiatrist: n/a   Have You Been Recently Discharged From Any Office Practice or Programs? Yes  Explanation of Discharge From Practice/Program: [facility]     CCA Screening Triage Referral Assessment Type of Contact: Face-to-Face  Telemedicine Service Delivery:   Is this Initial or Reassessment?   Date Telepsych consult ordered in [facility]:    Time Telepsych consult ordered in [facility]:    Location of Assessment: [facility] Assessment Services  Provider Location: [facility] Assessment Services   Collateral Involvement: none reported   Does Patient Have a [place]? No  Legal Guardian Contact Information: n/a  Copy of Legal Guardianship Form: -- (n/a)  Legal Guardian Notified of Arrival: -- (n/a)  Legal Guardian Notified of Pending  Discharge: -- (n/a)  If Minor and Not Living with Parent(s), Who has Custody? n/a  Is CPS involved or ever been involved? -- (uta due to manic state)  Is APS involved or ever been involved? -- (uta due to manic state)   Patient Determined To Be At Risk for Harm To Self or Others Based on Review of Patient Reported Information or Presenting Complaint? Yes, for Self-Harm  Method: -- (uta due to manic state)  Availability of Means: -- (uta due to manic state)  Intent: -- (uta due to manic state)  Notification Required: -- (uta due to manic state)  Additional Information for Danger to Others Potential: -- (uta due to manic state)  Additional Comments for Danger to Others Potential: patient is currently actively psychotic  Are There Guns or Other Weapons in [place]? -- (uta due to manic state)  Types of Guns/Weapons: uta due to manic state  Are These Weapons Safely Secured?                            -- (uta due to manic state)  Who Could Verify You Are Able To Have These Secured: uta due to manic state  Do You Have any Outstanding Charges, Pending Court Dates, Parole/Probation? Pincus Badder  due to manic state  Contacted To Inform of Risk of Harm To Self or Others: No data recorded   Does Patient Present under Involuntary Commitment? Yes    [place] of Residence: [place]   Patient Currently Receiving the Following Services: Not Receiving Services   Determination of Need: Emergent (2 hours)   Options For Referral: Inpatient Hospitalization; Medication Management; Outpatient Therapy; [facility]; Other: Comment (Continuous Observation)     CCA Biopsychosocial Patient Reported Schizophrenia/Schizoaffective Diagnosis in Past: No   Strengths: Patient was alert.   Mental Health Symptoms Depression:   -- (uta due to manic state)   Duration of Depressive symptoms:    Mania:   Change in energy/activity; Increased Energy; Overconfidence; Racing thoughts; Irritability    Anxiety:    Irritability; Tension; Restlessness   Psychosis:   Delusions   Duration of Psychotic symptoms:  Duration of Psychotic Symptoms: Less than six months   Trauma:   None   Obsessions:   None   Compulsions:   None   Inattention:   None   Hyperactivity/Impulsivity:   None   Oppositional/Defiant Behaviors:   None   Emotional Irregularity:   Potentially harmful impulsivity; Mood lability   Other Mood/Personality Symptoms:   Pt is currently manic    Mental Status Exam Appearance and self-care  Stature:   Average   Weight:   Thin   Clothing:   Casual   Grooming:   Normal   Cosmetic use:   None   Posture/gait:   Tense   Motor activity:   Restless   Sensorium  Attention:   Distractible; Confused   Concentration:   Focuses on irrelevancies; Preoccupied; Scattered; Variable   Orientation:   -- (uta due to manic state)   Recall/memory:   Defective in Remote; Defective in Recent; Defective in Short-term; Defective in Immediate (uta due to manic state)   Affect and Mood  Affect:   Inappropriate   Mood:   Angry; Anxious; Irritable   Relating  Eye contact:   Normal   Facial expression:   Responsive; Angry; Anxious   Attitude toward examiner:   Argumentative; Irritable; Threatening; Resistant   Thought and Language  Speech flow:  Flight of Ideas   Thought content:   Appropriate to Mood and Circumstances   Preoccupation:   None; Obsessions; Other (Comment) (uta due to manic state)   Hallucinations:   -- (uta due to manic state)   Organization:   Loose; Disorganized; Audiological scientist of Knowledge:   Average   Intelligence:   Average   Abstraction:   Functional   Judgement:   Impaired   Reality Testing:   Distorted   Insight:   Lacking   Decision Making:   Impulsive   Social Functioning  Social Maturity:   Impulsive   Social Judgement:   Heedless; "Street Smart"   Stress   Stressors:   Family conflict   Coping Ability:   -- (uta due to manic state)   Skill Deficits:   -- Special educational needs teacher)   Supports:   Family     Religion: Religion/Spirituality Are You A Religious Person?:  (uta due to manic state) How Might This Affect Treatment?: n/a  Leisure/Recreation: Leisure / Recreation Do You Have Hobbies?:  Pincus Badder due to manic state)  Exercise/Diet: Exercise/Diet Do You Exercise?:  (uta due to manic state) Have You Gained or Lost A Significant Amount of Weight in the Past Six Months?:  (UTA) Do You Follow a Special  Diet?:  (uta due to manic state) Do You Have Any Trouble Sleeping?:  (uta due to manic state) Explanation of Sleeping Difficulties: n/a   CCA Employment/Education Employment/Work Situation: Employment / Work Situation Employment Situation: Employed Work Stressors: uta due to manic state Patient's Job has Been Impacted by Current Illness:  (uta due to manic state) Has Patient ever Been in the [employer]?:  (uta due to manic state)  Education: Education Is Patient Currently Attending School?:  (uta due to manic state) Last Grade Completed:  (uta due to manic state) Did You Attend College?:  (uta due to manic state) Did You Have An Individualized Education Program (IIEP):  (uta due to manic state) Did You Have Any Difficulty At [employer]?:  (uta due to manic state) Patient's Education Has Been Impacted by Current Illness:  (uta due to manic state)   CCA Family/Childhood History Family and Relationship History: Family history Marital status: Single Does patient have children?:  (uta due to manic state)  Childhood History:  Childhood History By whom was/is the patient raised?: Mother Did patient suffer any verbal/emotional/physical/sexual abuse as a child?:  (uta due to manic state) Did patient suffer from severe childhood neglect?:  (uta due to manic state) Has patient ever been sexually abused/assaulted/raped as an adolescent or adult?:   (uta due to manic state) Was the patient ever a victim of a crime or a disaster?:  (uta due to manic state) Witnessed domestic violence?:  (uta due to manic state) Has patient been affected by domestic violence as an adult?:  (uta due to manic state)       CCA Substance Use Alcohol/Drug Use: Alcohol / Drug Use Pain Medications: See MAR Prescriptions: See MAR Over the Counter: See MAR History of alcohol / drug use?: Yes (uta due to manic state) Longest period of sobriety (when/how long): Unknown Negative Consequences of Use:  (uta due to manic state) Withdrawal Symptoms:  (uta due to manic state)                         ASAM's:  Six Dimensions of Multidimensional Assessment  Dimension 1:  Acute Intoxication and/or Withdrawal Potential:   Dimension 1:  Description of individual's past and current experiences of substance use and withdrawal: uta due to manic state  Dimension 2:  Biomedical Conditions and Complications:   Dimension 2:  Description of patient's biomedical conditions and  complications: uta due to manic state  Dimension 3:  Emotional, Behavioral, or Cognitive Conditions and Complications:  Dimension 3:  Description of emotional, behavioral, or cognitive conditions and complications: uta due to manic state  Dimension 4:  Readiness to Change:  Dimension 4:  Description of Readiness to Change criteria: uta due to manic state  Dimension 5:  Relapse, Continued use, or Continued Problem Potential:  Dimension 5:  Relapse, continued use, or continued problem potential critiera description: uta due to manic state  Dimension 6:  Recovery/Living Environment:  Dimension 6:  Recovery/Iiving environment criteria description: uta due to manic state  ASAM Severity Score:    ASAM Recommended Level of Treatment: ASAM Recommended Level of Treatment:  (uta due to manic state)   Substance use Disorder (SUD) Substance Use Disorder (SUD)  Checklist Symptoms of Substance Use:  (uta  due to manic state)  Recommendations for Services/Supports/Treatments: Recommendations for Services/Supports/Treatments Recommendations For Services/Supports/Treatments: Inpatient Hospitalization, Individual Therapy, Medication Management  Discharge Disposition: Discharge Disposition Medical Exam completed: Yes  DSM5 Diagnoses: Patient Active Problem List  Diagnosis Date Noted   Marijuana use 02/25/2022   Bipolar I disorder, most recent episode (or current) manic ([place]) 02/25/2022   Agitation    Bipolar disorder, current episode manic without psychotic features ([place]) 07/29/2020   Cannabis use disorder, moderate, in controlled environment ([place]) 07/29/2020     Referrals to Alternative Service(s): Referred to Alternative Service(s):   Place:   Date:   Time:    Referred to Alternative Service(s):   Place:   Date:   Time:    Referred to Alternative Service(s):   Place:   Date:   Time:    Referred to Alternative Service(s):   Place:   Date:   Time:     Venora Maples, [facility]

## 2022-03-09 NOTE — ED Provider Notes (Signed)
FBC/OBS ASAP Discharge Summary  Date and Time: 03/09/2022 11:38 AM  Name: Derrick Joseph  MRN:  [id number]   Discharge Diagnoses:  Final diagnoses:  Bipolar I disorder, most recent episode (or current) manic ([place])   HPI: Derrick Joseph is a 22 year old male with psychiatric history significant for bipolar disorder, cannabis use disorder, and agitation.  Patient was brought to [facility]  by law enforcement under IVC petitioned by his mother.He was admitted to the continuous assessment unit and recommended for IP admission.   Per IVC: Respondent has been diagnosed with bipolar mania. Respondent has stated that he has been awake for 3 days straight. Respondent was recently released from [facility]. Respondent tried to overdose on his meds saying the took 10 pills. When he should have only take 1. Respondent states that he is having racing thoughts. Respondent also stated that he feels unsafe. Respondent is having delusional and grandiose thoughts stating he is over the police and they can't do anything to him. Without intervention the respondent could harm himself.   Patient seen face to face by this provider and chart reviewed on 03/09/2022  Subjective: Patient is observed standing at the plexi glass at the nurses station. He is agitated. His speech is pressured, fast and tangential. He is disorganized in his thought process and difficult to follow. He is difficult to redirect. He grandiose and is delusional, He believes he owns the [employer] He appears manic. Per nursing patient received Geodon IM for agitation and he has still not slept. He denies Si/HI/AVH. He does not appear to be responding to internal/external stimuli.   Stay Summary: Patient meets criteria for IP admission. He has been accepted to [facility]. IVC will remain in place.   Total Time spent with patient: 30 minutes  Past Psychiatric History: as documented in h&P Past Medical History: as documented in h&P Family History: as  documented in h&P Family Psychiatric History: as documented in h&P Social History: as documented in h&P Tobacco Cessation:  N/A, patient does not currently use tobacco products  Current Medications:  Current Facility-Administered Medications  Medication Dose Route Frequency Provider Last Rate Last Admin   acetaminophen (TYLENOL) tablet 650 mg  650 mg Oral Q6H PRN Ajibola, Ene A, NP       alum & mag hydroxide-simeth (MAALOX/MYLANTA) 200-200-20 MG/5ML suspension 30 mL  30 mL Oral Q4H PRN Ajibola, Ene A, NP       hydrOXYzine (ATARAX) tablet 25 mg  25 mg Oral TID PRN Ajibola, Ene A, NP   25 mg at 03/09/22 0849   magnesium hydroxide (MILK OF MAGNESIA) suspension 30 mL  30 mL Oral Daily PRN Ajibola, Ene A, NP       OLANZapine zydis (ZYPREXA) disintegrating tablet 10 mg  10 mg Oral QHS Revonda Humphrey, NP       OLANZapine zydis (ZYPREXA) disintegrating tablet 5 mg  5 mg Oral Daily Revonda Humphrey, NP   5 mg at 03/09/22 0847   traZODone (DESYREL) tablet 50 mg  50 mg Oral QHS PRN Ajibola, Ene A, NP       ziprasidone (GEODON) injection 20 mg  20 mg Intramuscular Q12H PRN Ajibola, Ene A, NP   20 mg at 03/09/22 [id number]   Current Outpatient Medications  Medication Sig Dispense Refill   OLANZapine zydis (ZYPREXA) 10 MG disintegrating tablet Take 1 tablet (10 mg total) by mouth at bedtime.     [START ON 03/10/2022] OLANZapine zydis (ZYPREXA) 5 MG disintegrating tablet Take  1 tablet (5 mg total) by mouth daily.      PTA Medications:  Facility Ordered Medications  Medication   acetaminophen (TYLENOL) tablet 650 mg   alum & mag hydroxide-simeth (MAALOX/MYLANTA) 200-200-20 MG/5ML suspension 30 mL   magnesium hydroxide (MILK OF MAGNESIA) suspension 30 mL   hydrOXYzine (ATARAX) tablet 25 mg   traZODone (DESYREL) tablet 50 mg   ziprasidone (GEODON) injection 20 mg   [COMPLETED] LORazepam (ATIVAN) injection 1 mg   OLANZapine zydis (ZYPREXA) disintegrating tablet 5 mg   OLANZapine zydis (ZYPREXA)  disintegrating tablet 10 mg   [COMPLETED] LORazepam (ATIVAN) tablet 2 mg   PTA Medications  Medication Sig   OLANZapine zydis (ZYPREXA) 10 MG disintegrating tablet Take 1 tablet (10 mg total) by mouth at bedtime.        No data to display          [place] ED from 02/25/2022 in [facility] Emergency Department at [facility] ED from 07/04/2021 in [facility] Urgent Care at [facility] ED from 08/20/2020 in [facility] Emergency Department at [place] High Risk No Risk Error: Question 6 not populated       Musculoskeletal  Strength & Muscle Tone: within normal limits Gait & Station: normal Patient leans: N/A  Psychiatric Specialty Exam  Presentation  General Appearance:  Appropriate for Environment  Eye Contact: Good  Speech: Pressured  Speech Volume: Increased  Handedness: Right   Mood and Affect  Mood: Irritable  Affect: Congruent   Thought Process  Thought Processes: Disorganized  Descriptions of Associations:Tangential  Orientation:Full (Time, Place and Person)  Thought Content:Delusions; Tangential  Diagnosis of Schizophrenia or Schizoaffective disorder in past: No  Duration of Psychotic Symptoms: Less than six months   Hallucinations:Hallucinations: None  Ideas of Reference:None  Suicidal Thoughts:Suicidal Thoughts: No  Homicidal Thoughts:Homicidal Thoughts: No HI Active Intent and/or Plan: Without Plan   Sensorium  Memory: Recent Poor; Immediate Poor  Judgment: Impaired  Insight: Lacking   Executive Functions  Concentration: Poor  Attention Span: Poor  Recall: Poor  Fund of Knowledge: Poor  Language: Poor   Psychomotor Activity  Psychomotor Activity: Psychomotor Activity: Restlessness   Assets  Assets: Physical Health; Housing; Resilience; Social Support   Sleep  Sleep: Sleep: Poor Number of Hours of Sleep: 1   Nutritional Assessment (For OBS and FBC  admissions only) Has the patient had a weight loss or gain of 10 pounds or more in the last 3 months?: -- (pt is unable to answer) Has the patient had a decrease in food intake/or appetite?: -- (pt is unable to answer) Does the patient have dental problems?: -- (pt is unable to answer) Does the patient have eating habits or behaviors that may be indicators of an eating disorder including binging or inducing vomiting?: -- (pt is unable to answer) Has the patient recently lost weight without trying?: -- (pt is unable to answer) Has the patient been eating poorly because of a decreased appetite?: -- (pt is unable to answer)    Physical Exam  Physical Exam Vitals and nursing note reviewed.  Eyes:     General:        Right eye: No discharge.        Left eye: No discharge.  Cardiovascular:     Rate and Rhythm: Normal rate.  Pulmonary:     Effort: Pulmonary effort is normal. No respiratory distress.  Musculoskeletal:        General: Normal range of  motion.     Cervical back: Normal range of motion.  Skin:    Coloration: Skin is not jaundiced or pale.  Neurological:     Mental Status: He is alert and oriented to person, place, and time.  Psychiatric:        Attention and Perception: He is inattentive.        Mood and Affect: Mood is anxious. Affect is labile and angry.        Speech: Speech is rapid and pressured and tangential.        Behavior: Behavior is agitated and hyperactive.        Thought Content: Thought content is delusional.        Cognition and Memory: Cognition normal.        Judgment: Judgment is impulsive.   Review of Systems  Constitutional: Negative.  Negative for fever.  HENT: Negative.  Negative for hearing loss.   Eyes: Negative.   Respiratory: Negative.  Negative for cough.   Cardiovascular: Negative.   Musculoskeletal: Negative.   Skin: Negative.   Neurological: Negative.   Psychiatric/Behavioral:  The patient is nervous/anxious.    Blood pressure  139/87, pulse 91, temperature 98.3 F (36.8 C), resp. rate 18, SpO2 100 %. There is no height or weight on file to calculate BMI.  Disposition:   Discharge and readmit patient to [facility] for IP admission.   IVC will remain in place.   Revonda Humphrey, NP 03/09/2022, 11:38 AM

## 2022-03-09 NOTE — Group Note (Deleted)
Recreation Therapy Group Note   Group Topic:Coping Skills  Group Date: 03/09/2022 Start Time: 1015 End Time: 1045 Facilitators: Bentlee Benningfield-McCall, Edmund Rick A, NT Location: [address] Dayroom       Affect/Mood: {RT BHH Affect/Mood:26271}   Participation Level: {RT BHH Participation [name]   Participation Quality: {RT BHH Participation Quality:26268}   Behavior: {RT BHH Group Behavior:26269}   Speech/Thought Process: {RT BHH Speech/Thought:26276}   Insight: {RT BHH Insight:26272}   Judgement: {RT BHH Judgement:26278}   Modes of Intervention: {RT BHH Modes of Intervention:26277}   Patient Response to Interventions:  {RT BHH Patient Response to Intervention:26274}   Education Outcome:  {RT [place] Education Outcome:26279}   Clinical Observations/Individualized Feedback: *** was *** in their participation of session activities and group discussion. Pt identified ***   Plan: {RT BHH Tx Plan:26280}   [name], NT,  03/09/2022 1:16 PM

## 2022-03-09 NOTE — ED Notes (Signed)
Pt demanding discharge. Pt states '' I need to get the fuck up out of here. I have pull with the sheriff , call officer harden, he knows me. I have millions. '' Staff continue to redirect.

## 2022-03-09 NOTE — ED Notes (Signed)
Pt requesting to shower, provided hygiene items and towels . Ambulatory in no acute distres.Marland Kitchen

## 2022-03-09 NOTE — Discharge Instructions (Signed)
Transfer to [facility] For IP admission. Dr. Renard Hamper is accpeting md

## 2022-03-09 NOTE — Progress Notes (Signed)
Pt was encouraged but refused to attend group discussion

## 2022-03-09 NOTE — ED Notes (Signed)
Pt awake and pushed in recliner chair.  Then set self on the floor.  Pt currently sleeping.

## 2022-03-09 NOTE — Progress Notes (Signed)
Pt was accepted to [facility] 03/09/2022, pending EKG. Bed assignment: [id number]  Pt meets inpatient criteria per Thomes Lolling, NP  Attending Physician will be Janine Limbo, MD  Report can be called to: - Adult unit: [phone]  Pt can arrive after 12 PM  Care Team Notified: [facility] Lynnda Shields, RN, Thomes Lolling, NP, and Leonia Reader, RN  [place], [place]  03/09/2022 11:19 AM

## 2022-03-09 NOTE — Progress Notes (Signed)
   03/09/22 0250  [place] (Walk-ins at [facility] only)  How Did You Hear About [place]? Legal System  What Is the Reason for Your Visit/Call Today? Derrick Joseph is a 22 year old male presenting under IVC to [facility] due to manic behaviors. Patient is currently manic and it is difficult to maintain a conversation with him and to have him answer questions. Patient has flight of ideas. Multiple questions attempted, patient continues to ramble and states "I'm my own shit, my own boss, I own [employer]". Patient refuses to talk with NP and TTS clinician, stating "I don't want to talk to you right now". Patient continues to ramble about owning everything, houses, cars, etc. Patient states "guess how much I made off [place] last year, I made $40 billion off them last year". Patient then started praying for himself and the [facility] building. Due to manic behaviors, flight of ideas, TTS clinician was unable to assess.     IVC, petitioner, mother, [name] [phone]  Respondent has been diagnosed with bipolar mania. Respondent has stated that he has been awake for 3 days straight. Respondent was recently released from [facility]. Respondent tried to overdose on his meds saying the took 10 pills. When he should have only take 1. Respondent states that he is having racing thoughts. Respondent also stated that he feels unsafe. Respondent is having delusional and grandiose thoughts stating he is over the police and they can't do anything to him. Without intervention the respondent could harm himself.  How Long Has This Been Causing You Problems?  (uta due to manic state)  Have You Recently Had Any Thoughts About Hurting Yourself?  (uta due to manic state)  Are You Planning to Commit Suicide/Harm Yourself At This time?  (uta due to manic state)  Have you Recently Had Thoughts About H. Rivera Colon?  Pincus Badder due to manic state)  Are You Planning To Harm Someone At This Time?  (uta due to manic state)  Are  you currently experiencing any auditory, visual or other hallucinations?  (uta due to manic state)  Have You Used Any Alcohol or Drugs in the Past 24 Hours?  (uta due to manic state)  Do you have any current medical co-morbidities that require immediate attention?  (uta due to manic state)  Clinician description of patient physical appearance/behavior: neat / manic  What Do You Feel Would Help You the Most Today? Treatment for Depression or other mood problem  If access to [facility] was not available, would you have sought care in the Emergency Department? Yes  Determination of Need Emergent (2 hours)  Options For Referral Inpatient Hospitalization;Medication Management;Outpatient Therapy    Flowsheet Row ED from 02/25/2022 in [facility] Emergency Department at [facility] ED from 07/04/2021 in [facility] at [facility] ED from 08/20/2020 in [facility] Emergency Department at [place] High Risk No Risk Error: Question 6 not populated      NOTE: Due to patients current manic behaviors, unable to assess.

## 2022-03-09 NOTE — Progress Notes (Signed)
Transport called for [employer] transport to [facility].

## 2022-03-10 ENCOUNTER — Encounter (HOSPITAL_COMMUNITY): Payer: Self-pay | Admitting: Psychiatry

## 2022-03-10 DIAGNOSIS — F3110 Bipolar disorder, current episode manic without psychotic features, unspecified: Secondary | ICD-10-CM

## 2022-03-10 LAB — COMPREHENSIVE METABOLIC PANEL
ALT: 33 U/L (ref 0–44)
AST: 42 U/L — ABNORMAL HIGH (ref 15–41)
Albumin: 4.2 g/dL (ref 3.5–5.0)
Alkaline Phosphatase: 66 U/L (ref 38–126)
Anion gap: 11 (ref 5–15)
BUN: 12 mg/dL (ref 6–20)
CO2: 24 mmol/L (ref 22–32)
Calcium: 9.1 mg/dL (ref 8.9–10.3)
Chloride: 103 mmol/L (ref 98–111)
Creatinine, Ser: 1.3 mg/dL — ABNORMAL HIGH (ref 0.61–1.24)
GFR, Estimated: >60 mL/min (ref >60)
Glucose, Bld: 83 mg/dL (ref 70–99)
Potassium: 4.5 mmol/L (ref 3.5–5.1)
Sodium: 138 mmol/L (ref 135–145)
Total Bilirubin: 0.3 mg/dL (ref 0.3–1.2)
Total Protein: 7.5 g/dL (ref 6.5–8.1)

## 2022-03-10 LAB — URINALYSIS, COMPLETE (UACMP) WITH MICROSCOPIC
Bacteria, UA: NONE SEEN
Bilirubin Urine: NEGATIVE
Glucose, UA: NEGATIVE mg/dL
Hgb urine dipstick: NEGATIVE
Ketones, ur: NEGATIVE mg/dL
Leukocytes,Ua: NEGATIVE
Nitrite: NEGATIVE
Protein, ur: NEGATIVE mg/dL
Specific Gravity, Urine: 1.014 (ref 1.005–1.030)
pH: 6 (ref 5.0–8.0)

## 2022-03-10 LAB — CBC
HCT: 43.4 % (ref 39.0–52.0)
Hemoglobin: 13.7 g/dL (ref 13.0–17.0)
MCH: 27.6 pg (ref 26.0–34.0)
MCHC: 31.6 g/dL (ref 30.0–36.0)
MCV: 87.5 fL (ref 80.0–100.0)
Platelets: 289 10*3/uL (ref 150–400)
RBC: 4.96 MIL/uL (ref 4.22–5.81)
RDW: 13.6 % (ref 11.5–15.5)
WBC: 4 10*3/uL (ref 4.0–10.5)
nRBC: 0 % (ref 0.0–0.2)

## 2022-03-10 LAB — RAPID URINE DRUG SCREEN, HOSP PERFORMED
Amphetamines: NOT DETECTED
Barbiturates: NOT DETECTED
Benzodiazepines: POSITIVE — AB
Cocaine: NOT DETECTED
Opiates: NOT DETECTED
Tetrahydrocannabinol: POSITIVE — AB

## 2022-03-10 LAB — ETHANOL: Alcohol, Ethyl (B): <10 mg/dL (ref <10)

## 2022-03-10 LAB — TSH: TSH: 1.385 u[IU]/mL (ref 0.350–4.500)

## 2022-03-10 MED ORDER — NICOTINE POLACRILEX 2 MG MT GUM
CHEWING_GUM | OROMUCOSAL | Status: AC
  Filled 2022-03-10: qty 1

## 2022-03-10 MED ORDER — NICOTINE POLACRILEX 2 MG MT GUM
2.0000 mg | CHEWING_GUM | OROMUCOSAL | Status: DC | PRN
  Administered 2022-03-10 – 2022-03-17 (×14): 2 mg via ORAL
  Filled 2022-03-10 (×10): qty 1

## 2022-03-10 MED ORDER — BENZTROPINE MESYLATE 1 MG PO TABS
1.0000 mg | ORAL_TABLET | Freq: Two times a day (BID) | ORAL | Status: DC
  Administered 2022-03-10 – 2022-03-17 (×14): 1 mg via ORAL
  Filled 2022-03-10 (×18): qty 1

## 2022-03-10 MED ORDER — HALOPERIDOL 5 MG PO TABS
5.0000 mg | ORAL_TABLET | Freq: Two times a day (BID) | ORAL | Status: DC
  Administered 2022-03-10 – 2022-03-12 (×4): 5 mg via ORAL
  Filled 2022-03-10 (×6): qty 1

## 2022-03-10 NOTE — Progress Notes (Signed)
Adult Psychoeducational Group Note  Date:  03/10/2022 Time:  8:36 PM  Group Topic/Focus:  Wrap-Up Group:   The focus of this group is to help patients review their daily goal of treatment and discuss progress on daily workbooks.  Participation Level:  Did Not Attend  Participation Quality:   Did Not Attend  Affect:   Did Not Attend  Cognitive:   Did Not Attend  Insight: None  Engagement in Group:   Did Not Attend  Modes of Intervention:   Did Not Attend  Additional Comments:  Pt was encouraged to attend wrap up group but did not attend.  Candy Sledge 03/10/2022, 8:36 PM

## 2022-03-10 NOTE — Progress Notes (Signed)
Urine specimen cup given to patient for urine specimen.

## 2022-03-10 NOTE — Progress Notes (Signed)
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  Pt also stated he travels everyweek and "nobody is ready for me".)  Reason for Admission (Per Patient): Other (Comments) ("I don't know")  Patient Stressors: Other (Comment) (None)  Coping Skills:   Journal, Music, Meditate, Exercise, Talk, Prayer, Avoidance  Leisure Interests (2+):  Community - Travel (Comment)  Frequency of Recreation/Participation: Weekly  Awareness of Community Resources:  Yes  Community Resources:  [employer], [employer], Art therapist, Engineer, drilling, [employer], [place], [employer], [place], [place], [place], [employer], [place], [place], Patent examiner, Other (Comment) ("Everything")  Current Use: Yes  If no, Barriers?:    Expressed Interest in [place]: No  [place]

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
## 2022-03-10 NOTE — Group Note (Signed)
Recreation Therapy Group Note   Group Topic:Team Building  Group Date: 03/10/2022 Start Time: 1000 End Time: 1035 Facilitators: Brenton Joines-McCall, LRT,CTRS Location: 500 Hall Dayroom   Goal Area(s) Addresses:  Patient will effectively work with peer towards shared goal.  Patient will identify skills used to make activity successful.  Patient will share challenges and verbalize solution-driven approaches used. Patient will identify how skills used during activity can be used to reach post d/c goals.   Group Description: [employer]. Patients were provided the following materials: 4 drinking straws, 5 rubber bands, 5 paper clips, 2 index cards and 2 drinking cups. Using the provided materials patients were asked to build a launching mechanism to launch a ping pong ball across the room, approximately 10 feet. Patients were divided into teams of 3-5. Instructions required all materials be incorporated into the device, functionality of items left to the peer group's discretion.   Affect/Mood: N/A   Participation Level: Did not attend    Clinical Observations/Individualized Feedback:     Plan: Continue to engage patient in RT group sessions 2-3x/week.   Aranza Geddes-McCall, LRT,CTRS  03/10/2022 12:17 PM
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
## 2022-03-10 NOTE — Progress Notes (Signed)
Adult Psychoeducational Group Note  Date:  03/10/2022 Time:  5:11 PM  Group Topic/Focus:  Goals Group:   The focus of this group is to help patients establish daily goals to achieve during treatment and discuss how the patient can incorporate goal setting into their daily lives to aide in recovery. Orientation:   The focus of this group is to educate the patient on the purpose and policies of crisis stabilization and provide a format to answer questions about their admission.  The group details unit policies and expectations of patients while admitted.  Participation Level:    Participation Quality:    Affect:    Cognitive:    Insight:   Engagement in Group:    Modes of Intervention:    Additional Comments:  Pt did not attend orientation/goals group.  Derrick Joseph 03/10/2022, 5:11 PM

## 2022-03-10 NOTE — Group Note (Signed)
Date:  03/10/2022 Time:  5:19 PM  Group Topic/Focus:  Spirituality:   The focus of this group is to discuss how one's spirituality can aide in recovery.    Participation Level:  Did Not Attend  Participation Quality:    Affect:    Cognitive:    Insight:   Engagement in Group:    Modes of Intervention:    Additional Comments: Pt attended the wellness group and remained appropriate and engaged throughout the duration of the group.   Beryle Beams 03/10/2022, 5:19 PM

## 2022-03-10 NOTE — Progress Notes (Signed)
Pt has been up since midnight, trazodone 50 mg, Atarax 25 mg given with no effect. Pt kept on getting irritable and agitated stated he wants to get out of here because has things that he needs to do. Pt given Ativan 2 mg po at this time, will continue to monitor.
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
## 2022-03-10 NOTE — Progress Notes (Signed)
Patient has been irritable this morning, he is redirectable but continues to speak of trying to escape the unit, has been slamming his door often since the beginning of the shift. Patient has been redirected. In his room eating lunch at this time.

## 2022-03-10 NOTE — Plan of Care (Signed)
?  Problem: Education: ?Goal: Knowledge of [facility] Education information/materials will improve ?Outcome: Not Progressing ?Goal: Emotional status will improve ?Outcome: Not Progressing ?Goal: Mental status will improve ?Outcome: Not Progressing ?Goal: Verbalization of understanding the information provided will improve ?Outcome: Not Progressing ?  ?Problem: Activity: ?Goal: Interest or engagement in activities will improve ?Outcome: Not Progressing ?Goal: Sleeping patterns will improve ?Outcome: Not Progressing ?  ?Problem: Coping: ?Goal: Ability to verbalize frustrations and anger appropriately will improve ?Outcome: Not Progressing ?Goal: Ability to demonstrate self-control will improve ?Outcome: Not Progressing ?  ?Problem: Health Behavior/Discharge Planning: ?Goal: Identification of resources available to assist in meeting health care needs will improve ?Outcome: Not Progressing ?Goal: Compliance with treatment plan for underlying cause of condition will improve ?Outcome: Not Progressing ?  ?Problem: Physical Regulation: ?Goal: Ability to maintain clinical measurements within normal limits will improve ?Outcome: Not Progressing ?  ?Problem: Safety: ?Goal: Periods of time without injury will increase ?Outcome: Not Progressing ?  ?

## 2022-03-10 NOTE — Progress Notes (Signed)
D: Patient alert and oriented. Denies SI, HI, AVH, and pain. Patient is obviously responding to internal stimuli. He has been labile today and irritable at times, slamming doors but he is redirectable. He is currently on unit restriction due to unpredictable behavior.   A: Scheduled medication administered to patient, per MD orders. Support and encouragement provided. Routine safety checks conducted every 15 minutes. Patient informed to notify staff with problems or concerns.   R: No adverse drug reactions noted. Patient contracts for safety at this time. Patient compliant with medications and treatment plan. Patient interacts well with others on unit. Patient remains safe at this time.

## 2022-03-10 NOTE — Progress Notes (Signed)
Adult Psychoeducational Group Note  Date:  03/10/2022 Time:  5:12 PM  Group Topic/Focus: Trust   Participation Level:  Did Not Attend  Participation Quality:    Affect:    Cognitive:    Insight:   Engagement in Group:    Modes of Intervention:    Additional Comments:  Pt did not attend the trust group  Derrick Joseph 03/10/2022, 5:12 PM

## 2022-03-10 NOTE — Progress Notes (Signed)
   03/09/22 2100  Psych Admission Type (Psych Patients Only)  Admission Status Involuntary  Psychosocial Assessment  Patient Complaints Anxiety;Depression  Eye Contact Avertive  Facial Expression Anxious  Affect Appropriate to circumstance  Speech Logical/coherent  Interaction Assertive  Motor Activity Slow  Appearance/Hygiene Unremarkable  Behavior Characteristics Anxious  Mood Depressed  Thought Process  Coherency Tangential  Content Religiosity  Delusions Grandeur  Perception Depersonalization  Hallucination None reported or observed  Judgment Poor  Confusion Mild  Danger to Self  Current suicidal ideation? Denies  Danger to Others  Danger to Others None reported or observed

## 2022-03-11 ENCOUNTER — Encounter (HOSPITAL_COMMUNITY): Payer: Self-pay

## 2022-03-11 LAB — HEMOGLOBIN A1C
Hgb A1c MFr Bld: 5.6 % (ref 4.8–5.6)
Mean Plasma Glucose: 114 mg/dL

## 2022-03-11 LAB — LIPID PANEL
Cholesterol: 106 mg/dL (ref 0–200)
HDL: 38 mg/dL — ABNORMAL LOW (ref >40)
LDL Cholesterol: 58 mg/dL (ref 0–99)
Total CHOL/HDL Ratio: 2.8 RATIO
Triglycerides: 52 mg/dL (ref <150)
VLDL: 10 mg/dL (ref 0–40)

## 2022-03-11 LAB — MAGNESIUM: Magnesium: 2.1 mg/dL (ref 1.7–2.4)

## 2022-03-11 MED ORDER — TRAZODONE HCL 100 MG PO TABS
100.0000 mg | ORAL_TABLET | Freq: Every evening | ORAL | Status: DC | PRN
  Administered 2022-03-11 – 2022-03-16 (×6): 100 mg via ORAL
  Filled 2022-03-11 (×6): qty 1

## 2022-03-11 MED ORDER — DIVALPROEX SODIUM 500 MG PO DR TAB
500.0000 mg | DELAYED_RELEASE_TABLET | Freq: Two times a day (BID) | ORAL | Status: DC
  Administered 2022-03-11 – 2022-03-17 (×13): 500 mg via ORAL
  Filled 2022-03-11 (×19): qty 1

## 2022-03-11 NOTE — Progress Notes (Signed)
Adult Psychoeducational Group Note  Date:  03/11/2022 Time:  8:22 PM  Group Topic/Focus:  Wrap-Up Group:   The focus of this group is to help patients review their daily goal of treatment and discuss progress on daily workbooks.  Participation Level:  Active  Participation Quality:  Appropriate and Redirectable  Affect:  Appropriate  Cognitive:  Delusional  Insight: Good  Engagement in Group:  Engaged  Modes of Intervention:  Discussion  Additional Comments:   Pt states that he's had a good day and has made new friends with a few of his peers on the unit. Pt has been trying to call his mother for the past few days and finally got in touch with her today. Pt was removed from his UR today and was able to go to the gym and meals. Pt denies everything.   Gerhard Perches 03/11/2022, 8:22 PM

## 2022-03-11 NOTE — Progress Notes (Signed)
   03/10/22 2000  Psych Admission Type (Psych Patients Only)  Admission Status Involuntary  Psychosocial Assessment  Patient Complaints Anxiety  Eye Contact Avertive  Facial Expression Anxious  Affect Appropriate to circumstance  Speech Logical/coherent  Interaction Assertive  Motor Activity Slow  Appearance/Hygiene Unremarkable  Behavior Characteristics Anxious;Intrusive  Mood Labile  Thought Process  Coherency Tangential  Content Religiosity  Delusions Grandeur  Perception Depersonalization  Hallucination None reported or observed  Judgment Poor  Confusion Mild  Danger to Self  Current suicidal ideation? Denies  Danger to Others  Danger to Others None reported or observed

## 2022-03-11 NOTE — [facility] Group Notes (Signed)
Adult Psychoeducational Group Note  Date:  03/11/2022 Time:  1:46 PM  Group Topic/Focus:  Managing Feelings:   The focus of this group is to identify what feelings patients have difficulty handling and develop a plan to handle them in a healthier way upon discharge.  Participation Level:  Active  Participation Quality:  Appropriate  Affect:  Appropriate  Cognitive:  Appropriate  Insight: Appropriate  Engagement in Group:  Engaged  Modes of Intervention:  Discussion  Additional Comments:  Patient attended and participated in the positive emotions group.  Annie Sable 03/11/2022, 1:46 PM

## 2022-03-11 NOTE — BH IP Treatment Plan (Signed)
Interdisciplinary Treatment and Diagnostic Plan Update  03/11/2022 Time of Session: 9:40am  Derrick Joseph MRN: [id number]  Principal Diagnosis: Bipolar I disorder, most recent episode (or current) manic ([place])  Secondary Diagnoses: Principal Problem:   Bipolar I disorder, most recent episode (or current) manic ([place]) Active Problems:   Bipolar 1 disorder ([place])   Current Medications:  Current Facility-Administered Medications  Medication Dose Route Frequency Provider Last Rate Last Admin   acetaminophen (TYLENOL) tablet 650 mg  650 mg Oral Q6H PRN Revonda Humphrey, NP   650 mg at 03/11/22 1055   alum & mag hydroxide-simeth (MAALOX/MYLANTA) 200-200-20 MG/5ML suspension 30 mL  30 mL Oral Q4H PRN Revonda Humphrey, NP       benztropine (COGENTIN) tablet 1 mg  1 mg Oral BID Carrion-Carrero, Margely, MD   1 mg at 03/11/22 0820   diphenhydrAMINE (BENADRYL) capsule 50 mg  50 mg Oral TID PRN Revonda Humphrey, NP   50 mg at 03/10/22 2209   Or   diphenhydrAMINE (BENADRYL) injection 50 mg  50 mg Intramuscular TID PRN Revonda Humphrey, NP       divalproex (DEPAKOTE) DR tablet 500 mg  500 mg Oral Q12H Carrion-Carrero, Margely, MD   500 mg at 03/11/22 1009   haloperidol (HALDOL) tablet 5 mg  5 mg Oral TID PRN Revonda Humphrey, NP   5 mg at 03/09/22 1356   Or   haloperidol lactate (HALDOL) injection 5 mg  5 mg Intramuscular TID PRN Revonda Humphrey, NP       haloperidol (HALDOL) tablet 5 mg  5 mg Oral BID Carrion-Carrero, Caryl Ada, MD   5 mg at 03/11/22 0820   hydrOXYzine (ATARAX) tablet 25 mg  25 mg Oral TID PRN Revonda Humphrey, NP   25 mg at 03/11/22 [id number]   LORazepam (ATIVAN) tablet 2 mg  2 mg Oral TID PRN Revonda Humphrey, NP   2 mg at 03/11/22 [id number]   Or   LORazepam (ATIVAN) injection 2 mg  2 mg Intramuscular TID PRN Revonda Humphrey, NP       magnesium hydroxide (MILK OF MAGNESIA) suspension 30 mL  30 mL Oral Daily PRN Revonda Humphrey, NP       nicotine polacrilex (NICORETTE) gum  2 mg  2 mg Oral PRN Massengill, Ovid Curd, MD   2 mg at 03/11/22 0853   traZODone (DESYREL) tablet 100 mg  100 mg Oral QHS PRN Carrion-Carrero, Caryl Ada, MD       PTA Medications: Medications Prior to Admission  Medication Sig Dispense Refill Last Dose   OLANZapine zydis (ZYPREXA) 10 MG disintegrating tablet Take 1 tablet (10 mg total) by mouth at bedtime.      OLANZapine zydis (ZYPREXA) 5 MG disintegrating tablet Take 1 tablet (5 mg total) by mouth daily.       Patient Stressors:    Patient Strengths:    Treatment Modalities: Medication Management, Group therapy, Case management,  1 to 1 session with clinician, Psychoeducation, Recreational therapy.   Physician Treatment Plan for Primary Diagnosis: Bipolar I disorder, most recent episode (or current) manic ([place]) Long Term Goal(s): Improvement in symptoms so as ready for discharge   Short Term Goals:   Medication Management: Evaluate patient's response, side effects, and tolerance of medication regimen.  Therapeutic Interventions: 1 to 1 sessions, Unit Group sessions and Medication administration.  Evaluation of Outcomes: Not Met  Physician Treatment Plan for Secondary Diagnosis: Principal Problem:   Bipolar I disorder, most recent episode (  or current) manic ([place]) Active Problems:   Bipolar 1 disorder ([place])  Long Term Goal(s): Improvement in symptoms so as ready for discharge   Short Term Goals:     Medication Management: Evaluate patient's response, side effects, and tolerance of medication regimen.  Therapeutic Interventions: 1 to 1 sessions, Unit Group sessions and Medication administration.  Evaluation of Outcomes: Not Met   RN Treatment Plan for Primary Diagnosis: Bipolar I disorder, most recent episode (or current) manic ([place]) Long Term Goal(s): Knowledge of disease and therapeutic regimen to maintain health will improve  Short Term Goals: Ability to remain free from injury will improve, Ability to participate in  decision making will improve, Ability to verbalize feelings will improve, Ability to disclose and discuss suicidal ideas, and Ability to identify and develop effective coping behaviors will improve  Medication Management: RN will administer medications as ordered by provider, will assess and evaluate patient's response and provide education to patient for prescribed medication. RN will report any adverse and/or side effects to prescribing provider.  Therapeutic Interventions: 1 on 1 counseling sessions, Psychoeducation, Medication administration, Evaluate responses to treatment, Monitor vital signs and CBGs as ordered, Perform/monitor CIWA, COWS, AIMS and Fall Risk screenings as ordered, Perform wound care treatments as ordered.  Evaluation of Outcomes: Not Met   LCSW Treatment Plan for Primary Diagnosis: Bipolar I disorder, most recent episode (or current) manic ([place]) Long Term Goal(s): Safe transition to appropriate next level of care at discharge, Engage patient in therapeutic group addressing interpersonal concerns.  Short Term Goals: Engage patient in aftercare planning with referrals and resources, Increase social support, Increase emotional regulation, Facilitate acceptance of mental health diagnosis and concerns, Identify triggers associated with mental health/substance abuse issues, and Increase skills for wellness and recovery  Therapeutic Interventions: Assess for all discharge needs, 1 to 1 time with Social worker, Explore available resources and support systems, Assess for adequacy in community support network, Educate family and significant other(s) on suicide prevention, Complete Psychosocial Assessment, Interpersonal group therapy.  Evaluation of Outcomes: Not Met   Progress in Treatment: Attending groups: Yes. Participating in groups: Yes. Taking medication as prescribed: Yes. Toleration medication: Yes. Family/Significant other contact made: Yes, individual(s) contacted:   Mother  Patient understands diagnosis: No. Discussing patient identified problems/goals with staff: Yes. Medical problems stabilized or resolved: Yes. Denies suicidal/homicidal ideation: Yes. Issues/concerns per patient self-inventory: No.   New problem(s) identified: No, Describe:  None   New Short Term/Long Term Goal(s): medication stabilization, elimination of SI thoughts, development of comprehensive mental wellness plan.   Patient Goals: "To go home"  Discharge Plan or Barriers: Patient recently admitted. CSW will continue to follow and assess for appropriate referrals and possible discharge planning.   Reason for Continuation of Hospitalization: Anxiety Mania Medication stabilization  Estimated Length of Stay: 3 to 7 days   Last 3 [place] Suicide Severity Risk Score: Flowsheet Row Admission (Current) from 03/09/2022 in [facility] ED from 02/25/2022 in [facility] Emergency Department at [facility] ED from 07/04/2021 in [facility] [place] No Risk High Risk No Risk       Last PHQ 2/9 Scores:     No data to display          Scribe for Treatment Team: Darleen Crocker, Latanya Presser 03/11/2022 1:27 PM
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
## 2022-03-11 NOTE — Progress Notes (Signed)
Pt has been up down most of the night, constantly asking for medication. Pt is somatic, complaining headache, sore throat, cough, insomnia, anxiety. Pt asked to be swabbed for Covid, pt refused  stating "if you you touch me, I will sue you people.' Pt is redirectable, will continue to monitor.

## 2022-03-11 NOTE — Group Note (Signed)
Recreation Therapy Group Note   Group Topic:Other  Group Date: 03/11/2022 Start Time: 1000 End Time: 1100 Facilitators: Sherrita Riederer-McCall, LRT,CTRS Location: 500 Hall Dayroom   Goal Area(s) Addresses:  Patient will identify positive affects of music. Patient will identify benefits of using music as a stress reliever post d/c.  Group Description: Music Therapy.  LRT and patients discussed how music can impact someone's life.  The group discussed how music can illicit different emotions.  Patients then took turns requesting songs to be played during group session. LRT would play the songs requested as long as the songs were clean and appropriate.     Affect/Mood: Appropriate   Participation Level: Engaged   Participation Quality: Independent   Behavior: Appropriate   Speech/Thought Process: Delusional and Focused   Insight: Good   Judgement: Good   Modes of Intervention: Music   Patient Response to Interventions:  Engaged   Education Outcome:  Acknowledges education and In group clarification offered    Clinical Observations/Individualized Feedback: Pt was appropriate during group session.  Pt interacted well with peers. Pt would also make random comments about being a rapper or being related to certain rappers.  Pt also talked about making music.  Pt rap along to some of the songs and was respectful to peers during group.    Plan: Continue to engage patient in RT group sessions 2-3x/week.   Galilea Quito-McCall, LRT,CTRS 03/11/2022 12:35 PM

## 2022-03-11 NOTE — Progress Notes (Signed)
Pt A & O to self, place and events. Denies SI, HI, AVH and pain. However, pt remains preoccupied, delusional and grandeur in nature, very tangential with pressured speech. 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 Keeping me in here is keeping me from my business partners. The medicines y'all giving me is not even working like what my mom gave me at home. I'm going to sue y'all. I already sue [facility]". Continues to make multiple demands for medications and discharge. Support, encouragement and reassurance provided to pt. Verbal education done on current treatment regimen including dosing schedules and discharge process. Safety checks maintained at Q 15 minutes intervals. Attended scheduled groups, participated in discussion. Tried off unit for lunch and gym, returned without issues. Pt tolerated meals, fluids and medications well without issues. Safety maintained.

## 2022-03-12 MED ORDER — HALOPERIDOL 5 MG PO TABS
10.0000 mg | ORAL_TABLET | Freq: Two times a day (BID) | ORAL | Status: DC
  Administered 2022-03-12 – 2022-03-17 (×10): 10 mg via ORAL
  Filled 2022-03-12 (×14): qty 2

## 2022-03-12 NOTE — Plan of Care (Signed)
  Problem: Activity: Goal: Sleeping patterns will improve Outcome: Progressing   Problem: Coping: Goal: Ability to verbalize frustrations and anger appropriately will improve Outcome: Progressing   Problem: Physical Regulation: Goal: Ability to maintain clinical measurements within normal limits will improve Outcome: Progressing   Problem: Safety: Goal: Periods of time without injury will increase Outcome: Progressing

## 2022-03-12 NOTE — Progress Notes (Signed)
The patient rated his day as an 8 out of a possible 10. Patient states that he is feeling better and has enjoyed the food in the cafeteria. He also states that he met some new peers today. His goal for tomorrow is to get discharged.

## 2022-03-12 NOTE — Plan of Care (Signed)
  Problem: Activity: Goal: Sleeping patterns will improve Outcome: Progressing   Problem: Coping: Goal: Ability to verbalize frustrations and anger appropriately will improve Outcome: Progressing   Problem: Safety: Goal: Periods of time without injury will increase Outcome: Progressing
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
## 2022-03-12 NOTE — Progress Notes (Signed)
   03/12/22 0842  Psych Admission Type (Psych Patients Only)  Admission Status Involuntary  Psychosocial Assessment  Patient Complaints Anxiety;Restlessness  Eye Contact Intense  Facial Expression Animated;Anxious  Affect Appropriate to circumstance  Speech Logical/coherent;Tangential  Interaction Assertive  Motor Activity Other (Comment) (WNL)  Appearance/Hygiene Unremarkable  Behavior Characteristics Fidgety;Hyperactive;Intrusive  Mood Suspicious;Silly  Thought Process  Coherency Tangential  Content Religiosity  Delusions Grandeur  Perception Derealization  Hallucination None reported or observed  Judgment Poor  Confusion Mild  Danger to Self  Current suicidal ideation? Denies  Danger to Others  Danger to Others None reported or observed

## 2022-03-12 NOTE — [facility] Suicide Risk Assessment (Signed)
[place] INPATIENT:  Family/Significant Other Suicide Prevention Education  Suicide Prevention Education:  Education Completed; Derrick Joseph) [phone],  (name of family member/significant other) has been identified by the patient as the family member/significant other with whom the patient will be residing, and identified as the person(s) who will aid the patient in the event of a mental health crisis (suicidal ideations/suicide attempt).  With written consent from the patient, the family member/significant other has been provided the following suicide prevention education, prior to the and/or following the discharge of the patient.  The suicide prevention education provided includes the following: Suicide risk factors Suicide prevention and interventions National Suicide Hotline telephone number [facility] assessment telephone number [facility] Emergency Assistance [place] and/or Residential Mobile Crisis Unit telephone number  Request made of family/significant other to: Remove weapons (e.g., guns, rifles, knives), all items previously/currently identified as safety concern.   Remove drugs/medications (over-the-counter, prescriptions, illicit drugs), all items previously/currently identified as a safety concern.  The family member/significant other verbalizes understanding of the suicide prevention education information provided.  The family member/significant other agrees to remove the items of safety concern listed above.  CSW spoke with patient mom and completed safety planning. Derrick Joseph, patient mom informed CSW that patient was admitted to [facility] not to long ago due to his erratic behavior, noncompliance with medication, and a decline in his overall Mental Health. Mom said that [facility] did not do their job and patient was even worse. 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 Mom continued on, and said that patient was causing conflict in the home with his brother, would leave to go stay with a friend and then him and the friend would get into and patient would get put out. Furthermore, mom mentioned that patient told her about a year ago , he tried to take a handful of pills to kill himself and ever since then she would monitor his medications and keep them so she could administer them. Mom is open to th LAI shot but states she will encourage patient since she is worried that he may try to abuse his pills if he feels like they are not working right away. Mom said that patient baseline was him being coherent, calm, manner able, respectful, and very friendly. Afterwards, mom confirmed that she did not have nor owned any guns or weapons in her home but was unsure if patient be around them since she does not know any of the friends he be with, and stated , " I do know that he smokes weed and drinks " .  Mom wants patient stable and back to his normal self of doing right , working, and taking care of his all needs like he been doing before. Patient will DC back home with mom and she will pick him up once patient is stabled and ready.   Sherre Lain 03/12/2022, 4:19 PM

## 2022-03-12 NOTE — Progress Notes (Signed)
   03/11/22 2000  Psych Admission Type (Psych Patients Only)  Admission Status Involuntary  Psychosocial Assessment  Patient Complaints Anxiety;Irritability  Eye Contact Intense  Facial Expression Anxious  Affect Appropriate to circumstance  Speech Logical/coherent  Interaction Assertive  Motor Activity Slow  Appearance/Hygiene Unremarkable  Behavior Characteristics Anxious;Intrusive  Mood Labile;Suspicious  Thought Process  Coherency Tangential  Content Religiosity  Delusions Grandeur  Perception Derealization  Hallucination None reported or observed  Judgment Poor  Confusion Mild  Danger to Self  Current suicidal ideation? Denies  Danger to Others  Danger to Others None reported or observed   Patient visible in [place] interacting well with Peers and Staff continues to be Tangential. Compliant with medications no adverse effects noted Support and encouragement provided.

## 2022-03-12 NOTE — [facility] Group Notes (Signed)
Child/Adolescent Psychoeducational Group Note  Date:  03/12/2022 Time:  1:16 PM  Group Topic/Focus:  Goals Group:   The focus of this group is to help patients establish daily goals to achieve during treatment and discuss how the patient can incorporate goal setting into their daily lives to aide in recovery. Orientation:   The focus of this group is to educate the patient on the purpose and policies of crisis stabilization and provide a format to answer questions about their admission.  The group details unit policies and expectations of patients while admitted.  Participation Level:  Active  Participation Quality:  Attentive  Affect:  Appropriate  Cognitive:  Appropriate  Insight:  Appropriate  Engagement in Group:  Engaged  Modes of Intervention:  Discussion  Additional Comments:  Patient attended group today and was attentive the duration of it. Patient's goal was to get a discharge plan.   Hattie Pine T Adriann Thau 03/12/2022, 1:16 PM

## 2022-03-12 NOTE — Group Note (Signed)
Recreation Therapy Group Note   Group Topic:Health and Wellness  Group Date: 03/12/2022 Start Time: 1000 End Time: 1030 Facilitators: Ximenna Fonseca-McCall, LRT,CTRS Location: [address] Dayroom   Goal Area(s) Addresses:  Patient will define components of whole wellness. Patient will verbalize benefit of whole wellness.  Group Description: Exercise.  LRT and patients talked about the importance of developing your physical health along with mental and spiritual health. LRT and patients were to complete at least 30 minutes of exercise.  LRT went to each person to pick different exercises to lead the group in.  Patients were encouraged to get water or take a break if needed.    Affect/Mood: Appropriate   Participation Level: Engaged   Participation Quality: Independent   Behavior: Appropriate   Speech/Thought Process: Focused   Insight: Good   Judgement: Good   Modes of Intervention: Music   Patient Response to Interventions:  Engaged   Education Outcome:  Acknowledges education and In group clarification offered    Clinical Observations/Individualized Feedback: Pt was bright and into group session.  Pt was appropriate and able to focus. Pt interacted well with peers. Pt led group in planks and push ups.  Pt expressed after group, his back was feeling better.      Plan: Continue to engage patient in RT group sessions 2-3x/week.   Ginny Loomer-McCall, LRT,CTRS 03/12/2022 12:22 PM

## 2022-03-12 NOTE — [facility] Suicide Risk Assessment (Signed)
[place] INPATIENT:  Family/Significant Other Suicide Prevention Education  Suicide Prevention Education:  Contact Attempts: [name] ( Mom) [phone], (name of family member/significant other) has been identified by the patient as the family member/significant other with whom the patient will be residing, and identified as the person(s) who will aid the patient in the event of a mental health crisis.  With written consent from the patient, two attempts were made to provide suicide prevention education, prior to and/or following the patient's discharge.  We were unsuccessful in providing suicide prevention education.  A suicide education pamphlet was given to the patient to share with family/significant other.  Date and time of first attempt:03/11/2022/ 3:00 PM Date and time of second attempt:03/12/2022/ 9:43 AM  Sherre Lain 03/12/2022, 9:42 AM
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
## 2022-03-13 NOTE — Group Note (Signed)
Recreation Therapy Group Note   Group Topic:Problem Solving  Group Date: 03/13/2022 Start Time: 1000 End Time: [id number] Facilitators: Kahli Fitzgerald-McCall, LRT,CTRS Location: [address] Dayroom   Goal Area(s) Addresses:  Patient will effectively work with peer towards shared goal.  Patient will identify skill used to make activity successful.  Patient will identify how skills used during activity can be used to reach post d/c goals.    Group Description: Patient(s) were given a set of solo cups, a rubber band, and some tied strings. The objective is to build a pyramid with the cups by only using the rubber band and string to move the cups. After the activity the patient(s) are LRT debriefed and discussed what strategies worked, what didn't, and what lessons they can take from the activity and use in life post discharge.   Affect/Mood: Appropriate   Participation Level: Engaged   Participation Quality: Independent   Behavior: Appropriate   Speech/Thought Process: Focused   Insight: Good   Judgement: Good   Modes of Intervention: STEM Activity   Patient Response to Interventions:  Engaged   Education Outcome:  Acknowledges education and In group clarification offered    Clinical Observations/Individualized Feedback: Pt was appropriate and on task throughout group session. Pt worked well with peers in coming up with ideas for the pyramid.  Pt offered ideas as well. Pt also showed some frustration during group.  Pt attempted to help finish the task but enged up watching from the sidelines.  Pt was bright throughout group session.    Plan: Continue to engage patient in RT group sessions 2-3x/week.   Derrick Joseph, LRT,CTRS  03/13/2022 11:27 AM

## 2022-03-13 NOTE — Plan of Care (Signed)
  Problem: Safety: Goal: Periods of time without injury will increase Outcome: Progressing   

## 2022-03-13 NOTE — Plan of Care (Signed)
  Problem: Education: Goal: Knowledge of [facility] Education information/materials will improve Outcome: Progressing Goal: Emotional status will improve Outcome: Progressing Goal: Mental status will improve Outcome: Progressing Goal: Verbalization of understanding the information provided will improve Outcome: Progressing   Problem: Activity: Goal: Interest or engagement in activities will improve Outcome: Progressing Goal: Sleeping patterns will improve Outcome: Progressing   

## 2022-03-13 NOTE — Group Note (Unsigned)
Date:  03/13/2022 Time:  9:03 AM  Group Topic/Focus:  Goals Group:   The focus of this group is to help patients establish daily goals to achieve during treatment and discuss how the patient can incorporate goal setting into their daily lives to aide in recovery.     Participation Level:  {BHH PARTICIPATION HD:996081  Participation Quality:  {BHH PARTICIPATION QUALITY:22265}  Affect:  {BHH AFFECT:22266}  Cognitive:  {BHH COGNITIVE:22267}  Insight: {BHH Insight2:20797}  Engagement in Group:  {BHH ENGAGEMENT IN GROUP:22268}  Modes of Intervention:  {BHH MODES OF INTERVENTION:22269}  Additional Comments:  ***  Derrick Joseph W Donnie Panik [id number], 9:03 AM

## 2022-03-13 NOTE — Progress Notes (Signed)
Adult Psychoeducational Group Note  Date:  03/13/2022 Time:  9:23 PM  Group Topic/Focus:  Wrap-Up Group:   The focus of this group is to help patients review their daily goal of treatment and discuss progress on daily workbooks.  Participation Level:  Did Not Attend  Participation Quality:   Did Not Attend  Affect:   Did Not Attend  Cognitive:   Did Not Attend  Insight: None  Engagement in Group:   Did Not Attend  Modes of Intervention:   Did Not Attend  Additional Comments:  Pt was encouraged to attend wrap up group but did not attend.  Candy Sledge 03/13/2022, 9:23 PM

## 2022-03-13 NOTE — B[facility] Group Notes (Addendum)
Spirituality group facilitated by Kathrynn Humble, [place].  Group Description: Group focused on topic of hope. Patients participated in facilitated discussion around topic, connecting with one another around experiences and definitions for hope. Group members engaged with visual explorer photos, reflecting on what hope looks like for them today. Group engaged in discussion around how their definitions of hope are present today in hospital.  Modalities: Psycho-social ed, Adlerian, Narrative, MI  Patient Progress: Derrick Joseph attended group and actively engaged and participated in group activities and conversation. He appeared somewhat restless and got up several times during the conversation.    [address], [place] Pager, [phone]

## 2022-03-13 NOTE — Progress Notes (Signed)
Patient compliant with medications mood and affect is Pleasant. No adverse effects noted. Patient requesting about discharge. Is interacting well with Public relations account executive. Q 15 minutes safety checks ongoing. Support and encouragement provided.

## 2022-03-13 NOTE — Progress Notes (Signed)
   03/13/22 2300  Psych Admission Type (Psych Patients Only)  Admission Status Involuntary  Psychosocial Assessment  Patient Complaints Anxiety  Eye Contact Intense  Facial Expression Anxious  Affect Appropriate to circumstance  Speech Logical/coherent  Interaction Assertive  Motor Activity Slow  Appearance/Hygiene Unremarkable  Behavior Characteristics Cooperative  Mood Anxious  Thought Process  Coherency Circumstantial  Content Other (Comment)  Delusions None reported or observed  Perception WDL  Hallucination None reported or observed  Judgment Impaired  Confusion Mild  Danger to Self  Current suicidal ideation? Denies  Danger to Others  Danger to Others None reported or observed   Alert/oriented. Makes needs/concerns known to staff. Pleasant cooperative with staff. Denies SI/HI/A/V hallucinations. Med compliant. Patient states went to group. Will encourage continue compliance and progression towards goals. Verbally contracted for safety. Will continue to monitor.

## 2022-03-13 NOTE — Group Note (Signed)
Occupational Therapy Group Note  Group Topic: Sleep Hygiene  Group Date: 03/13/2022 Start Time: 1400 End Time: 1430 Facilitators: Brantley Stage, OT   Group Description: Group encouraged increased participation and engagement through topic focused on sleep hygiene. Patients reflected on the quality of sleep they typically receive and identified areas that need improvement. Group was given background information on sleep and sleep hygiene, including common sleep disorders. Group members also received information on how to improve one's sleep and introduced a sleep diary as a tool that can be utilized to track sleep quality over a length of time. Group session ended with patients identifying one or more strategies they could utilize or implement into their sleep routine in order to improve overall sleep quality.        Therapeutic Goal(s):  Identify one or more strategies to improve overall sleep hygiene  Identify one or more areas of sleep that are negatively impacted (sleep too much, too little, etc)     Participation Level: Minimal   Participation Quality: Minimal Cues   Behavior: Distracted   Speech/Thought Process: Distracted and Loose association    Affect/Mood: Flat   Insight: Limited   Judgement: Limited   Individualization: pt was distracted and intermittently but passively engaged, restless in their participation of group discussion/activity. minimal identified  Modes of Intervention: Education  Patient Response to Interventions:  Disengaged   Plan: Continue to engage patient in OT groups 2 - 3x/week.  03/13/2022  Brantley Stage, OT Cornell Barman, OT

## 2022-03-13 NOTE — Progress Notes (Signed)
   03/13/22 0800  Psych Admission Type (Psych Patients Only)  Admission Status Involuntary  Psychosocial Assessment  Patient Complaints Anxiety  Eye Contact Intense  Facial Expression Anxious  Affect Appropriate to circumstance  Speech Logical/coherent  Interaction Assertive  Motor Activity Slow  Appearance/Hygiene Unremarkable  Behavior Characteristics Fidgety  Mood Silly  Thought Process  Coherency Tangential  Content Religiosity  Delusions Grandeur  Perception Derealization  Hallucination None reported or observed  Judgment Poor  Confusion Mild  Danger to Self  Current suicidal ideation? Denies  Danger to Others  Danger to Others None reported or observed

## 2022-03-14 NOTE — Plan of Care (Signed)
  Problem: Safety: Goal: Periods of time without injury will increase Outcome: Progressing   

## 2022-03-14 NOTE — Progress Notes (Addendum)
Pt is A&OX4, calm, less hypersexual, remains hyperverbal, remains grandiose, denies suicidal ideations, denies homicidal ideations, denies auditory hallucinations and denies visual hallucinations. Pt verbally agrees to approach staff if these become apparent and before harming self or others. Pt states he feels the medication is helpful. Pt denies experiencing nightmares. Mood and affect are congruent. Pt appetite is ok. No complaints of anxiety, distress, pain and/or discomfort at this time. Pt reports sleeping well. Pt's memory appears to be grossly intact, and Pt hasn't displayed any injurious behaviors. Pt is medication compliant. There's no evidence of suicidal intent. Psychomotor activity was WNL. No s/s of Parkinson, Dystonia, Akathisia and/or Tardive Dyskinesia noted.

## 2022-03-14 NOTE — Progress Notes (Signed)
   03/13/22 2300  Charting Type  Charting Type Shift assessment  Safety Check Verification  Has the RN verified the 15 minute safety check completion? Yes  Neurological  Neuro (WDL) X  HEENT  HEENT (WDL) WDL  Respiratory  Respiratory (WDL) WDL  Cardiac  Cardiac (WDL) WDL  Vascular  Vascular (WDL) WDL  Integumentary  Integumentary (WDL) WDL  Braden Scale (Ages 8 and up)  Sensory Perceptions 4  Moisture 4  Activity 4  Mobility 4  Nutrition 3  Friction and Shear 3  Braden Scale Score 22  Musculoskeletal  Musculoskeletal (WDL) WDL  Gastrointestinal  Gastrointestinal (WDL) WDL  GU Assessment  Genitourinary (WDL) WDL  Neurological  Level of Consciousness Alert   Alert/oriented. Makes needs/concerns known to staff. Pleasant cooperative with staff. Denies SI/HI/A/V hallucinations. Med compliant. Patient went to group. States goal for today was to listen more and talk less. Will encourage continue compliance and progression towards goals. Verbally contracted for safety. Will continue to monitor.

## 2022-03-14 NOTE — Group Note (Signed)
Date:  03/14/2022 Time:  9:19 AM  Group Topic/Focus:  Goals Group:   The focus of this group is to help patients establish daily goals to achieve during treatment and discuss how the patient can incorporate goal setting into their daily lives to aide in recovery. Orientation:   The focus of this group is to educate the patient on the purpose and policies of crisis stabilization and provide a format to answer questions about their admission.  The group details unit policies and expectations of patients while admitted.    Participation Level:  Active  Participation Quality:  Intrusive  Affect:  Appropriate  Cognitive:  Appropriate  Insight: Appropriate  Engagement in Group:  Engaged  Modes of Intervention:  Discussion and Exploration  Additional Comments:    Dub Mikes 03/14/2022, 9:19 AM

## 2022-03-14 NOTE — Group Note (Signed)
Date:  03/14/2022 Time:  4:53 PM  Group Topic/Focus:  Healthy Communication:   The focus of this group is to discuss communication, barriers to communication, as well as healthy ways to communicate with others.    Participation Level:  Active  Participation Quality:  Appropriate  Affect:  Appropriate  Cognitive:  Appropriate  Insight: Appropriate  Engagement in Group:  Engaged  Modes of Intervention:  Discussion  Additional Comments:    Dub Mikes 03/14/2022, 4:53 PM
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
## 2022-03-14 NOTE — Group Note (Signed)
Date:  03/14/2022 Time:  11:08 PM  Group Topic/Focus:  Wrap-Up Group:   The focus of this group is to help patients review their daily goal of treatment and discuss progress on daily workbooks.    Participation Level:  Did Not Attend  Debe Coder 03/14/2022, 11:08 PM

## 2022-03-15 LAB — COMPREHENSIVE METABOLIC PANEL
ALT: 38 U/L (ref 0–44)
AST: 33 U/L (ref 15–41)
Albumin: 4.2 g/dL (ref 3.5–5.0)
Alkaline Phosphatase: 69 U/L (ref 38–126)
Anion gap: 10 (ref 5–15)
BUN: 11 mg/dL (ref 6–20)
CO2: 28 mmol/L (ref 22–32)
Calcium: 8.7 mg/dL — ABNORMAL LOW (ref 8.9–10.3)
Chloride: 98 mmol/L (ref 98–111)
Creatinine, Ser: 1.12 mg/dL (ref 0.61–1.24)
GFR, Estimated: >60 mL/min (ref >60)
Glucose, Bld: 86 mg/dL (ref 70–99)
Potassium: 4.2 mmol/L (ref 3.5–5.1)
Sodium: 136 mmol/L (ref 135–145)
Total Bilirubin: 0.4 mg/dL (ref 0.3–1.2)
Total Protein: 7.6 g/dL (ref 6.5–8.1)

## 2022-03-15 LAB — AMMONIA: Ammonia: 30 umol/L (ref 9–35)

## 2022-03-15 LAB — VALPROIC ACID LEVEL: Valproic Acid Lvl: 97 ug/mL (ref 50.0–100.0)

## 2022-03-15 NOTE — Progress Notes (Signed)
Adult Psychoeducational Group Note  Date:  03/15/2022 Time:  11:32 AM  Group Topic/Focus: Music group   Participation Level:  Active  Participation Quality:  Appropriate  Affect:  Appropriate  Cognitive:  Appropriate  Insight: Appropriate  Engagement in Group:  Engaged  Modes of Intervention:  Discussion  Additional Comments:  Pt attended the music group and remained appropriate and engaged throughout the duration of the group.   Beryle Beams 03/15/2022, 11:32 AM

## 2022-03-15 NOTE — Progress Notes (Signed)
D: Patient is alert, pleasant, grandiose, and cooperative. Denies SI, HI, AVH, and verbally contracts for safety. Patient reports he slept good last night with sleeping medication. Patient reports his appetite as good, energy level as normal, and concentration as good. Patient rates his depression 0/10, hopelessness 0/10, and anxiety 0/10. Patient reports anxiety and headache. Patient states his goal is "speak less and listen more".   A: Scheduled medications administered per MD order. PRN vistaril and tylenol administered. Support provided. Patient educated on safety on the unit and medications. Routine safety checks every 15 minutes. Patient stated understanding to tell nurse about any new physical symptoms. Patient understands to tell staff of any needs.     R: No adverse drug reactions noted. Patient verbally contracts for safety. Patient remains safe at this time and will continue to monitor.    03/15/22 0900  Psych Admission Type (Psych Patients Only)  Admission Status Involuntary  Psychosocial Assessment  Patient Complaints Anxiety  Eye Contact Intense  Facial Expression Anxious  Affect Appropriate to circumstance  Speech Logical/coherent  Interaction Assertive  Motor Activity Slow  Appearance/Hygiene Unremarkable  Behavior Characteristics Cooperative  Mood Anxious;Silly  Thought Process  Coherency Circumstantial  Content Magical thinking  Delusions Grandeur  Perception WDL  Hallucination None reported or observed  Judgment Limited  Confusion Mild  Danger to Self  Current suicidal ideation? Denies  Danger to Others  Danger to Others None reported or observed

## 2022-03-15 NOTE — Progress Notes (Signed)
Adult Psychoeducational Group Note  Date:  03/15/2022 Time:  8:36 PM  Group Topic/Focus:  Wrap-Up Group:   The focus of this group is to help patients review their daily goal of treatment and discuss progress on daily workbooks.  Participation Level:  Active  Participation Quality:  Appropriate  Affect:  Appropriate  Cognitive:  Appropriate  Insight: Appropriate  Engagement in Group:  Engaged  Modes of Intervention:  Discussion and Support  Additional Comments:  Pt attended the evening group and responded to all discussion prompts from the [place]. Pt shared that today was a good day on the unit, the highlight of which was learning of his upcoming discharge. "I also had some good talks with the staff." Pt told that this was his only goal at the moment - discharging from the hospital. Pt rated his day a 7 out of 10.  Wende Crease 03/15/2022, 8:36 PM

## 2022-03-15 NOTE — Progress Notes (Signed)
Adult Psychoeducational Group Note  Date:  03/15/2022 Time:  11:31 AM  Group Topic/Focus:  Goals Group:   The focus of this group is to help patients establish daily goals to achieve during treatment and discuss how the patient can incorporate goal setting into their daily lives to aide in recovery. Orientation:   The focus of this group is to educate the patient on the purpose and policies of crisis stabilization and provide a format to answer questions about their admission.  The group details unit policies and expectations of patients while admitted.  Participation Level:  Active  Participation Quality:  Appropriate  Affect:  Appropriate  Cognitive:  Appropriate  Insight: Appropriate  Engagement in Group:  Engaged  Modes of Intervention:  Discussion  Additional Comments:  Pt attended the goals group and remained appropriate and engaged throughout the duration of the group.   Beryle Beams 03/15/2022, 11:31 AM
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
## 2022-03-15 NOTE — Progress Notes (Signed)
   03/15/22 2254  Psych Admission Type (Psych Patients Only)  Admission Status Involuntary  Psychosocial Assessment  Patient Complaints Anxiety  Eye Contact Intense  Facial Expression Animated  Affect Appropriate to circumstance  Speech Logical/coherent  Interaction Assertive  Motor Activity Other (Comment) (WDL)  Appearance/Hygiene Unremarkable  Behavior Characteristics Appropriate to situation  Mood Silly;Pleasant  Thought Process  Coherency Concrete thinking  Content Magical thinking  Delusions Grandeur  Perception WDL  Hallucination None reported or observed  Judgment Impaired  Confusion None  Danger to Self  Current suicidal ideation? Denies  Agreement Not to Harm Self Yes  Description of Agreement verbal  Danger to Others  Danger to Others None reported or observed

## 2022-03-16 ENCOUNTER — Encounter (HOSPITAL_COMMUNITY): Payer: Self-pay

## 2022-03-16 MED ORDER — WHITE PETROLATUM EX OINT
TOPICAL_OINTMENT | CUTANEOUS | Status: AC
  Filled 2022-03-16: qty 5

## 2022-03-16 NOTE — Progress Notes (Signed)
   03/16/22 1950  Psych Admission Type (Psych Patients Only)  Admission Status Involuntary  Psychosocial Assessment  Patient Complaints Other (Comment) Merrie Roof')  Eye Contact Fair  Facial Expression Animated  Affect Appropriate to circumstance  Speech Logical/coherent  Interaction Assertive  Motor Activity Other (Comment) (WDL)  Appearance/Hygiene Unremarkable  Behavior Characteristics Appropriate to situation;Calm;Cooperative  Mood Pleasant  Thought Pension scheme manager thinking  Content Magical thinking  Delusions Grandeur  Perception WDL  Hallucination None reported or observed  Judgment Impaired  Confusion None  Danger to Self  Current suicidal ideation? Denies  Agreement Not to Harm Self Yes  Description of Agreement Verbal  Danger to Others  Danger to Others None reported or observed
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
## 2022-03-16 NOTE — Group Note (Signed)
Recreation Therapy Group Note   Group Topic:Communication  Group Date: 03/16/2022 Start Time: [id number] End Time: [id number] Facilitators: Ora Bollig-McCall, LRT,CTRS Location: [address] Dayroom   Goal Area(s) Addresses:  Patient will effectively listen to complete activity.  Patient will identify communication skills used to make activity successful.  Patient will identify how skills used during activity can be used to reach post d/c goals.    Group Description: Geometric Drawings.  Three volunteers from the peer group will be shown an abstract picture with a particular arrangement of geometrical shapes.  Each round, one 'speaker' will describe the pattern, as accurately as possible without revealing the image to the group.  The remaining group members will listen and draw the picture to reflect how it is described to them. Patients with the role of 'listener' cannot ask clarifying questions but, may request that the speaker repeat a direction. Once the drawings are complete, the presenter will show the rest of the group the picture and compare how close each person came to drawing the picture. LRT will facilitate a post-activity discussion regarding effective communication and the importance of planning, listening, and asking for clarification in daily interactions with others.   Affect/Mood: Appropriate   Participation Level: Engaged   Participation Quality: Independent   Behavior: Appropriate   Speech/Thought Process: Focused   Insight: Good   Judgement: Good   Modes of Intervention: Activity   Patient Response to Interventions:  Engaged   Education Outcome:  Acknowledges education and In group clarification offered    Clinical Observations/Individualized Feedback: Pt was appropriate and engaged during group session.  Pt identified eye contact as a form of communication. Pt presented the first picture.  Pt rated himself a 7 out of 10 in how he did the presentation.  Pt also felt  the other two presenters did a good job as well in presenting their pictures.     Plan: Continue to engage patient in RT group sessions 2-3x/week.   Akane Tessier-McCall, LRT,CTRS 03/16/2022 1:07 PM

## 2022-03-16 NOTE — Progress Notes (Signed)
Adult Psychoeducational Group Note  Date:  03/16/2022 Time:  8:35 PM  Group Topic/Focus:  Wrap-Up Group:   The focus of this group is to help patients review their daily goal of treatment and discuss progress on daily workbooks.  Participation Level:  None  Participation Quality:  Drowsy  Affect:   Asleep  Cognitive:   Asleep  Insight: None  Engagement in Group:  None  Modes of Intervention:   None  Additional Comments:  Pt attended the evening group, but was asleep throughout. While the Writer attempted to wake the Pt, he fell asleep again immediately after.   Derrick Joseph 03/16/2022, 8:35 PM

## 2022-03-16 NOTE — BH IP Treatment Plan (Signed)
Interdisciplinary Treatment and Diagnostic Plan Update  03/16/2022 Time of Session: 9:50am  Derrick Joseph MRN: [id number]  Principal Diagnosis: Bipolar I disorder, most recent episode (or current) manic ([place])  Secondary Diagnoses: Principal Problem:   Bipolar I disorder, most recent episode (or current) manic ([place]) Active Problems:   Bipolar 1 disorder ([place])   Current Medications:  Current Facility-Administered Medications  Medication Dose Route Frequency Provider Last Rate Last Admin   acetaminophen (TYLENOL) tablet 650 mg  650 mg Oral Q6H PRN Revonda Humphrey, NP   650 mg at 03/15/22 0807   alum & mag hydroxide-simeth (MAALOX/MYLANTA) 200-200-20 MG/5ML suspension 30 mL  30 mL Oral Q4H PRN Revonda Humphrey, NP       benztropine (COGENTIN) tablet 1 mg  1 mg Oral BID Carrion-Carrero, Margely, MD   1 mg at 03/16/22 0745   diphenhydrAMINE (BENADRYL) capsule 50 mg  50 mg Oral TID PRN Revonda Humphrey, NP   50 mg at 03/13/22 2059   Or   diphenhydrAMINE (BENADRYL) injection 50 mg  50 mg Intramuscular TID PRN Revonda Humphrey, NP       divalproex (DEPAKOTE) DR tablet 500 mg  500 mg Oral Q12H Carrion-Carrero, Margely, MD   500 mg at 03/16/22 0745   haloperidol (HALDOL) tablet 10 mg  10 mg Oral BID Carrion-Carrero, Caryl Ada, MD   10 mg at 03/16/22 0745   haloperidol (HALDOL) tablet 5 mg  5 mg Oral TID PRN Revonda Humphrey, NP   5 mg at 03/09/22 1356   Or   haloperidol lactate (HALDOL) injection 5 mg  5 mg Intramuscular TID PRN Revonda Humphrey, NP       hydrOXYzine (ATARAX) tablet 25 mg  25 mg Oral TID PRN Revonda Humphrey, NP   25 mg at 03/15/22 2043   LORazepam (ATIVAN) tablet 2 mg  2 mg Oral TID PRN Revonda Humphrey, NP   2 mg at 03/11/22 [id number]   Or   LORazepam (ATIVAN) injection 2 mg  2 mg Intramuscular TID PRN Revonda Humphrey, NP       magnesium hydroxide (MILK OF MAGNESIA) suspension 30 mL  30 mL Oral Daily PRN Revonda Humphrey, NP       nicotine polacrilex (NICORETTE)  gum 2 mg  2 mg Oral PRN Massengill, Ovid Curd, MD   2 mg at 03/14/22 1823   traZODone (DESYREL) tablet 100 mg  100 mg Oral QHS PRN Christene Slates, MD   100 mg at 03/15/22 2043   PTA Medications: Medications Prior to Admission  Medication Sig Dispense Refill Last Dose   OLANZapine zydis (ZYPREXA) 10 MG disintegrating tablet Take 1 tablet (10 mg total) by mouth at bedtime.      OLANZapine zydis (ZYPREXA) 5 MG disintegrating tablet Take 1 tablet (5 mg total) by mouth daily.       Patient Stressors:    Patient Strengths:    Treatment Modalities: Medication Management, Group therapy, Case management,  1 to 1 session with clinician, Psychoeducation, Recreational therapy.   Physician Treatment Plan for Primary Diagnosis: Bipolar I disorder, most recent episode (or current) manic ([place]) Long Term Goal(s): Improvement in symptoms so as ready for discharge   Short Term Goals:   Medication Management: Evaluate patient's response, side effects, and tolerance of medication regimen.  Therapeutic Interventions: 1 to 1 sessions, Unit Group sessions and Medication administration.  Evaluation of Outcomes: Progressing  Physician Treatment Plan for Secondary Diagnosis: Principal Problem:   Bipolar I disorder, most  recent episode (or current) manic ([place]) Active Problems:   Bipolar 1 disorder ([place])  Long Term Goal(s): Improvement in symptoms so as ready for discharge   Short Term Goals:      Medication Management: Evaluate patient's response, side effects, and tolerance of medication regimen.  Therapeutic Interventions: 1 to 1 sessions, Unit Group sessions and Medication administration.  Evaluation of Outcomes: Progressing   RN Treatment Plan for Primary Diagnosis: Bipolar I disorder, most recent episode (or current) manic ([place]) Long Term Goal(s): Knowledge of disease and therapeutic regimen to maintain health will improve  Short Term Goals: Ability to remain free from injury will  improve, Ability to participate in decision making will improve, Ability to verbalize feelings will improve, Ability to disclose and discuss suicidal ideas, and Ability to identify and develop effective coping behaviors will improve  Medication Management: RN will administer medications as ordered by provider, will assess and evaluate patient's response and provide education to patient for prescribed medication. RN will report any adverse and/or side effects to prescribing provider.  Therapeutic Interventions: 1 on 1 counseling sessions, Psychoeducation, Medication administration, Evaluate responses to treatment, Monitor vital signs and CBGs as ordered, Perform/monitor CIWA, COWS, AIMS and Fall Risk screenings as ordered, Perform wound care treatments as ordered.  Evaluation of Outcomes: Progressing   LCSW Treatment Plan for Primary Diagnosis: Bipolar I disorder, most recent episode (or current) manic ([place]) Long Term Goal(s): Safe transition to appropriate next level of care at discharge, Engage patient in therapeutic group addressing interpersonal concerns.  Short Term Goals: Engage patient in aftercare planning with referrals and resources, Increase social support, Increase emotional regulation, Facilitate acceptance of mental health diagnosis and concerns, Identify triggers associated with mental health/substance abuse issues, and Increase skills for wellness and recovery  Therapeutic Interventions: Assess for all discharge needs, 1 to 1 time with Social worker, Explore available resources and support systems, Assess for adequacy in community support network, Educate family and significant other(s) on suicide prevention, Complete Psychosocial Assessment, Interpersonal group therapy.  Evaluation of Outcomes: Progressing   Progress in Treatment: Attending groups: Yes. Participating in groups: Yes. Taking medication as prescribed: Yes. Toleration medication: Yes. Family/Significant other  contact made: Yes, individual(s) contacted:  Mother  Patient understands diagnosis: No. Discussing patient identified problems/goals with staff: Yes. Medical problems stabilized or resolved: Yes. Denies suicidal/homicidal ideation: Yes. Issues/concerns per patient self-inventory: No.     New problem(s) identified: No, Describe:  None    New Short Term/Long Term Goal(s): medication stabilization, elimination of SI thoughts, development of comprehensive mental wellness plan.    Patient Goals: "To go home"   Discharge Plan or Barriers: Patient recently admitted. CSW will continue to follow and assess for appropriate referrals and possible discharge planning.    Reason for Continuation of Hospitalization: Anxiety Mania Medication stabilization   Estimated Length of Stay: 3 to 7 days   Last 3 [place] Suicide Severity Risk Score: Flowsheet Row Admission (Current) from 03/09/2022 in [facility] ED from [facility]/25/2022 in [facility] Department [facility] ED from 07/04/2021 in [facility] No Risk High Risk No Risk       Last PHQ 2/9 Scores:     No data to display          Scribe for Treatment Team: Darleen Crocker, Latanya Presser 03/16/2022 9:54 AM

## 2022-03-16 NOTE — Plan of Care (Signed)
  Problem: Education: Goal: Emotional status will improve Outcome: Progressing Goal: Mental status will improve Outcome: Progressing   Problem: Coping: Goal: Ability to demonstrate self-control will improve Outcome: Progressing   Problem: Health Behavior/Discharge Planning: Goal: Compliance with treatment plan for underlying cause of condition will improve Outcome: Progressing   

## 2022-03-16 NOTE — Progress Notes (Signed)
D: Patient is alert, grandiose, pleasant, and cooperative. Denies SI, HI, AVH, and verbally contracts for safety.    A: Scheduled medications administered per MD order. PRN nicotine gum administered. Support provided. Patient educated on safety on the unit and medications. Routine safety checks every 15 minutes. Patient stated understanding to tell nurse about any new physical symptoms. Patient understands to tell staff of any needs.     R: No adverse drug reactions noted. Patient verbally contracts for safety. Patient remains safe at this time and will continue to monitor.    03/16/22 0900  Psych Admission Type (Psych Patients Only)  Admission Status Involuntary  Psychosocial Assessment  Patient Complaints Anxiety  Eye Contact Intense  Facial Expression Animated  Affect Appropriate to circumstance  Speech Logical/coherent  Interaction Assertive  Motor Activity Other (Comment) (WDL)  Appearance/Hygiene Unremarkable  Behavior Characteristics Appropriate to situation  Mood Silly;Pleasant  Thought Process  Coherency Concrete thinking  Content Magical thinking  Delusions Grandeur  Perception WDL  Hallucination None reported or observed  Judgment Impaired  Confusion None  Danger to Self  Current suicidal ideation? Denies  Agreement Not to Harm Self Yes  Description of Agreement verbal  Danger to Others  Danger to Others None reported or observed

## 2022-03-16 NOTE — Group Note (Signed)
LCSW Group Therapy Note   Group Date: 03/16/2022 Start Time: 1300 End Time: 1400   LCSW Group Therapy    Type of Therapy and Topic:  Group Therapy:  Setting Goals   Participation Level:  Active   Description of Group: In this process group, patients discussed using strengths to work toward goals and address challenges.  Patients identified two positive things about themselves and one goal they were working on.  Patients were given the opportunity to share openly and support each other's plan for self-empowerment.  The group discussed the value of gratitude and were encouraged to have a daily reflection of positive characteristics or circumstances.  Patients were encouraged to identify a plan to utilize their strengths to work on current challenges and goals.   Therapeutic Goals Patient will verbalize personal strengths/positive qualities and relate how these can assist with achieving desired personal goals Patients will verbalize affirmation of peers plans for personal change and goal setting Patients will explore the value of gratitude and positive focus as related to successful achievement of goals Patients will verbalize a plan for regular reinforcement of personal positive qualities and circumstances.   Summary of Patient Progress:  Patient identified the definition of goals. Patients was very insightful, shared openly and provided support to his peers who dicussed their plans with setting goals for family, friends, work/school, spirituality, body, and mental health. Patient verbalized his personal strength and how it could relate to his achievement towards his desired goals. Patient shared that his goal was to learn how to drive a [id number] and become a businessman. 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 Patient was respectful and remain in group the entire time.      Therapeutic Modalities Cognitive Behavioral  Therapy Motivational Interviewing      Charlett Lango 03/16/2022  3:32 PM

## 2022-03-17 MED ORDER — BENZTROPINE MESYLATE 1 MG PO TABS: 1.0000 mg | ORAL_TABLET | Freq: Two times a day (BID) | ORAL | 0 refills | Status: DC

## 2022-03-17 MED ORDER — HALOPERIDOL 10 MG PO TABS: 10.0000 mg | ORAL_TABLET | Freq: Two times a day (BID) | ORAL | 0 refills | Status: DC

## 2022-03-17 MED ORDER — NICOTINE POLACRILEX 2 MG MT GUM: 2.0000 mg | CHEWING_GUM | OROMUCOSAL | 0 refills | Status: DC | PRN

## 2022-03-17 MED ORDER — DIVALPROEX SODIUM 500 MG PO DR TAB: 500.0000 mg | DELAYED_RELEASE_TABLET | Freq: Two times a day (BID) | ORAL | 0 refills | Status: DC

## 2022-03-17 MED ORDER — TRAZODONE HCL 100 MG PO TABS: 100.0000 mg | ORAL_TABLET | Freq: Every evening | ORAL | 0 refills | Status: DC | PRN

## 2022-03-17 NOTE — Progress Notes (Signed)
Adult Psychoeducational Group Note  Date:  03/17/2022 Time:  9:21 AM  Group Topic/Focus:  Goals Group:   The focus of this group is to help patients establish daily goals to achieve during treatment and discuss how the patient can incorporate goal setting into their daily lives to aide in recovery.  Participation Level:  Active  Participation Quality:  Appropriate  Affect:  Appropriate  Cognitive:  Appropriate  Insight: Appropriate  Engagement in Group:  Engaged  Modes of Intervention:  Discussion  Additional Comments: The patient attended group  Nash Shearer 03/17/2022, 9:21 AM

## 2022-03-17 NOTE — Progress Notes (Signed)
   03/17/22 0530  15 Minute Checks  Location Bedroom  Visual Appearance Calm  Behavior Sleeping  Sleep (Behavioral Health Patients Only)  Calculate sleep? (Click Yes once per 24 hr at 0600 safety check) Yes  Documented sleep last 24 hours 6

## 2022-03-17 NOTE — Progress Notes (Signed)
D: Pt A & O X 3. Denies SI, HI, AVH and pain at this time. D/C home as ordered. Picked up in lobby by his mother.  A: D/C instructions reviewed with pt including prescriptions, medication samples and follow up appointment, compliance encouraged. Pt had no belongings in locker at time of d/c. Per mom, he's missing a rust color suede [employer] boots. Scheduled and PRN medications given with verbal education and effects monitored. Safety checks maintained without incident till time of d/c.  R: Pt receptive to care. Compliant with medications when offered. Denies adverse drug reactions when assessed. Verbalized understanding related to d/c instructions. Signed belonging sheet in agreement that he receive no items in locker at time of d/c. Ambulatory with a steady gait. Appears to be in no physical distress at time of departure.

## 2022-03-17 NOTE — Progress Notes (Signed)
  [facility] Adult Case Management Discharge Plan :  Will you be returning to the same living situation after discharge:  Yes,  Patient will be returning back with his mom At discharge, do you have transportation home?: Yes,  Patient mom will be picking him up between 11:00 AM - 12:00 PM Do you have the ability to pay for your medications: No. [employer] and Pharmacy Saving Cards provided   Release of information consent forms completed and in the chart;  Patient's signature needed at discharge.  Patient to Follow up at:  [facility]. Go to.   Specialty: Behavioral Health Why: You may go to this provider for an assessment, to obtain therapy and medication management services on Monday through Friday, arrive at 7:20 am. Assessments are provided on a first come, first served basis. Contact information: [place] [facility] Follow up on 03/23/2022.   Why: You have an appointment for therapy services on  03/23/22 at 2:00 pm.  This will be a Virtual appointment via [place].  * PLEASE CALL TO CONFIRM THE APPT. AND YOUR EMAIL ADDRESS. Contact information: [address] [place],  [address]  Phone: [phone]                Next level of care provider has access to [place] and Suicide Prevention discussed: Velta Addison,  [name] ( Mom) [phone]      Has patient been referred to the Quitline?: Yes, faxed on 03/17/2022  Patient has been referred for addiction treatment: N/A. Patient smokes marijuana but does not use any other substances. 

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
## 2022-03-17 NOTE — Group Note (Signed)
Recreation Therapy Group Note   Group Topic:Team Building  Group Date: 03/17/2022 Start Time: [id number] End Time: 1105 Facilitators: Kiefer Opheim-McCall, LRT,CTRS Location: [address] Dayroom   Goal Area(s) Addresses:  Patient will effectively work with peer towards shared goal.  Patient will identify skills used to make activity successful.  Patient will identify how skills used during activity can be applied to reach post d/c goals.   Group Description: [employer]. In teams of 5-6, patients were given 11 craft pipe cleaners. Using the materials provided, patients were instructed to compete again the opposing team(s) to build the tallest free-standing structure from floor level. The activity was timed; difficulty increased by Probation officer as Pharmacist, hospital continued.  Systematically resources were removed with additional directions for example, placing one arm behind their back, working in silence, and shape stipulations. LRT facilitated post-activity discussion reviewing team processes and necessary communication skills involved in completion. Patients were encouraged to reflect how the skills utilized, or not utilized, in this activity can be incorporated to positively impact support systems post discharge.   Affect/Mood: Appropriate   Participation Level: Engaged   Participation Quality: Independent   Behavior: Appropriate   Speech/Thought Process: Focused   Insight: Good   Judgement: Good   Modes of Intervention: STEM Activity   Patient Response to Interventions:  Engaged   Education Outcome:  Acknowledges education and In group clarification offered    Clinical Observations/Individualized Feedback: Pt was appropriate and engaged.  Pt asked questions when needed.  Pt was also attentive to the ideas of his peers and working through them.  During discussion, pt expressed the group used communication to complete project.  Pt also expressed, with support system they  would have to come up with a plan and work through it.    Plan: Continue to engage patient in RT group sessions 2-3x/week.   Corneisha Alvi-McCall, LRT,CTRS  03/17/2022 11:21 AM

## 2022-03-30 ENCOUNTER — Ambulatory Visit (INDEPENDENT_AMBULATORY_CARE_PROVIDER_SITE_OTHER)
Payer: No Typology Code available for payment source | Admitting: Student in an Organized Health Care Education/Training Program

## 2022-03-30 ENCOUNTER — Encounter (HOSPITAL_COMMUNITY): Payer: Self-pay | Admitting: Student in an Organized Health Care Education/Training Program

## 2022-03-30 VITALS — BP 131/92 | HR 64 | Ht 67.0 in | Wt 138.0 lb

## 2022-03-30 DIAGNOSIS — G4700 Insomnia, unspecified: Secondary | ICD-10-CM | POA: Diagnosis not present

## 2022-03-30 DIAGNOSIS — F3110 Bipolar disorder, current episode manic without psychotic features, unspecified: Secondary | ICD-10-CM

## 2022-03-30 DIAGNOSIS — F1220 Cannabis dependence, uncomplicated: Secondary | ICD-10-CM | POA: Diagnosis not present

## 2022-03-30 MED ORDER — HALOPERIDOL 10 MG PO TABS: 10.0000 mg | ORAL_TABLET | Freq: Two times a day (BID) | ORAL | 0 refills | Status: DC

## 2022-03-30 MED ORDER — DIVALPROEX SODIUM 500 MG PO DR TAB: 500.0000 mg | DELAYED_RELEASE_TABLET | Freq: Two times a day (BID) | ORAL | 1 refills | Status: DC

## 2022-03-30 MED ORDER — BENZTROPINE MESYLATE 1 MG PO TABS: 1.0000 mg | ORAL_TABLET | Freq: Two times a day (BID) | ORAL | 1 refills | Status: DC

## 2022-03-30 MED ORDER — DIVALPROEX SODIUM 500 MG PO DR TAB: 500.0000 mg | DELAYED_RELEASE_TABLET | Freq: Two times a day (BID) | ORAL | 0 refills | Status: DC

## 2022-03-30 MED ORDER — HALOPERIDOL 10 MG PO TABS: 10.0000 mg | ORAL_TABLET | Freq: Two times a day (BID) | ORAL | 1 refills | Status: DC

## 2022-03-30 MED ORDER — TRAZODONE HCL 100 MG PO TABS: 100.0000 mg | ORAL_TABLET | Freq: Every evening | ORAL | 1 refills | Status: DC | PRN

## 2022-03-30 MED ORDER — TRAZODONE HCL 100 MG PO TABS: 100.0000 mg | ORAL_TABLET | Freq: Every evening | ORAL | 0 refills | Status: DC | PRN

## 2022-03-30 MED ORDER — BENZTROPINE MESYLATE 1 MG PO TABS: 1.0000 mg | ORAL_TABLET | Freq: Two times a day (BID) | ORAL | 0 refills | Status: DC
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
## 2022-04-26 ENCOUNTER — Ambulatory Visit (HOSPITAL_COMMUNITY)
Admission: EM | Admit: 2022-04-26 | Discharge: 2022-04-26 | Disposition: A | Discharge status: Home / Self Care | Payer: Medicaid Other | Attending: Psychiatry | Admitting: Psychiatry

## 2022-04-26 DIAGNOSIS — F319 Bipolar disorder, unspecified: Secondary | ICD-10-CM | POA: Insufficient documentation

## 2022-04-26 DIAGNOSIS — Z5189 Encounter for other specified aftercare: Secondary | ICD-10-CM

## 2022-04-26 NOTE — Progress Notes (Signed)
   04/26/22 1142  [facility] Triage Screening (Walk-ins at [facility] only)  How Did You Hear About [place]? Family/Friend  What Is the Reason for Your Visit/Call Today? Pt presents to [facility] requesting to his medications changed from pill form to shot form. Pt denies SI, HI, AVH. Reports he is doing well and just want to make sure he stays on his medications. Per mom pt is having some issues with managing his medications and he requested to get put on shot form of medications. Mom denies safety concerns and will bring patient back for open access.  How Long Has This Been Causing You Problems? 1 wk - 1 month  Have You Recently Had Any Thoughts About Hurting Yourself? No  Are You Planning to Commit Suicide/Harm Yourself At This time? No  Have you Recently Had Thoughts About Hurting Someone Karolee Ohs? No  Are You Planning To Harm Someone At This Time? No  Are you currently experiencing any auditory, visual or other hallucinations? No  Have You Used Any Alcohol or Drugs in the Past 24 Hours? No  What Did You Use and How Much? THC yesterda  Do you have any current medical co-morbidities that require immediate attention? No  Clinician description of patient physical appearance/behavior: calm, engaging, smiling  What Do You Feel Would Help You the Most Today? Treatment for Depression or other mood problem;Medication(s)  If access to [facility] was not available, would you have sought care in the Emergency Department? No  Determination of Need Routine (7 days)  Options For Referral Medication Management;Outpatient Therapy

## 2022-04-26 NOTE — ED Provider Notes (Signed)
Behavioral Health Urgent Care Medical Screening Exam  Patient Name: Mccabe Gloria MRN: [id number] Date of Evaluation: 04/26/22  Chief Complaint:  " I want to get the shot." Diagnosis:  Final diagnoses:  Encounter for medication adjustment    History of Present illness: Laban Orourke is a 22 y.o. male.  Presents to [facility] urgent care accompanied by his mother.  Patient is requesting to have his medications changed/adjusted in order to get the monthly injection.  He reports he is currently prescribed Depakote, Haldol, Cogentin and trazodone.  He reports he has been taking and tolerating medications well.  States " I want to stay on my medications and get my mental health right."  Does report smoking marijuana however denies any other illicit drug use at this time.  Amier has a charted history with Bipolar 1 disorder and cannabis use.  Chart review patient had 3 prior psychiatric admissions.  He is adamantly denying suicidal or homicidal ideations.  Denies auditory or visual hallucinations.   Jebediah Macrae is sitting in no acute distress.  He is alert/oriented x 4; calm/cooperative; slight pressured and mood congruent with affect. he is speaking in a clear tone at moderate volume, and normal pace; with good eye contact. His thought process is coherent and relevant; There is no indication that he is currently responding to internal/external stimuli or experiencing delusional thought content; and he has denied suicidal/self-harm/homicidal ideation, psychosis, and paranoia.   Patient has remained calm throughout assessment and has answered questions appropriately.     At this time Kassem Kibbe is educated and verbalizes understanding of mental health resources and other crisis services in the community.he is instructed to call 911 and present to the nearest emergency room should he experience any suicidal/homicidal ideation, auditory/visual/hallucinations, or detrimental worsening of  his  mental health condition. he was a also advised by Clinical research associate that he could call the toll-free phone on insurance card to assist with identifying in network counselors and agencies or number on back of Medicaid card to speak with care coordinator.   Flowsheet Row ED from 04/26/2022 in [facility] Admission (Discharged) [facility] from 02/25/2022 in [facility] Department at [facility]  C-SSRS RISK CATEGORY No Risk No Risk High Risk       Psychiatric Specialty Exam  Presentation  General Appearance:Appropriate for Environment; Casual; Fairly Groomed  Eye Contact:Fair  Speech:Clear and Coherent; Normal Rate  Speech Volume:Normal  Handedness:Right   Mood and Affect  Mood: Euthymic  Affect: Appropriate; Full Range; Congruent   Thought Process  Thought Processes: Coherent; Goal Directed; Linear  Descriptions of Associations:Intact  Orientation:Full (Time, Place and Person)  Thought Content:Logical; WDL  Diagnosis of Schizophrenia or Schizoaffective disorder in past: No   Hallucinations:None  Ideas of Reference:None  Suicidal Thoughts:No  Homicidal Thoughts:No Without Plan   Sensorium  Memory: Immediate Fair  Judgment: Fair  Insight: Fair   Executive Functions  Concentration: Good  Attention Span: Good  Recall: Good  Fund of Knowledge: Good  Language: Good   Psychomotor Activity  Psychomotor Activity: Normal   Assets  Assets: Communication Skills; Desire for Improvement; Resilience   Sleep  Sleep: Good  Number of hours:  4   Physical Exam: Physical Exam Vitals and nursing note reviewed.  Constitutional:      Appearance: Normal appearance.  Cardiovascular:     Rate and Rhythm: Normal rate and regular rhythm.  Neurological:     Mental  Status: He is alert and oriented to person, place, and time.  Psychiatric:        Mood and  Affect: Mood normal.        Behavior: Behavior normal.        Thought Content: Thought content normal.    Review of Systems  Respiratory: Negative.    Cardiovascular: Negative.   Psychiatric/Behavioral:  Negative for depression and suicidal ideas. The patient is nervous/anxious.   All other systems reviewed and are negative.  Blood pressure (!) 146/91, pulse 71, temperature 97.7 F (36.5 C), temperature source Oral, resp. rate 19, SpO2 100 %. There is no height or weight on file to calculate BMI.  Musculoskeletal: Strength & Muscle Tone: within normal limits Gait & Station: normal Patient leans: N/A   [facility] MSE Discharge Disposition for Follow up and Recommendations: Based on my evaluation the patient does not appear to have an emergency medical condition and can be discharged with resources and follow up care in outpatient services for Medication Management and Individual Therapy   Oneta Rack, NP 04/26/2022, 11:51 AM

## 2022-04-26 NOTE — Discharge Instructions (Signed)
Take all medications as prescribed. Keep all follow-up appointments as scheduled.  Do not consume alcohol or use illegal drugs while on prescription medications. Report any adverse effects from your medications to your primary care provider promptly.  In the event of recurrent symptoms or worsening symptoms, call 911, a crisis hotline, or go to the nearest emergency department for evaluation.   

## 2022-04-27 ENCOUNTER — Ambulatory Visit (INDEPENDENT_AMBULATORY_CARE_PROVIDER_SITE_OTHER): Payer: Medicaid Other | Admitting: Psychiatry

## 2022-04-27 ENCOUNTER — Encounter (HOSPITAL_COMMUNITY): Payer: Self-pay | Admitting: Psychiatry

## 2022-04-27 VITALS — BP 111/66 | HR 66 | Wt 144.2 lb

## 2022-04-27 DIAGNOSIS — F17200 Nicotine dependence, unspecified, uncomplicated: Secondary | ICD-10-CM | POA: Diagnosis not present

## 2022-04-27 DIAGNOSIS — F1290 Cannabis use, unspecified, uncomplicated: Secondary | ICD-10-CM

## 2022-04-27 DIAGNOSIS — F3110 Bipolar disorder, current episode manic without psychotic features, unspecified: Secondary | ICD-10-CM | POA: Diagnosis not present

## 2022-04-27 DIAGNOSIS — G4700 Insomnia, unspecified: Secondary | ICD-10-CM | POA: Diagnosis not present

## 2022-04-27 MED ORDER — INVEGA SUSTENNA 234 MG/1.5ML IM SUSY: 234.0000 mg | PREFILLED_SYRINGE | Freq: Once | INTRAMUSCULAR | 0 refills | Status: DC

## 2022-04-27 MED ORDER — BENZTROPINE MESYLATE 1 MG PO TABS: 1.0000 mg | ORAL_TABLET | Freq: Two times a day (BID) | ORAL | 3 refills | Status: DC

## 2022-04-27 MED ORDER — NICOTINE POLACRILEX 2 MG MT GUM: 2.0000 mg | CHEWING_GUM | OROMUCOSAL | 3 refills | Status: DC | PRN

## 2022-04-27 MED ORDER — TRAZODONE HCL 100 MG PO TABS: 100.0000 mg | ORAL_TABLET | Freq: Every evening | ORAL | 3 refills | Status: DC | PRN

## 2022-04-27 MED ORDER — PALIPERIDONE PALMITATE ER 234 MG/1.5ML IM SUSY
234.0000 mg | PREFILLED_SYRINGE | Freq: Once | INTRAMUSCULAR | Status: AC
  Administered 2022-06-18: 234 mg via INTRAMUSCULAR

## 2022-04-27 MED ORDER — PALIPERIDONE ER 6 MG PO TB24: 6.0000 mg | ORAL_TABLET | Freq: Every day | ORAL | 0 refills | Status: DC

## 2022-04-27 MED ORDER — DIVALPROEX SODIUM 500 MG PO DR TAB: 500.0000 mg | DELAYED_RELEASE_TABLET | Freq: Two times a day (BID) | ORAL | 3 refills | Status: DC
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
## 2022-04-30 ENCOUNTER — Other Ambulatory Visit (HOSPITAL_COMMUNITY): Payer: Self-pay | Admitting: Psychiatry

## 2022-04-30 ENCOUNTER — Encounter (HOSPITAL_COMMUNITY): Payer: Medicaid Other | Admitting: Student in an Organized Health Care Education/Training Program

## 2022-04-30 DIAGNOSIS — F3110 Bipolar disorder, current episode manic without psychotic features, unspecified: Secondary | ICD-10-CM

## 2022-05-05 ENCOUNTER — Ambulatory Visit (HOSPITAL_COMMUNITY): Payer: 59

## 2022-05-05 ENCOUNTER — Emergency Department (EMERGENCY_DEPARTMENT_HOSPITAL)
Admission: EM | Admit: 2022-05-05 | Discharge: 2022-05-06 | Disposition: A | Discharge status: Other Acute Inpatient Hospital | Payer: Medicaid Other | Source: Home / Self Care | Attending: Emergency Medicine | Admitting: Emergency Medicine

## 2022-05-05 ENCOUNTER — Emergency Department (HOSPITAL_COMMUNITY)
Admission: EM | Admit: 2022-05-05 | Discharge: 2022-05-05 | Disposition: A | Discharge status: Home / Self Care | Payer: Medicaid Other | Attending: Emergency Medicine | Admitting: Emergency Medicine

## 2022-05-05 ENCOUNTER — Other Ambulatory Visit: Payer: Self-pay

## 2022-05-05 ENCOUNTER — Ambulatory Visit (HOSPITAL_COMMUNITY): Payer: 59 | Admitting: Mental Health

## 2022-05-05 DIAGNOSIS — F319 Bipolar disorder, unspecified: Secondary | ICD-10-CM | POA: Insufficient documentation

## 2022-05-05 DIAGNOSIS — F3110 Bipolar disorder, current episode manic without psychotic features, unspecified: Secondary | ICD-10-CM | POA: Diagnosis present

## 2022-05-05 DIAGNOSIS — R443 Hallucinations, unspecified: Secondary | ICD-10-CM | POA: Diagnosis present

## 2022-05-05 DIAGNOSIS — F3010 Manic episode without psychotic symptoms, unspecified: Secondary | ICD-10-CM

## 2022-05-05 LAB — COMPREHENSIVE METABOLIC PANEL
ALT: 23 U/L (ref 0–44)
AST: 27 U/L (ref 15–41)
Albumin: 4.8 g/dL (ref 3.5–5.0)
Alkaline Phosphatase: 69 U/L (ref 38–126)
Anion gap: 11 (ref 5–15)
BUN: 24 mg/dL — ABNORMAL HIGH (ref 6–20)
CO2: 24 mmol/L (ref 22–32)
Calcium: 9.3 mg/dL (ref 8.9–10.3)
Chloride: 100 mmol/L (ref 98–111)
Creatinine, Ser: 1.09 mg/dL (ref 0.61–1.24)
GFR, Estimated: >60 mL/min (ref >60)
Glucose, Bld: 85 mg/dL (ref 70–99)
Potassium: 4.1 mmol/L (ref 3.5–5.1)
Sodium: 135 mmol/L (ref 135–145)
Total Bilirubin: 0.8 mg/dL (ref 0.3–1.2)
Total Protein: 8.8 g/dL — ABNORMAL HIGH (ref 6.5–8.1)

## 2022-05-05 LAB — RAPID URINE DRUG SCREEN, HOSP PERFORMED
Amphetamines: NOT DETECTED
Barbiturates: NOT DETECTED
Benzodiazepines: NOT DETECTED
Cocaine: NOT DETECTED
Opiates: NOT DETECTED
Tetrahydrocannabinol: POSITIVE — AB

## 2022-05-05 LAB — CBC
HCT: 42.5 % (ref 39.0–52.0)
Hemoglobin: 13.7 g/dL (ref 13.0–17.0)
MCH: 28.1 pg (ref 26.0–34.0)
MCHC: 32.2 g/dL (ref 30.0–36.0)
MCV: 87.1 fL (ref 80.0–100.0)
Platelets: 300 10*3/uL (ref 150–400)
RBC: 4.88 MIL/uL (ref 4.22–5.81)
RDW: 15.7 % — ABNORMAL HIGH (ref 11.5–15.5)
WBC: 10.8 10*3/uL — ABNORMAL HIGH (ref 4.0–10.5)
nRBC: 0 % (ref 0.0–0.2)

## 2022-05-05 LAB — ACETAMINOPHEN LEVEL: Acetaminophen (Tylenol), Serum: <10 ug/mL — ABNORMAL LOW (ref 10–30)

## 2022-05-05 LAB — SALICYLATE LEVEL: Salicylate Lvl: <7 mg/dL — ABNORMAL LOW (ref 7.0–30.0)

## 2022-05-05 LAB — ETHANOL: Alcohol, Ethyl (B): <10 mg/dL (ref <10)

## 2022-05-05 MED ORDER — PALIPERIDONE ER 6 MG PO TB24
6.0000 mg | ORAL_TABLET | Freq: Every day | ORAL | Status: DC
  Administered 2022-05-05: 6 mg via ORAL
  Filled 2022-05-05 (×2): qty 1

## 2022-05-05 MED ORDER — PALIPERIDONE PALMITATE ER 234 MG/1.5ML IM SUSY
234.0000 mg | PREFILLED_SYRINGE | Freq: Once | INTRAMUSCULAR | Status: AC
  Administered 2022-05-06: 234 mg via INTRAMUSCULAR

## 2022-05-05 MED ORDER — PALIPERIDONE PALMITATE ER 234 MG/1.5ML IM SUSY: 234.0000 mg | PREFILLED_SYRINGE | Freq: Once | INTRAMUSCULAR | Status: DC

## 2022-05-05 MED ORDER — ZIPRASIDONE MESYLATE 20 MG IM SOLR
20.0000 mg | Freq: Once | INTRAMUSCULAR | Status: AC
  Administered 2022-05-05: 20 mg via INTRAMUSCULAR
  Filled 2022-05-05: qty 20

## 2022-05-05 MED ORDER — LORAZEPAM 1 MG PO TABS: 1.0000 mg | ORAL_TABLET | Freq: Three times a day (TID) | ORAL | Status: DC | PRN

## 2022-05-05 MED ORDER — DIVALPROEX SODIUM 500 MG PO DR TAB
500.0000 mg | DELAYED_RELEASE_TABLET | Freq: Two times a day (BID) | ORAL | Status: DC
  Administered 2022-05-06: 500 mg via ORAL
  Filled 2022-05-05 (×2): qty 1

## 2022-05-05 MED ORDER — BENZTROPINE MESYLATE 1 MG PO TABS
1.0000 mg | ORAL_TABLET | Freq: Two times a day (BID) | ORAL | Status: DC
  Administered 2022-05-06: 1 mg via ORAL
  Filled 2022-05-05 (×2): qty 1

## 2022-05-05 NOTE — Discharge Instructions (Addendum)
Follow up with behavioral health at 10:00am today as scheduled

## 2022-05-05 NOTE — ED Provider Notes (Signed)
[place] EMERGENCY DEPARTMENT AT [facility] Provider Note   CSN: [id number] Arrival date & time: 05/05/22  1616     History  Chief Complaint  Patient presents with   Manic Behavior    Derrick Joseph is a 22 y.o. male.  22 yo M with a chief complaints of agitation.  Patient tells me he is fine and just needs an injection of an antipsychotic and he can go home.  I discussed the case with his mom who said that he has not slept in 4 days has been arrested multiple times for trespassing.  She does not feel like he is safe to go back home and is worried that he may be a risk to himself and/or others.        Home Medications Prior to Admission medications   Medication Sig Start Date End Date Taking? Authorizing Provider  benztropine (COGENTIN) 1 MG tablet Take 1 tablet (1 mg total) by mouth 2 (two) times daily. 04/27/22   Shanna Cisco, NP  divalproex (DEPAKOTE) 500 MG DR tablet Take 1 tablet (500 mg total) by mouth every 12 (twelve) hours. 04/27/22   Shanna Cisco, NP  nicotine polacrilex (NICORETTE) 2 MG gum Take 1 each (2 mg total) by mouth as needed for smoking cessation. Patient not taking: Reported on 05/05/2022 04/27/22   Shanna Cisco, NP  paliperidone (INVEGA SUSTENNA) 234 MG/1.5ML injection Inject 234 mg into the muscle once for 1 dose. 04/27/22 04/27/22  Shanna Cisco, NP  paliperidone (INVEGA) 6 MG 24 hr tablet TAKE 1 TABLET BY MOUTH DAILY Patient not taking: Reported on 05/05/2022 04/30/22   Shanna Cisco, NP  traZODone (DESYREL) 100 MG tablet Take 1 tablet (100 mg total) by mouth at bedtime as needed for sleep. 04/27/22   Shanna Cisco, NP      Allergies    Patient has no known allergies.    Review of Systems   Review of Systems  Physical Exam Updated Vital Signs BP (!) 134/95 (BP Location: Left Arm)   Pulse 89   Temp 99 F (37.2 C) (Oral)   Resp 18   SpO2 100%  Physical Exam Vitals and nursing note reviewed.   Constitutional:      Appearance: He is well-developed.  HENT:     Head: Normocephalic and atraumatic.  Eyes:     Pupils: Pupils are equal, round, and reactive to light.  Neck:     Vascular: No JVD.  Cardiovascular:     Rate and Rhythm: Normal rate and regular rhythm.     Heart sounds: No murmur heard.    No friction rub. No gallop.  Pulmonary:     Effort: No respiratory distress.     Breath sounds: No wheezing.  Abdominal:     General: There is no distension.     Tenderness: There is no abdominal tenderness. There is no guarding or rebound.  Musculoskeletal:        General: Normal range of motion.     Cervical back: Normal range of motion and neck supple.  Skin:    Coloration: Skin is not pale.     Findings: No rash.  Neurological:     Mental Status: He is alert and oriented to person, place, and time.  Psychiatric:        Mood and Affect: Mood is anxious.        Behavior: Behavior is hyperactive.        Thought Content: Thought  content does not include homicidal or suicidal ideation. Thought content does not include homicidal or suicidal plan.     ED Results / Procedures / Treatments   Labs (all labs ordered are listed, but only abnormal results are displayed) Labs Reviewed  COMPREHENSIVE METABOLIC PANEL - Abnormal; Notable for the following components:      Result Value   BUN 24 (*)    Total Protein 8.8 (*)    All other components within normal limits  SALICYLATE LEVEL - Abnormal; Notable for the following components:   Salicylate Lvl <7.0 (*)    All other components within normal limits  ACETAMINOPHEN LEVEL - Abnormal; Notable for the following components:   Acetaminophen (Tylenol), Serum <10 (*)    All other components within normal limits  CBC - Abnormal; Notable for the following components:   WBC 10.8 (*)    RDW 15.7 (*)    All other components within normal limits  RAPID URINE DRUG SCREEN, HOSP PERFORMED - Abnormal; Notable for the following components:    Tetrahydrocannabinol POSITIVE (*)    All other components within normal limits  ETHANOL    EKG None  Radiology No results found.  Procedures Procedures    Medications Ordered in ED Medications  divalproex (DEPAKOTE) DR tablet 500 mg (500 mg Oral Given 05/05/22 2053)  benztropine (COGENTIN) tablet 1 mg (1 mg Oral Given 05/05/22 2054)  paliperidone (INVEGA) 24 hr tablet 6 mg (6 mg Oral Given 05/05/22 1840)  paliperidone (INVEGA SUSTENNA) injection 234 mg (has no administration in time range)  LORazepam (ATIVAN) tablet 1 mg (has no administration in time range)  ziprasidone (GEODON) injection 20 mg (20 mg Intramuscular Given 05/05/22 2051)    ED Course/ Medical Decision Making/ A&P                             Medical Decision Making Amount and/or Complexity of Data Reviewed Labs: ordered.  Risk Prescription drug management.   22 yo M with a chief complaint of mania.  Patient has a history of bipolar disorder is not on his medications.  He thinks he was post to get an injection today but did not get 1.  He is here to get an injection and then go home.  I discussed case with his mother who tells me that he was arrested multiple times today.  She does not feel like he is safe to be home and feels like he needs to be in an inpatient psychiatric facility to get back on his medications so that he can be functional in society.  TTS evaluation.  Feel he is medically clear.  No significant electrolyte abnormality, no anemia, Tylenol and salicylate levels are negative.  Multiple psychiatric patients are unfortunately becoming unruly and are building up energy with each other.  This patient was involved in the to and unfortunately is no longer able to be redirected verbally and is unwilling to take oral medications.  Will chemically sedate.  The patients results and plan were reviewed and discussed.   Any x-rays performed were independently reviewed by myself.   Differential diagnosis  were considered with the presenting HPI.  Medications  divalproex (DEPAKOTE) DR tablet 500 mg (500 mg Oral Given 05/05/22 2053)  benztropine (COGENTIN) tablet 1 mg (1 mg Oral Given 05/05/22 2054)  paliperidone (INVEGA) 24 hr tablet 6 mg (6 mg Oral Given 05/05/22 1840)  paliperidone (INVEGA SUSTENNA) injection 234 mg (has no administration in  time range)  LORazepam (ATIVAN) tablet 1 mg (has no administration in time range)  ziprasidone (GEODON) injection 20 mg (20 mg Intramuscular Given 05/05/22 2051)    Vitals:   05/05/22 1634  BP: (!) 134/95  Pulse: 89  Resp: 18  Temp: 99 F (37.2 C)  TempSrc: Oral  SpO2: 100%    Final diagnoses:  Manic behavior (HCC)           Final Clinical Impression(s) / ED Diagnoses Final diagnoses:  Manic behavior [facility])    Rx / DC Orders ED Discharge Orders     None         Melene Plan, DO 05/05/22 2100

## 2022-05-05 NOTE — Consult Note (Signed)
[facility] ED ASSESSMENT   Reason for Consult:  Psychiatry evaluation Referring Physician:  ER Physician Patient Identification: Derrick Joseph MRN:  [id number] ED Chief Complaint: Bipolar disorder, current episode manic without psychotic features (HCC)  Diagnosis:  Principal Problem:   Bipolar disorder, current episode manic without psychotic features [facility])   ED Assessment Time Calculation: Start Time: 1823 Stop Time: 1850 Total Time in Minutes (Assessment Completion): 27   Subjective:   Derrick Joseph is a 22 y.o. male patient admitted with hx significant for Bipolar disorder, manic and Cannabis use disorder, moderate was brought in this evening by [facility] for manic behavior.  On arrival patient was friendly but loud and delusional.  He claims his father owns this hospital and that the [place] belongs to his family.  Patient, per record was hospitalized at [facility] in March and he receives outpatient Psychiatry care at [facility]. HPI:  Patient was seen in his room pacing around.  He reports that this morning he went to [facility] ER to get his LAI Invega.  He also reports that he was sent away for trespassing.  Patient reports he called the [facility], a friend of his to bring him to [place] for his injection.  He was on oral dose of Invega 6 mg 24 hrs  daily.  Based on chart review shows he has been on  Haldol, Depakote and Wellbutrin and Cogentin in the past..  Patient states he prefers injections to be given once a month.  He is willing to take the injection without problem.  Patient was restless, speaking loud with flight of ideas, unable to stay on topic or focus but easily redirectable.  Patient reports he smokes "weed" daily and plans to continue smoking.  He drinks Alcohol occasionally and he is unemployed.  Patient lives with his mother he says and he admits that he has not been sleeping and appetite is fairly alright.   AA Male, 21 years old well groomed, alert and oriented x5, restless, poor  focus and argumentative but easily redirectable.  Patient has great insight in his mental illness.  He admits that he has been diagnosed with Bipolar for a while and that he has tried so many oral Medications.  He plans to stay on Monthly injection and plans to use [facility] for his Mental health provider and will be getting his Monthly injection there. His invega injection 234 mg has been ordered to be delivered in am.  He agrees to start oral dose of Invega 6 mg , 24 hrs tonight with Cogentin and Depakote.  Patient is also ordered to have EKG done for QTC interval evaluation before the injection tomorrow morning. Past Psychiatric History:  Hx significant for Bipolar disorder, manic and Cannabis use disorder..Previous inpatient Psychiatry hospitalizations 2021, 2022 and this last March.  Previous inpatient Hospitalization at [facility] and one suicide attempt.  Medications tried in the past -Haldol, Depakote and Wellbutrin and Cogentin, Lithium, Olanzapine and Ativan.  Outpatient provider is Toy Cookey  at [facility].  Risk to Self or Others: Is the patient at risk to self? No Has the patient been a risk to self in the past 6 months? No Has the patient been a risk to self within the distant past? No Is the patient a risk to others? No Has the patient been a risk to others in the past 6 months? No Has the patient been a risk to others within the distant past? No  [place] Scale:  Flowsheet Row ED from  05/05/2022 in [facility] Emergency Department at [facility] Most recent reading at 05/05/2022  4:31 PM ED from 05/05/2022 in [facility] Emergency Department at [facility] Most recent reading at 05/05/2022  4:14 AM Office Visit from 04/27/2022 in [facility] Most recent reading at 04/27/2022  8:37 AM  C-SSRS RISK CATEGORY No Risk No Risk Error: Q3, 4, or 5 should not be populated when Q2 is No       AIMS:  , , ,  ,   ASAM:    Substance Abuse:     Past Medical  History: No past medical history on file. No past surgical history on file. Family History: No family history on file. Family Psychiatric  History: none, unknown Social History:  Social History   Substance and Sexual Activity  Alcohol Use Not Currently     Social History   Substance and Sexual Activity  Drug Use Not Currently    Social History   Socioeconomic History   Marital status: Single    Spouse name: Not on file   Number of children: Not on file   Years of education: Not on file   Highest education level: Not on file  Occupational History   Not on file  Tobacco Use   Smoking status: Every Day    Types: Cigarettes    Passive exposure: Current   Smokeless tobacco: Never  Vaping Use   Vaping Use: Every day   Substances: Nicotine, Flavoring  Substance and Sexual Activity   Alcohol use: Not Currently   Drug use: Not Currently   Sexual activity: Yes    Birth control/protection: Condom  Other Topics Concern   Not on file  Social History Narrative   Not on file   Social Determinants of Health   Financial Resource Strain: Not on file  Food Insecurity: No Food Insecurity (03/09/2022)   Hunger Vital Sign    Worried About Running Out of Food in the Last Year: Never true    Ran Out of Food in the Last Year: Never true  Transportation Needs: Patient Declined (03/09/2022)   PRAPARE - Administrator, Civil Service (Medical): Patient declined    Lack of Transportation (Non-Medical): Patient declined  Physical Activity: Not on file  Stress: Not on file  Social Connections: Not on file   Additional Social History:    Allergies:  No Known Allergies  Labs:  Results for orders placed or performed during the hospital encounter of 05/05/22 (from the past 48 hour(s))  Rapid urine drug screen (hospital performed)     Status: Abnormal   Collection Time: 05/05/22  4:36 PM  Result Value Ref Range   Opiates NONE DETECTED NONE DETECTED   Cocaine NONE DETECTED NONE  DETECTED   Benzodiazepines NONE DETECTED NONE DETECTED   Amphetamines NONE DETECTED NONE DETECTED   Tetrahydrocannabinol POSITIVE (A) NONE DETECTED   Barbiturates NONE DETECTED NONE DETECTED    Comment: (NOTE) DRUG SCREEN FOR MEDICAL PURPOSES ONLY.  IF CONFIRMATION IS NEEDED FOR ANY PURPOSE, NOTIFY LAB WITHIN 5 DAYS.  LOWEST DETECTABLE LIMITS FOR URINE DRUG SCREEN Drug Class                     Cutoff (ng/mL) Amphetamine and metabolites    1000 Barbiturate and metabolites    200 Benzodiazepine                 200 Opiates and metabolites  300 Cocaine and metabolites        300 THC                            50 Performed at [facility], [address], [place], [place] [address]   Comprehensive metabolic panel     Status: Abnormal   Collection Time: 05/05/22  4:49 PM  Result Value Ref Range   Sodium 135 135 - 145 mmol/L   Potassium 4.1 3.5 - 5.1 mmol/L   Chloride 100 98 - 111 mmol/L   CO2 24 22 - 32 mmol/L   Glucose, Bld 85 70 - 99 mg/dL    Comment: Glucose reference range applies only to samples taken after fasting for at least 8 hours.   BUN 24 (H) 6 - 20 mg/dL   Creatinine, Ser 0.27 0.61 - 1.24 mg/dL   Calcium 9.3 8.9 - 25.3 mg/dL   Total Protein 8.8 (H) 6.5 - 8.1 g/dL   Albumin 4.8 3.5 - 5.0 g/dL   AST 27 15 - 41 U/L   ALT 23 0 - 44 U/L   Alkaline Phosphatase 69 38 - 126 U/L   Total Bilirubin 0.8 0.3 - 1.2 mg/dL   GFR, Estimated >66 >44 mL/min    Comment: (NOTE) Calculated using the CKD-EPI Creatinine Equation (2021)    Anion gap 11 5 - 15    Comment: Performed at [facility], [address], [place], [place] [address]  Ethanol     Status: None   Collection Time: 05/05/22  4:49 PM  Result Value Ref Range   Alcohol, Ethyl (B) <10 <10 mg/dL    Comment: (NOTE) Lowest detectable limit for serum alcohol is 10 mg/dL.  For medical purposes only. Performed at [facility], [address] [place], [place] [address]   Salicylate level     Status: Abnormal   Collection Time: 05/05/22  4:49 PM  Result Value Ref Range   Salicylate Lvl <7.0 (L) 7.0 - 30.0 mg/dL    Comment: Performed at [facility], [address], [place], [place] [address]  Acetaminophen level     Status: Abnormal   Collection Time: 05/05/22  4:49 PM  Result Value Ref Range   Acetaminophen (Tylenol), Serum <10 (L) 10 - 30 ug/mL    Comment: (NOTE) Therapeutic concentrations vary significantly. A range of 10-30 ug/mL  may be an effective concentration for many patients. However, some  are best treated at concentrations outside of this range. Acetaminophen concentrations >150 ug/mL at 4 hours after ingestion  and >50 ug/mL at 12 hours after ingestion are often associated with  toxic reactions.  Performed at [facility], [address], [place], [place] [address]   cbc     Status: Abnormal   Collection Time: 05/05/22  4:49 PM  Result Value Ref Range   WBC 10.8 (H) 4.0 - 10.5 K/uL   RBC 4.88 4.22 - 5.81 MIL/uL   Hemoglobin 13.7 13.0 - 17.0 g/dL   HCT 51.8 84.1 - 66.0 %   MCV 87.1 80.0 - 100.0 fL   MCH 28.1 26.0 - 34.0 pg   MCHC 32.2 30.0 - 36.0 g/dL   RDW 63.0 (H) 16.0 - 10.9 %   Platelets 300 150 - 400 K/uL   nRBC 0.0 0.0 - 0.2 %    Comment: Performed at [facility], [address] [place], [place]  [address]    Current Facility-Administered Medications  Medication Dose Route Frequency Provider Last Rate Last Admin   benztropine (COGENTIN) tablet 1 mg  1 mg Oral BID Quinlin Conant C, NP       divalproex (DEPAKOTE) DR tablet 500 mg  500 mg Oral Q12H Shifa Brisbon C, NP       paliperidone (INVEGA SUSTENNA) injection 234 mg  234 mg Intramuscular Once Toy Cookey E, NP       [START ON 05/06/2022] paliperidone (INVEGA SUSTENNA) injection 234 mg  234 mg Intramuscular Once Dahlia Byes C, NP       paliperidone (INVEGA) 24 hr  tablet 6 mg  6 mg Oral Daily Saamir Armstrong C, NP   6 mg at 05/05/22 1840   Current Outpatient Medications  Medication Sig Dispense Refill   benztropine (COGENTIN) 1 MG tablet Take 1 tablet (1 mg total) by mouth 2 (two) times daily. 60 tablet 3   divalproex (DEPAKOTE) 500 MG DR tablet Take 1 tablet (500 mg total) by mouth every 12 (twelve) hours. 60 tablet 3   nicotine polacrilex (NICORETTE) 2 MG gum Take 1 each (2 mg total) by mouth as needed for smoking cessation. (Patient not taking: Reported on 05/05/2022) 100 tablet 3   paliperidone (INVEGA SUSTENNA) 234 MG/1.5ML injection Inject 234 mg into the muscle once for 1 dose. 1.5 mL 0   paliperidone (INVEGA) 6 MG 24 hr tablet TAKE 1 TABLET BY MOUTH DAILY (Patient not taking: Reported on 05/05/2022) 7 tablet 0   traZODone (DESYREL) 100 MG tablet Take 1 tablet (100 mg total) by mouth at bedtime as needed for sleep. 30 tablet 3    Musculoskeletal: Strength & Muscle Tone: within normal limits Gait & Station: normal Patient leans: Front   Psychiatric Specialty Exam: Presentation  General Appearance:  Casual; Neat; Well Groomed  Eye Contact: Good  Speech: Clear and Coherent; Pressured  Speech Volume: Increased  Handedness: Right   Mood and Affect  Mood: Euphoric  Affect: Congruent; Full Range   Thought Process  Thought Processes: Coherent; Goal Directed; Linear  Descriptions of Associations:Intact  Orientation:Full (Time, Place and Person)  Thought Content:Logical; Illogical  History of Schizophrenia/Schizoaffective disorder:No  Duration of Psychotic Symptoms:N/A  Hallucinations:Hallucinations: None  Ideas of Reference:Delusions ([place])  Suicidal Thoughts:Suicidal Thoughts: No  Homicidal Thoughts:Homicidal Thoughts: No   Sensorium  Memory: Immediate Good; Recent Good; Remote Good  Judgment: Good  Insight: Good   Executive Functions  Concentration: Good  Attention  Span: Good  Recall: Good  Fund of Knowledge: Good  Language: Good   Psychomotor Activity  Psychomotor Activity: Psychomotor Activity: Normal   Assets  Assets: Housing; Desire for Improvement; Social Support; Communication Skills    Sleep  Sleep: Sleep: Fair   Physical Exam: Physical Exam Vitals and nursing note reviewed.  Constitutional:      Appearance: Normal appearance.  HENT:     Head: Normocephalic and atraumatic.     Nose: Nose normal.  Cardiovascular:     Rate and Rhythm: Normal rate and regular rhythm.  Pulmonary:     Effort: Pulmonary effort is normal.  Musculoskeletal:        General: Normal range of motion.     Cervical back: Normal range of motion.  Skin:    General: Skin is warm and dry.  Neurological:     Mental Status: He is alert and oriented to person, place, and time.  Psychiatric:        Attention and Perception: Attention and perception  normal.        Mood and Affect: Mood is elated.        Speech: Speech is rapid and pressured.        Behavior: Behavior is hyperactive. Behavior is cooperative.        Thought Content: Thought content is delusional.        Cognition and Memory: Cognition and memory normal.        Judgment: Judgment normal.    Review of Systems  Constitutional: Negative.   HENT: Negative.    Eyes: Negative.   Respiratory: Negative.    Cardiovascular: Negative.   Gastrointestinal: Negative.   Genitourinary: Negative.   Musculoskeletal: Negative.   Skin: Negative.   Neurological: Negative.   Endo/Heme/Allergies: Negative.   Psychiatric/Behavioral:  Positive for substance abuse. The patient has insomnia.    Blood pressure (!) 134/95, pulse 89, temperature 99 F (37.2 C), temperature source Oral, resp. rate 18, SpO2 100 %. There is no height or weight on file to calculate BMI.  Medical Decision Making: Patient denies SI/HI/AVH but remains delusional-grandiose- his family owns this hospital and [place] belong to his family.  He has good insight in his mental illness and is willing to take his LAI .  He will receive first dose of injection in am.  Meanwhile he will start oral dose of Invega tonight with Cogentin and Depakote.  We will offer Ativan as needed every eight hours for agitation and anxiety. Problem 1: Bipolar 1 disorder, manic  Problem 2: Cannabis use disorder, Moderate.  Disposition:  Monitor overnight, Offer Invega injection and discharge in the evening tomorrow.   Obtain EKG before administering injection.  Earney Navy, NP-PMHNP-BC 05/05/2022 6:57 PM

## 2022-05-05 NOTE — ED Triage Notes (Addendum)
Patient requesting psychiatric evaluation for his hallucinations and paranoid attack this morning . Flights of ideas / unable to focus and agitated during initial encounter at triage .

## 2022-05-05 NOTE — ED Provider Notes (Signed)
[facility] AT [facility] Provider Note   CSN: [id number] Arrival date & time: 05/05/22  0405     History  Chief Complaint  Patient presents with   Psychiatric Evaluation     Derrick Joseph is a 22 y.o. male.  22 year old male brought in by [employer]. Patient states he called the sheriff because he is married to the sheriff and needed a ride because he has an appointment at 10:00 at behavioral health for his Invega injection. Patient tells me he is the king of [place] and is requesting to talk to [employer] to arrange his security detail and motorcade. Also tells me he works for the [employer]. Reports compliance with his PO Invega as started at behavioral health on 04/27/22. He has called his mom already from the ER and states she will be here at 9am to take him to the barber ahead of his behavioral health appointment.        Home Medications Prior to Admission medications   Medication Sig Start Date End Date Taking? Authorizing Provider  benztropine (COGENTIN) 1 MG tablet Take 1 tablet (1 mg total) by mouth 2 (two) times daily. 04/27/22  Yes Toy Cookey E, NP  divalproex (DEPAKOTE) 500 MG DR tablet Take 1 tablet (500 mg total) by mouth every 12 (twelve) hours. 04/27/22  Yes Toy Cookey E, NP  traZODone (DESYREL) 100 MG tablet Take 1 tablet (100 mg total) by mouth at bedtime as needed for sleep. 04/27/22  Yes Toy Cookey E, NP  nicotine polacrilex (NICORETTE) 2 MG gum Take 1 each (2 mg total) by mouth as needed for smoking cessation. Patient not taking: Reported on 05/05/2022 04/27/22   Shanna Cisco, NP  paliperidone (INVEGA SUSTENNA) 234 MG/1.5ML injection Inject 234 mg into the muscle once for 1 dose. 04/27/22 04/27/22  Shanna Cisco, NP  paliperidone (INVEGA) 6 MG 24 hr tablet TAKE 1 TABLET BY MOUTH DAILY Patient not taking: Reported on 05/05/2022 04/30/22   Shanna Cisco, NP      Allergies    Patient has no known  allergies.    Review of Systems   Review of Systems Level 5 caveat for psychiatric condition  Physical Exam Updated Vital Signs BP 124/73 (BP Location: Right Arm)   Pulse 83   Temp 98 F (36.7 C)   Resp 18   Ht 5\' 7"  (1.702 m)   Wt 65.8 kg   SpO2 99%   BMI 22.71 kg/m  Physical Exam Vitals and nursing note reviewed.  Constitutional:      General: He is not in acute distress.    Appearance: He is well-developed. He is not diaphoretic.  HENT:     Head: Normocephalic and atraumatic.  Pulmonary:     Effort: Pulmonary effort is normal.  Neurological:     Mental Status: He is alert and oriented to person, place, and time.  Psychiatric:        Speech: Speech is tangential.        Behavior: Behavior is hyperactive.        Thought Content: Thought content is not paranoid. Thought content does not include homicidal or suicidal ideation.     ED Results / Procedures / Treatments   Labs (all labs ordered are listed, but only abnormal results are displayed) Labs Reviewed - No data to display  EKG None  Radiology No results found.  Procedures Procedures    Medications Ordered in ED Medications - No  data to display  ED Course/ Medical Decision Making/ A&P                             Medical Decision Making  This patient presents to the ED for concern of needing somewhere to wait until his appointment with behavioral health at 10:00 today, this involves an extensive number of treatment options, and is a complaint that carries with it a high risk of complications and morbidity.  The differential diagnosis includes but not limited to mania, malingering    Co morbidities that complicate the patient evaluation  Bipolar disorder, insomnia, marijuana use   Additional history obtained:  External records from outside source obtained and reviewed including recent visit to behavioral health dated 04/26/21, dc haldol, started on Invega PO with plan to follow up today for first  injection   Consultations Obtained:  I requested consultation with the ER attending, Dr. Bebe Shaggy,  and discussed lab and imaging findings as well as pertinent plan - they recommend: agrees with plan of care   Problem List / ED Course / Critical interventions / Medication management  22 year old male presents requesting someplace to wait until his appointment with behavioral health today at 10:00. Patient is aware his appointment is at [address] and not at this hospital. He would like to go to the psych ward so that he can take a shower. Would also like to talk to [employer] to arrange his security detail, when told we would not be calling [employer], he requests call to 911- also informed this is not necessary. Patient has already called his mom who is aware he is in the ER, states his girlfriend kicked him out today so he came to the ER. Suspect patient is manic (tells staff he is the king of , works for the [employer]), not felt to be a threat to self of others. Patient refusing to wait in his room, is disruptive to staff with frequent interruptions in patient care. Patient is dc to follow up later today as scheduled with BH.  I have reviewed the patients home medicines and have made adjustments as needed   Social Determinants of Health:  Has behavioral health care team with appointment scheduled for 10:00am today. Has called his mom who is aware he is in the ER.    Test / Admission - Considered:  Discharge to [facility] with [facility] as scheduled today         Final Clinical Impression(s) / ED Diagnoses Final diagnoses:  Bipolar affective disorder, remission status unspecified [facility])    Rx / DC Orders ED Discharge Orders     None         Jeannie Fend, PA-C 05/05/22 0865    Zadie Rhine, MD 05/05/22 [phone]

## 2022-05-05 NOTE — ED Notes (Addendum)
Patient is sleeping. Patient had to get geodon per MD due to patient yelling and being uncooperative with staff. Staff and security was at bedside trying to redirect patient but those attempts were not successful. Patient was yelling back and forth with another patient. Patient was calling staff "B word". Patient was also upset with nurse because he had to receive geodon. Medical dose was necessary.

## 2022-05-05 NOTE — ED Triage Notes (Signed)
Patient in today via Sheriff reporting manic behavior. Off injection med "for some time". Last admission [facility] March 2024. Not sleeping. Pleasantly manic.

## 2022-05-06 DIAGNOSIS — F3110 Bipolar disorder, current episode manic without psychotic features, unspecified: Secondary | ICD-10-CM

## 2022-05-06 MED ORDER — LORAZEPAM 1 MG PO TABS: 1.0000 mg | ORAL_TABLET | ORAL | Status: DC | PRN

## 2022-05-06 MED ORDER — OLANZAPINE 5 MG PO TBDP: 5.0000 mg | ORAL_TABLET | Freq: Three times a day (TID) | ORAL | Status: DC | PRN

## 2022-05-06 MED ORDER — ZIPRASIDONE MESYLATE 20 MG IM SOLR: 20.0000 mg | INTRAMUSCULAR | Status: DC | PRN

## 2022-05-06 MED ORDER — ZIPRASIDONE MESYLATE 20 MG IM SOLR
20.0000 mg | Freq: Once | INTRAMUSCULAR | Status: AC
  Administered 2022-05-06: 20 mg via INTRAMUSCULAR
  Filled 2022-05-06: qty 20

## 2022-05-06 NOTE — ED Notes (Signed)
Called Report line to initiate report, left number on pager as per instructions. Awaiting call back

## 2022-05-06 NOTE — ED Notes (Signed)
Patient woke up angry. Patient yelling at nurse and other staff members. Patient started calling staff names and coming out of his room again. Patient is slamming doors and being very loud.

## 2022-05-06 NOTE — ED Notes (Signed)
Patient earring removed and placed in locker with belongings.

## 2022-05-06 NOTE — ED Notes (Signed)
Received call back from [facility], states in route with another transport and will come for this patient after. States will call again to give approximate ETA.

## 2022-05-06 NOTE — ED Notes (Signed)
Called sheriff line twice to set up transport, left voice message, after 5pm

## 2022-05-06 NOTE — Progress Notes (Signed)
LCSW Progress Note  [id number]   Derrick Joseph  05/06/2022  2:35 PM  Description:   Inpatient Psychiatric Referral  Patient was recommended inpatient per [facility], PMHNP. There are no available beds at [facility], per [facility] Rona Ravens, RN. Patient was referred to the following out of network facilities:   [facility] Provider Address Phone Fax  [facility]  [address], [place] [address] [phone]  [facility]  [address] [place] [address] [phone]  [facility]  [address] [place], [place] [address] [phone]  [facility] [address] [place]  [address] [place] [address] [phone]  [facility]  [address] [place], [place] [address] [phone]  [facility]  [address] [place] [address] [phone]  [facility]  [address] [place] [address] [phone]  [facility]  [address] [place] [address] [phone]  [facility]  [address] [place], [place] [address] [phone]  [facility]  [address] [place] [address] [phone]  [facility]  [address] [place] [address] [phone]  [facility]  [address] [place] [address] [phone]  [facility]  [address], [place] [address] [phone]  [facility]  [address], [place] [address] [phone]  [facility]  [address] [place] [address] [phone]  [facility]  [address] [place] [address] [phone]  [facility]  [address], [place] [address] [phone]  [facility]  [address] [place] [address] [phone]  [facility]  [address] [place] [address] [phone]  [facility]  [address], [place] [address] [phone]  [facility]  [address] [place] [address] [phone]  [facility]  [address], [place] [address] [phone]  [facility]  [address] [place] [address] [phone]  [facility]  [address] [place] [address] [phone]    Situation ongoing, CSW to continue following and update chart as more information becomes available.      Cathie Beams, [place]  05/06/2022 2:35 PM

## 2022-05-06 NOTE — ED Notes (Signed)
Assumed care of this pt. Pt ambulated to SAPU without incident. Pt is calm at this time eating a [place] sandwich.

## 2022-05-06 NOTE — Progress Notes (Signed)
Pt was accepted to [facility] TODAY 05/06/2022, pending IVC paperwork faxed to [phone]. Bed assignment: Main campus  Pt meets inpatient criteria per Alona Bene, PMHNP  Attending Physician will be Loni Beckwith, MD  Report can be called to: [phone] (this is a pager, please leave call-back number when giving report)  Bed is ready now  Care Team Notified: Alona Bene, PMHNP, Rebbeca Paul, Paramedic, Flaxton Ward, NT, and Rona Ravens, RN  [place], [place]  05/06/2022 2:52 PM

## 2022-05-06 NOTE — ED Notes (Signed)
Patient refers to himself as "the king". Patient is arguing with his sitter.

## 2022-05-06 NOTE — ED Notes (Signed)
Day RN just gave report to [place], Charity fundraiser at [facility]. Left number to call in case she had

## 2022-05-06 NOTE — ED Notes (Addendum)
Pt is up at window getting agitated because he wants to go home. He is able to be verbally deescalated.  He states "the lady told me I could go home after my injection". Pt has called his "brother" and mom.

## 2022-05-06 NOTE — ED Notes (Signed)
Assumed care of patient.

## 2022-05-06 NOTE — ED Notes (Signed)
Pt was informed that he would be admitted for inpatient. Pt is not happy about it but he is still cooperative at this time.

## 2022-05-06 NOTE — Progress Notes (Signed)
[facility] Psych ED Progress Note  05/06/2022 2:22 PM Derrick Joseph  MRN:  [id number]   Principal Problem: Bipolar disorder, current episode manic without psychotic features (HCC) Diagnosis:  Principal Problem:   Bipolar disorder, current episode manic without psychotic features [facility])   ED Assessment Time Calculation: Start Time: 1140 Stop Time: 1200 Total Time in Minutes (Assessment Completion): 20   Subjective:   On evaluation today, the patient is standing at the nurses station, in no acute distress. He is calm and cooperative during this assessment. His appearance is appropriate for environment. His eye contact is good.  Speech is clear and coherent, normal pace and normal volume. He reports his mood is euthymic. Affect is congruent with mood. Thought process is coherent and disorganized. Thought content is scattered. Patient was restless, with flight of ideas, unable to stay on topic or focus but easily redirectable. Patient is delusional, stating he is a Copy, and he owns this property, and several stores.He denies auditory and visual hallucinations.  No indication that he is responding to internal stimuli during this assessment. He denies suicidal ideations. He denies homicidal ideations. Patient stated his appetite and sleep are fair.  Patient states he lives with his mother and gets reasonable provide speak with his mother.  Writer spoke with patient's mother Sondra Barges and she reports patient has been staying with her since Sunday, April 28, states he was living with his father but it did not go well, his father brought patient to mother.  She states that patient is able to come and stay with her, but not until he has been adjusted to his new medication Gean Birchwood IM, she states that patient needs to be a little more stabilized on medication, and side effects need to be monitored as this is his first time receiving the injection.  She states that she feels that patient can be  drinking to self as he disrupts the businesses that are near her home, harassing the employers, and abusing using 911.  She states that patient has been trespassing and he was shopping centers and has had 13 inpatient psychiatric admissions.  She states that while the patient was manic, he is more delusional, his mood escalates and she states he can be verbally abusive towards her, so she states that she is relying on the Invega IM to help her swallow because he is not compliant with his oral medications.  She states that patient has outpatient appointments with [facility] and [facility], but misses a lot of appointments and does not receive help.   Past Psychiatric History: Hx significant for Bipolar disorder, manic and Cannabis use disorder..Previous inpatient Psychiatry hospitalizations 2021, 2022 and this last March. Previous inpatient Hospitalization at [facility] and one suicide attempt. Medications tried in the past -Haldol, Depakote and Wellbutrin and Cogentin, Lithium, Olanzapine and Ativan. Outpatient provider is Toy Cookey at [facility].    [place] Scale:  Flowsheet Row ED from 05/05/2022 in [facility] Emergency Department at [facility] Most recent reading at 05/06/2022  1:57 PM ED from 05/05/2022 in [facility] Emergency Department at [facility] Most recent reading at 05/05/2022  4:14 AM Office Visit from 04/27/2022 in [facility] Most recent reading at 04/27/2022  8:37 AM  C-SSRS RISK CATEGORY No Risk No Risk Error: Q3, 4, or 5 should not be populated when Q2 is No       Past Medical History: No past medical history on file. No past surgical history on file.  Family History: No family history on file.  Social History:  Social History   Substance and Sexual Activity  Alcohol Use Not Currently     Social History   Substance and Sexual Activity  Drug Use Not Currently    Social History   Socioeconomic History   Marital status: Single    Spouse  name: Not on file   Number of children: Not on file   Years of education: Not on file   Highest education level: Not on file  Occupational History   Not on file  Tobacco Use   Smoking status: Every Day    Types: Cigarettes    Passive exposure: Current   Smokeless tobacco: Never  Vaping Use   Vaping Use: Every day   Substances: Nicotine, Flavoring  Substance and Sexual Activity   Alcohol use: Not Currently   Drug use: Not Currently   Sexual activity: Yes    Birth control/protection: Condom  Other Topics Concern   Not on file  Social History Narrative   Not on file   Social Determinants of Health   Financial Resource Strain: Not on file  Food Insecurity: No Food Insecurity (03/09/2022)   Hunger Vital Sign    Worried About Running Out of Food in the Last Year: Never true    Ran Out of Food in the Last Year: Never true  Transportation Needs: Patient Declined (03/09/2022)   PRAPARE - Administrator, Civil Service (Medical): Patient declined    Lack of Transportation (Non-Medical): Patient declined  Physical Activity: Not on file  Stress: Not on file  Social Connections: Not on file    Sleep: Fair  Appetite:  Fair  Current Medications: Current Facility-Administered Medications  Medication Dose Route Frequency Provider Last Rate Last Admin   benztropine (COGENTIN) tablet 1 mg  1 mg Oral BID Dahlia Byes C, NP   1 mg at 05/06/22 1005   divalproex (DEPAKOTE) DR tablet 500 mg  500 mg Oral Q12H Onuoha, Josephine C, NP   500 mg at 05/06/22 1005   LORazepam (ATIVAN) tablet 1 mg  1 mg Oral Q8H PRN Onuoha, Josephine C, NP       OLANZapine zydis (ZYPREXA) disintegrating tablet 5 mg  5 mg Oral Q8H PRN Motley-Mangrum, Galo Sayed A, PMHNP       And   LORazepam (ATIVAN) tablet 1 mg  1 mg Oral PRN Motley-Mangrum, Spirit Wernli A, PMHNP       And   ziprasidone (GEODON) injection 20 mg  20 mg Intramuscular PRN Motley-Mangrum, Azelea Seguin A, PMHNP       paliperidone (INVEGA SUSTENNA)  injection 234 mg  234 mg Intramuscular Once Toy Cookey E, NP       paliperidone (INVEGA) 24 hr tablet 6 mg  6 mg Oral Daily Onuoha, Josephine C, NP   6 mg at 05/05/22 1840   Current Outpatient Medications  Medication Sig Dispense Refill   benztropine (COGENTIN) 1 MG tablet Take 1 tablet (1 mg total) by mouth 2 (two) times daily. 60 tablet 3   divalproex (DEPAKOTE) 500 MG DR tablet Take 1 tablet (500 mg total) by mouth every 12 (twelve) hours. 60 tablet 3   nicotine polacrilex (NICORETTE) 2 MG gum Take 1 each (2 mg total) by mouth as needed for smoking cessation. (Patient not taking: Reported on 05/05/2022) 100 tablet 3   paliperidone (INVEGA SUSTENNA) 234 MG/1.5ML injection Inject 234 mg into the muscle once for 1 dose. 1.5 mL 0   paliperidone (INVEGA)  6 MG 24 hr tablet TAKE 1 TABLET BY MOUTH DAILY (Patient not taking: Reported on 05/05/2022) 7 tablet 0   traZODone (DESYREL) 100 MG tablet Take 1 tablet (100 mg total) by mouth at bedtime as needed for sleep. 30 tablet 3    Lab Results:  Results for orders placed or performed during the hospital encounter of 05/05/22 (from the past 48 hour(s))  Rapid urine drug screen (hospital performed)     Status: Abnormal   Collection Time: 05/05/22  4:36 PM  Result Value Ref Range   Opiates NONE DETECTED NONE DETECTED   Cocaine NONE DETECTED NONE DETECTED   Benzodiazepines NONE DETECTED NONE DETECTED   Amphetamines NONE DETECTED NONE DETECTED   Tetrahydrocannabinol POSITIVE (A) NONE DETECTED   Barbiturates NONE DETECTED NONE DETECTED    Comment: (NOTE) DRUG SCREEN FOR MEDICAL PURPOSES ONLY.  IF CONFIRMATION IS NEEDED FOR ANY PURPOSE, NOTIFY LAB WITHIN 5 DAYS.  LOWEST DETECTABLE LIMITS FOR URINE DRUG SCREEN Drug Class                     Cutoff (ng/mL) Amphetamine and metabolites    1000 Barbiturate and metabolites    200 Benzodiazepine                 200 Opiates and metabolites        300 Cocaine and metabolites        300 THC                             50 Performed at [facility], [address] [place], [place] [address]   Comprehensive metabolic panel     Status: Abnormal   Collection Time: 05/05/22  4:49 PM  Result Value Ref Range   Sodium 135 135 - 145 mmol/L   Potassium 4.1 3.5 - 5.1 mmol/L   Chloride 100 98 - 111 mmol/L   CO2 24 22 - 32 mmol/L   Glucose, Bld 85 70 - 99 mg/dL    Comment: Glucose reference range applies only to samples taken after fasting for at least 8 hours.   BUN 24 (H) 6 - 20 mg/dL   Creatinine, Ser 4.03 0.61 - 1.24 mg/dL   Calcium 9.3 8.9 - 47.4 mg/dL   Total Protein 8.8 (H) 6.5 - 8.1 g/dL   Albumin 4.8 3.5 - 5.0 g/dL   AST 27 15 - 41 U/L   ALT 23 0 - 44 U/L   Alkaline Phosphatase 69 38 - 126 U/L   Total Bilirubin 0.8 0.3 - 1.2 mg/dL   GFR, Estimated >25 >95 mL/min    Comment: (NOTE) Calculated using the CKD-EPI Creatinine Equation (2021)    Anion gap 11 5 - 15    Comment: Performed at [facility], [address], [place], [place] [address]  Ethanol     Status: None   Collection Time: 05/05/22  4:49 PM  Result Value Ref Range   Alcohol, Ethyl (B) <10 <10 mg/dL    Comment: (NOTE) Lowest detectable limit for serum alcohol is 10 mg/dL.  For medical purposes only. Performed at [facility], [address], [place], [place] [address]   Salicylate level     Status: Abnormal   Collection Time: 05/05/22  4:49 PM  Result Value Ref Range   Salicylate Lvl <7.0 (L) 7.0 - 30.0 mg/dL    Comment: Performed at [facility], [address] [place], [place] [address]  Acetaminophen level     Status: Abnormal   Collection Time: 05/05/22  4:49 PM  Result Value Ref Range   Acetaminophen (Tylenol), Serum <10 (L) 10 - 30 ug/mL    Comment: (NOTE) Therapeutic concentrations vary significantly. A range of 10-30 ug/mL  may be an effective concentration for many patients. However, some  are best treated at concentrations  outside of this range. Acetaminophen concentrations >150 ug/mL at 4 hours after ingestion  and >50 ug/mL at 12 hours after ingestion are often associated with  toxic reactions.  Performed at [facility], [address], [place], [place] [address]   cbc     Status: Abnormal   Collection Time: 05/05/22  4:49 PM  Result Value Ref Range   WBC 10.8 (H) 4.0 - 10.5 K/uL   RBC 4.88 4.22 - 5.81 MIL/uL   Hemoglobin 13.7 13.0 - 17.0 g/dL   HCT 82.9 56.2 - 13.0 %   MCV 87.1 80.0 - 100.0 fL   MCH 28.1 26.0 - 34.0 pg   MCHC 32.2 30.0 - 36.0 g/dL   RDW 86.5 (H) 78.4 - 69.6 %   Platelets 300 150 - 400 K/uL   nRBC 0.0 0.0 - 0.2 %    Comment: Performed at [facility], [address], [place], [place] [address]    Blood Alcohol level:  Lab Results  Component Value Date   [facility] <10 05/05/2022   ETH <10 03/10/2022    Physical Findings:  CIWA:    COWS:     Musculoskeletal: Strength & Muscle Tone: within normal limits Gait & Station: normal Patient leans: N/A  Psychiatric Specialty Exam:  Presentation  General Appearance:  Appropriate for Environment  Eye Contact: Good  Speech: Clear and Coherent  Speech Volume: Increased  Handedness: Right   Mood and Affect  Mood: Euphoric  Affect: Congruent; Full Range   Thought Process  Thought Processes: Coherent; Disorganized  Descriptions of Associations:Intact  Orientation:Full (Time, Place and Person)  Thought Content:Illogical; Logical  History of Schizophrenia/Schizoaffective disorder:No  Duration of Psychotic Symptoms:N/A  Hallucinations:Hallucinations: None  Ideas of Reference:Delusions  Suicidal Thoughts:Suicidal Thoughts: No  Homicidal Thoughts:Homicidal Thoughts: No   Sensorium  Memory: Immediate Good; Recent Good  Judgment: Fair  Insight: Fair   Art therapist  Concentration: Good  Attention Span: Good  Recall: Good  Fund of  Knowledge: Good  Language: Good   Psychomotor Activity  Psychomotor Activity: Psychomotor Activity: Normal   Assets  Assets: Communication Skills; Housing; Social Support   Sleep  Sleep: Sleep: Fair    Physical Exam: Physical Exam Eyes:     Pupils: Pupils are equal, round, and reactive to light.  Musculoskeletal:        General: Normal range of motion.     Cervical back: Normal range of motion.  Neurological:     Mental Status: He is alert.  Psychiatric:        Attention and Perception: Attention normal.        Mood and Affect: Affect normal. Mood is anxious and elated.        Speech: Speech normal.        Behavior: Behavior is hyperactive.        Thought Content: Thought content is delusional.        Cognition and Memory: Memory normal.        Judgment: Judgment is inappropriate.    Review of Systems  Constitutional: Negative.  HENT: Negative.    Musculoskeletal: Negative.   Psychiatric/Behavioral:         Delusional    Blood pressure (!) 146/80, pulse 80, temperature 98.7 F (37.1 C), temperature source Oral, resp. rate 18, SpO2 100 %. There is no height or weight on file to calculate BMI.   Medical Decision Making: Patient case review and discussed with Dr. Lucianne Muss. Patient needs inpatient psychiatric admission for stabilization and treatment. Patient remains, manic and delusional and restless, his mother is not yet comfortable with him returning home. Patient received IM Invega Sustenna 234 mg IM  and was compliant with medications. No adverse reactions, or swelling, redness at injection site, noticed so far.     Disposition: Recommend psychiatric Inpatient admission when medically cleared.   Yaret Hush MOTLEY-MANGRUM, PMHNP 05/06/2022, 2:22 PM

## 2022-05-06 NOTE — ED Notes (Addendum)
4th call to [facility] in the last hour. No call back at this time. Left voicemail. Charge RN aware

## 2022-05-06 NOTE — ED Provider Notes (Signed)
Emergency Medicine Observation Re-evaluation Note  Derrick Joseph is a 22 y.o. male, seen on rounds today.  Pt initially presented to the ED for complaints of Manic Behavior Currently, the patient is standing in front of the window.  Hyperactive.  Physical Exam  BP (!) 143/95   Pulse 81   Temp 97.8 F (36.6 C) (Oral)   Resp 18   SpO2 100%  Physical Exam   ED Course / MDM  EKG:EKG Interpretation  Date/Time:  Wednesday May 06 2022 02:33:43 EDT Ventricular Rate:  76 PR Interval:  148 QRS Duration: 88 QT Interval:  372 QTC Calculation: 418 R Axis:   84 Text Interpretation: Normal sinus rhythm Normal ECG No significant change was found Confirmed by Molpus, John ([id number]) on 05/06/2022 2:42:55 AM  I have reviewed the labs performed to date as well as medications administered while in observation.  Recent changes in the last 24 hours include .  Plan  Current plan is for patient to receive dose of antipsychotic and be monitored.    Lorre Nick, MD 05/06/22 [phone]

## 2022-05-06 NOTE — ED Notes (Signed)
Pt is sitting at window and coloring a coloring sheet with crayons. Pt is calm and cooperative at this time.

## 2022-05-18 ENCOUNTER — Encounter (HOSPITAL_COMMUNITY): Payer: Medicaid Other | Admitting: Psychiatry

## 2022-06-09 ENCOUNTER — Ambulatory Visit (HOSPITAL_COMMUNITY): Payer: Medicaid Other

## 2022-06-18 ENCOUNTER — Ambulatory Visit (INDEPENDENT_AMBULATORY_CARE_PROVIDER_SITE_OTHER): Payer: Medicaid Other | Admitting: Psychiatry

## 2022-06-18 ENCOUNTER — Ambulatory Visit (INDEPENDENT_AMBULATORY_CARE_PROVIDER_SITE_OTHER): Payer: Medicaid Other

## 2022-06-18 ENCOUNTER — Encounter (HOSPITAL_COMMUNITY): Payer: Self-pay

## 2022-06-18 ENCOUNTER — Telehealth (HOSPITAL_COMMUNITY): Payer: Self-pay | Admitting: *Deleted

## 2022-06-18 VITALS — BP 126/76 | HR 66 | Ht 67.0 in | Wt 153.2 lb

## 2022-06-18 DIAGNOSIS — F17200 Nicotine dependence, unspecified, uncomplicated: Secondary | ICD-10-CM

## 2022-06-18 DIAGNOSIS — F411 Generalized anxiety disorder: Secondary | ICD-10-CM

## 2022-06-18 DIAGNOSIS — F1290 Cannabis use, unspecified, uncomplicated: Secondary | ICD-10-CM

## 2022-06-18 DIAGNOSIS — F200 Paranoid schizophrenia: Secondary | ICD-10-CM

## 2022-06-18 DIAGNOSIS — F3110 Bipolar disorder, current episode manic without psychotic features, unspecified: Secondary | ICD-10-CM | POA: Diagnosis not present

## 2022-06-18 DIAGNOSIS — G4700 Insomnia, unspecified: Secondary | ICD-10-CM

## 2022-06-18 MED ORDER — INVEGA SUSTENNA 156 MG/ML IM SUSY
156.0000 mg | PREFILLED_SYRINGE | INTRAMUSCULAR | 11 refills | Status: DC
Start: 2022-06-18 — End: 2022-06-22

## 2022-06-18 MED ORDER — PALIPERIDONE PALMITATE ER 156 MG/ML IM SUSY
156.0000 mg | PREFILLED_SYRINGE | Freq: Once | INTRAMUSCULAR | Status: AC
Start: 2022-06-18 — End: 2022-06-25
  Administered 2022-06-25: 156 mg via INTRAMUSCULAR

## 2022-06-18 MED ORDER — BENZTROPINE MESYLATE 1 MG PO TABS
1.0000 mg | ORAL_TABLET | Freq: Two times a day (BID) | ORAL | 3 refills | Status: DC
Start: 2022-06-18 — End: 2022-08-18

## 2022-06-18 NOTE — Telephone Encounter (Signed)
Pharmacy called to notify office that patients medication Derrick Joseph is not covered at their pharmacy. It will need to be sent to another pharmacy to be covered by his insurance.

## 2022-06-18 NOTE — Progress Notes (Signed)
BH MD/PA/NP OP Progress Note  06/18/2022 11:17 AM Derrick Joseph  MRN:  [id number]  Chief Complaint: "I feel great"   HPI: 22 year old male seen today for follow-up psychiatric evaluation.  He walked into the clinic for medication management and follow up psychiatric evaluation.  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  He is currently managed on Invega 234 (received 04/27/2022 at [facility]) where he presented requesting his injection. He is also managed on Cogentin 1 mg twice daily, Depakote 500 mg twice daily, Nicorette 2 mg gum daily, and trazodone 100 mg nightly as needed.  He informed Clinical research associate that he has only been taking his injection. Patient never received maintenance dose of Invega.   Today patient is well-groomed, pleasant, cooperative, and engaged in conversation.  He informed Clinical research associate that he feels great.  He notes that [place] helps him stay calm.  He notes that he wants to find a job however has been having difficulty.  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  Since starting Invega patient notes that he is less irritable, denies racing thoughts, paranoia, or VAH.  He also informed Clinical research associate that his anxiety and depression are well-managed.  Today provider conducted a GAD-7 and patient scored a 0.  Provider also conducted PHQ-9 patient scored a 0.  He endorses adequate sleep and appetite.  Today he denies SI/HI/VAH or paranoia.    Patient informed Clinical research associate that he continues to smoke marijuana.  He notes that he smokes 2 days out of the week.  He also notes that he vapes tobacco daily.  He denies alcohol or other illegal drug use.  Patient that marijuana can exacerbate his mental health. He endorsed understanding.  Provider spoke to patient's mother who notes that she believes he is doing well.  She notes that she works 2 jobs and notes that the patient does not stay  home as he is bored in the house without a job.  Patient notes that his brother informed him that he feels that his medication wears off closer to the end of the month as he is more talkative.  Today he denies symptoms of mania.    Patient informed Clinical research associate that when he first took [name] and had stiffening in his muscles, he reports that his tongue moved abnormally and his eyes moved abnormally.  He no longer experiences this.  Today provider conducted an aims assessment and patient scored a 0.   Patient given [name] presented 234 mg today.  He will follow-up in 1 week to get Invega 156 mg maintenance dose.  He reports that he will restart cogentin but does not wish to start his other medications.  No other concerns noted at this time.    Visit Diagnosis:    ICD-10-CM   1. Marijuana use  F12.90     2. Bipolar disorder, current episode manic without psychotic features (HCC)  F31.10 benztropine (COGENTIN) 1 MG tablet    paliperidone (INVEGA SUSTENNA) injection 156 mg    paliperidone (INVEGA SUSTENNA) 156 MG/ML SUSY injection    3. Tobacco dependence  F17.200       Past Psychiatric History:  bipolar disorder, marijuana use, SI/SA, and multiple hospitalizations  Past Medical History: No past medical history on file. No past surgical history on file.  Family Psychiatric History: Father- Unknown Diagnosis, Polysubstance Abuse Cousin- Unknown Diagnosis, hospitalized a few times Older Sister-  Bipolar Disorder  Family History: No family history on file.  Social History:  Social History   Socioeconomic History   Marital status: Single    Spouse name: Not on file   Number of children: Not on file   Years of education: Not on file   Highest education level: Not on file  Occupational History   Not on file  Tobacco Use   Smoking status: Every Day    Types: Cigarettes    Passive exposure: Current   Smokeless tobacco: Never  Vaping Use   Vaping Use: Every day   Substances: Nicotine,  Flavoring  Substance and Sexual Activity   Alcohol use: Not Currently   Drug use: Not Currently   Sexual activity: Yes    Birth control/protection: Condom  Other Topics Concern   Not on file  Social History Narrative   Not on file   Social Determinants of Health   Financial Resource Strain: Not on file  Food Insecurity: No Food Insecurity (03/09/2022)   Hunger Vital Sign    Worried About Running Out of Food in the Last Year: Never true    Ran Out of Food in the Last Year: Never true  Transportation Needs: Patient Declined (03/09/2022)   PRAPARE - Administrator, Civil Service (Medical): Patient declined    Lack of Transportation (Non-Medical): Patient declined  Physical Activity: Not on file  Stress: Not on file  Social Connections: Not on file    Allergies: No Known Allergies  Metabolic Disorder Labs: Lab Results  Component Value Date   HGBA1C 5.6 03/10/2022   MPG 114 03/10/2022   No results found for: "PROLACTIN" Lab Results  Component Value Date   CHOL 106 03/11/2022   TRIG 52 03/11/2022   HDL 38 (L) 03/11/2022   CHOLHDL 2.8 03/11/2022   VLDL 10 03/11/2022   LDLCALC 58 03/11/2022   Lab Results  Component Value Date   TSH 1.385 03/10/2022    Therapeutic Level Labs: No results found for: "LITHIUM" Lab Results  Component Value Date   VALPROATE 97 03/15/2022   No results found for: "CBMZ"  Current Medications: Current Outpatient Medications  Medication Sig Dispense Refill   paliperidone (INVEGA SUSTENNA) 156 MG/ML SUSY injection Inject 1 mL (156 mg total) into the muscle every 28 (twenty-eight) days. 1 mL 11   benztropine (COGENTIN) 1 MG tablet Take 1 tablet (1 mg total) by mouth 2 (two) times daily. 60 tablet 3   Current Facility-Administered Medications  Medication Dose Route Frequency Provider Last Rate Last Admin   paliperidone (INVEGA SUSTENNA) injection 156 mg  156 mg Intramuscular Once Shanna Cisco, NP          Musculoskeletal: Strength & Muscle Tone: within normal limits Gait & Station: normal Patient leans: N/A  Psychiatric Specialty Exam: Review of Systems  There were no vitals taken for this visit.There is no height or weight on file to calculate BMI.  General Appearance: Well Groomed  Eye Contact:  Good  Speech:  Clear and Coherent and Normal Rate  Volume:  Normal  Mood:  Euthymic  Affect:  Appropriate and Congruent  Thought Process:  Coherent, Goal Directed, and Linear  Orientation:  Full (Time, Place, and Person)  Thought Content: WDL and Logical   Suicidal Thoughts:  No  Homicidal Thoughts:  No  Memory:  Immediate;   Good Recent;   Good Remote;   Good  Judgement:  Good  Insight:  Good  Psychomotor Activity:  Normal  Concentration:  Concentration: Good and Attention Span: Good  Recall:  Good  Fund of Knowledge: Good  Language: Good  Akathisia:  No  Handed:  Right  AIMS (if indicated): not done  Assets:  Communication Skills Desire for Improvement Leisure Time Physical Health Social Support  ADL's:  Intact  Cognition: WNL  Sleep:  Good   Screenings: AIMS    Flowsheet Row Office Visit from 06/18/2022 in [facility] Office Visit from 04/27/2022 in [facility] Office Visit from 03/30/2022 in [facility]  AIMS Total Score 0 2 0      GAD-7    Flowsheet Row Office Visit from 04/27/2022 in [facility] Office Visit from 03/30/2022 in [facility]  Total GAD-7 Score 4 2      PHQ2-9    Flowsheet Row Office Visit from 04/27/2022 in [facility] Office Visit from 03/30/2022 in [facility]  PHQ-2 Total Score 0 0  PHQ-9 Total Score 0 0      Flowsheet Row ED from 05/05/2022 in [facility] Emergency Department at [facility] Most recent reading at 05/06/2022  1:57  PM ED from 05/05/2022 in [facility] Emergency Department at [facility] Most recent reading at 05/05/2022  4:14 AM Office Visit from 04/27/2022 in [facility] Most recent reading at 04/27/2022  8:37 AM  C-SSRS RISK CATEGORY No Risk No Risk Error: Q3, 4, or 5 should not be populated when Q2 is No        Assessment and Plan: Patient informed Clinical research associate that he is doing well on [name]. He reports that he experienced abnormal muscle movements when he first took [name] in April. He notes that he no longer experience these side effect. Patient given Gean Birchwood 234 mg today.  He will follow-up in 1 week to get Invega 156 mg maintenance dose.  He reports that he will restart cogentin 1 mg twice daily but does not wish to start his other medications.  1. Bipolar disorder, current episode manic without psychotic features (HCC)  Start- benztropine (COGENTIN) 1 MG tablet; Take 1 tablet (1 mg total) by mouth 2 (two) times daily.  Dispense: 60 tablet; Refill: 3 Start- paliperidone (INVEGA SUSTENNA) injection 156 mg Start- paliperidone (INVEGA SUSTENNA) 156 MG/ML SUSY injection; Inject 1 mL (156 mg total) into the muscle every 28 (twenty-eight) days.  Dispense: 1 mL; Refill: 11  2. Marijuana use   3. Tobacco dependence    Collaboration of Care: Collaboration of Care: Other provider involved in patient's care AEB PCP and shock clinic staff  Patient/Guardian was advised Release of Information must be obtained prior to any record release in order to collaborate their care with an outside provider. Patient/Guardian was advised if they have not already done so to contact the registration department to sign all necessary forms in order for [place] to release information regarding their care.   Consent: Patient/Guardian gives verbal consent for treatment and assignment of benefits for services provided during this visit. Patient/Guardian expressed understanding and agreed to  proceed.   Follow-up in 1 week with nursing staff Follow-up in 2 months for medication management Shanna Cisco, NP 06/18/2022, 11:17 AM

## 2022-06-18 NOTE — Progress Notes (Cosign Needed)
PATIENT PRESENTS TO THE OFFICE INVEGA SUSTENNA 234 INJECTION IN RIGHT ARM , TODAY WAS THE LOADING DOSE . HE WILL RETURN IN A WEEK TO GET INVEGA SUSTENNA 156 . PT TOLERATED THE INJECTION WELL WITH NO COMPLAINTS .

## 2022-06-22 ENCOUNTER — Other Ambulatory Visit (HOSPITAL_COMMUNITY): Payer: Self-pay | Admitting: Psychiatry

## 2022-06-22 DIAGNOSIS — F3110 Bipolar disorder, current episode manic without psychotic features, unspecified: Secondary | ICD-10-CM

## 2022-06-22 MED ORDER — INVEGA SUSTENNA 156 MG/ML IM SUSY
156.0000 mg | PREFILLED_SYRINGE | INTRAMUSCULAR | 11 refills | Status: DC
Start: 2022-06-22 — End: 2022-08-18

## 2022-06-22 NOTE — Telephone Encounter (Signed)
Provider called patient and was informed by his mother that [facility] reports that his Medicaid coverage will not cover Invega.  Patient mother requested that the medication to be sent to [facility].  Invega 156 mg sent to preferred pharmacy.  Patient and mother informed that if medication does not go through to notify the clinic.  Patient's mother endorsed understanding and agreed. No other concerns at this time.

## 2022-06-25 ENCOUNTER — Encounter (HOSPITAL_COMMUNITY): Payer: Self-pay

## 2022-06-25 ENCOUNTER — Ambulatory Visit (INDEPENDENT_AMBULATORY_CARE_PROVIDER_SITE_OTHER): Payer: Medicaid Other | Admitting: *Deleted

## 2022-06-25 VITALS — BP 124/84 | HR 55 | Ht 67.0 in | Wt 154.0 lb

## 2022-06-25 DIAGNOSIS — F200 Paranoid schizophrenia: Secondary | ICD-10-CM | POA: Diagnosis not present

## 2022-06-25 DIAGNOSIS — F3110 Bipolar disorder, current episode manic without psychotic features, unspecified: Secondary | ICD-10-CM

## 2022-06-25 NOTE — Progress Notes (Signed)
In early this am for his monthly inj. He is in good spirits but says he hates getting a shot. Discussed with him and Dr Doyne Keel the possibility of changing in the future to the [name] S 3 months shot due to him not liking to get a shot.He denies any psych issues. He has a good personal appearance. States he has two new jobs, one at [employer] which he starts today. He is to return in 28 days for his next shot.

## 2022-07-30 ENCOUNTER — Ambulatory Visit (HOSPITAL_COMMUNITY): Payer: MEDICAID

## 2022-08-04 ENCOUNTER — Ambulatory Visit (HOSPITAL_COMMUNITY): Payer: MEDICAID

## 2022-08-18 ENCOUNTER — Encounter (HOSPITAL_COMMUNITY): Payer: Self-pay

## 2022-08-18 ENCOUNTER — Other Ambulatory Visit (HOSPITAL_COMMUNITY): Payer: Self-pay | Admitting: Psychiatry

## 2022-08-18 ENCOUNTER — Ambulatory Visit (INDEPENDENT_AMBULATORY_CARE_PROVIDER_SITE_OTHER): Payer: 59 | Admitting: Psychiatry

## 2022-08-18 ENCOUNTER — Ambulatory Visit (INDEPENDENT_AMBULATORY_CARE_PROVIDER_SITE_OTHER): Payer: 59

## 2022-08-18 VITALS — BP 134/79 | HR 66 | Temp 97.8°F | Wt 156.6 lb

## 2022-08-18 DIAGNOSIS — F1290 Cannabis use, unspecified, uncomplicated: Secondary | ICD-10-CM

## 2022-08-18 DIAGNOSIS — F3110 Bipolar disorder, current episode manic without psychotic features, unspecified: Secondary | ICD-10-CM

## 2022-08-18 MED ORDER — INVEGA SUSTENNA 156 MG/ML IM SUSY: 156.0000 mg | PREFILLED_SYRINGE | INTRAMUSCULAR | 11 refills | Status: DC

## 2022-08-18 MED ORDER — PALIPERIDONE PALMITATE ER 156 MG/ML IM SUSY
156.0000 mg | PREFILLED_SYRINGE | Freq: Once | INTRAMUSCULAR | Status: AC
  Administered 2022-08-18: 156 mg via INTRAMUSCULAR

## 2022-08-18 NOTE — Progress Notes (Signed)
Patient presents to the office for [facility] injection 156. Pt tolerated well and is to return in 28 days for next injection .

## 2022-08-18 NOTE — Progress Notes (Signed)
BH MD/PA/NP OP Progress Note  08/18/2022 9:07 AM Derrick Joseph  MRN:  [id number]  Chief Complaint: "The [name] works well"   HPI: 68 22 year old male seen today for follow-up psychiatric evaluation.  He walked into the clinic for medication management and follow up psychiatric evaluation.  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  He is currently managed on Invega 156 mg monthly and Cogentin 1 mg twice daily. Patient notes that he has not taken his cogentin. He reports that [name] is effective in managing her psychiatric condition.   Today patient is well-groomed, pleasant, cooperative, and engaged in conversation.  He informed Clinical research associate that [name] works well.  He notes that his mood continues to be stable and denies symptoms of anxiety and depression.  Today provider conducted a GAD-7 and patient scored a 0, at his last visit he scored a 0.  Provider also conducted PHQ-9 and patient scored a 0 at his last visit he scored a 0.  He endorses adequate sleep and appetite.  Today he denies SI/HI/VAH, mania, paranoia.     Patient informed Clinical research associate that for the last few months he has gotten a job.  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  He notes that he finds enjoyment in his job.     Patient informed Clinical research associate that he continues to smoke marijuana.  He notes that he has cut his consumption to every other day.  Provider informed patient that marijuana can exacerbate his mental health.  He endorsed understanding however notes that it makes him feel calm.     Today provider conducted an aims assessment and patient scored a 0.     At this time Cogentin not restarted.  Patient will continue Invega as prescribed.  No other concerns at this time.   Visit Diagnosis:    ICD-10-CM   1. Bipolar disorder, current episode manic without psychotic features (HCC)  F31.10 paliperidone (INVEGA SUSTENNA) 156 MG/ML SUSY injection    2. Marijuana use  F12.90         Past Psychiatric History:  bipolar disorder, marijuana use, SI/SA, and multiple hospitalizations  Past Medical History: History reviewed. No pertinent past medical history. History reviewed. No pertinent surgical history.  Family Psychiatric History: Father- Unknown Diagnosis, Polysubstance Abuse Cousin- Unknown Diagnosis, hospitalized a few times Older Sister- Bipolar Disorder  Family History: History reviewed. No pertinent family history.  Social History:  Social History   Socioeconomic History   Marital status: Single    Spouse name: Not on file   Number of children: Not on file   Years of education: Not on file   Highest education level: Not on file  Occupational History   Not on file  Tobacco Use   Smoking status: Every Day    Types: Cigarettes    Passive exposure: Current   Smokeless tobacco: Never  Vaping Use   Vaping status: Every Day   Substances: Nicotine, Flavoring  Substance and Sexual Activity   Alcohol use: Not Currently   Drug use: Not Currently   Sexual activity: Yes    Birth control/protection: Condom  Other Topics Concern   Not on file  Social History Narrative   Not on file   Social Determinants of Health   Financial Resource Strain: Not on file  Food Insecurity: No Food Insecurity (03/09/2022)   Hunger Vital Sign    Worried About Running Out of Food in the Last Year: Never  true    Ran Out of Food in the Last Year: Never true  Transportation Needs: Patient Declined (03/09/2022)   PRAPARE - Administrator, Civil Service (Medical): Patient declined    Lack of Transportation (Non-Medical): Patient declined  Physical Activity: Not on file  Stress: Not on file  Social Connections: Not on file    Allergies: No Known Allergies  Metabolic Disorder Labs: Lab Results  Component Value Date   HGBA1C 5.6 03/10/2022   MPG 114 03/10/2022   No results found for: "PROLACTIN" Lab Results  Component Value Date   CHOL 106 03/11/2022    TRIG 52 03/11/2022   HDL 38 (L) 03/11/2022   CHOLHDL 2.8 03/11/2022   VLDL 10 03/11/2022   LDLCALC 58 03/11/2022   Lab Results  Component Value Date   TSH 1.385 03/10/2022    Therapeutic Level Labs: No results found for: "LITHIUM" Lab Results  Component Value Date   VALPROATE 97 03/15/2022   No results found for: "CBMZ"  Current Medications: Current Outpatient Medications  Medication Sig Dispense Refill   paliperidone (INVEGA SUSTENNA) 156 MG/ML SUSY injection Inject 1 mL (156 mg total) into the muscle every 28 (twenty-eight) days. 1 mL 11   No current facility-administered medications for this visit.     Musculoskeletal: Strength & Muscle Tone: within normal limits Gait & Station: normal Patient leans: N/A  Psychiatric Specialty Exam: Review of Systems  There were no vitals taken for this visit.There is no height or weight on file to calculate BMI.  General Appearance: Well Groomed  Eye Contact:  Good  Speech:  Clear and Coherent and Normal Rate  Volume:  Normal  Mood:  Euthymic  Affect:  Appropriate and Congruent  Thought Process:  Coherent, Goal Directed, and Linear  Orientation:  Full (Time, Place, and Person)  Thought Content: WDL and Logical   Suicidal Thoughts:  No  Homicidal Thoughts:  No  Memory:  Immediate;   Good Recent;   Good Remote;   Good  Judgement:  Good  Insight:  Good  Psychomotor Activity:  Normal  Concentration:  Concentration: Good and Attention Span: Good  Recall:  Good  Fund of Knowledge: Good  Language: Good  Akathisia:  No  Handed:  Right  AIMS (if indicated): not done  Assets:  Communication Skills Desire for Improvement Leisure Time Physical Health Social Support  ADL's:  Intact  Cognition: WNL  Sleep:  Good   Screenings: AIMS    Flowsheet Row Clinical Support from 08/18/2022 in [facility] Office Visit from 06/18/2022 in [facility] Office Visit from  04/27/2022 in [facility] Office Visit from 03/30/2022 in [facility]  AIMS Total Score 0 0 2 0      GAD-7    Flowsheet Row Clinical Support from 08/18/2022 in [facility] Office Visit from 04/27/2022 in [facility] Office Visit from 03/30/2022 in [facility]  Total GAD-7 Score 0 4 2      PHQ2-9    Flowsheet Row Clinical Support from 08/18/2022 in [facility] Office Visit from 04/27/2022 in [facility] Office Visit from 03/30/2022 in [facility]  PHQ-2 Total Score 0 0 0  PHQ-9 Total Score 0 0 0      Flowsheet Row ED from 05/05/2022 in [facility] Emergency Department at [facility] Most  recent reading at 05/06/2022  1:57 PM ED from 05/05/2022 in [facility] Emergency Department at [facility] Most recent reading at 05/05/2022  4:14 AM Office Visit from 04/27/2022 in [facility] Most recent reading at 04/27/2022  8:37 AM  C-SSRS RISK CATEGORY No Risk No Risk Error: Q3, 4, or 5 should not be populated when Q2 is No        Assessment and Plan:  Patient informed Clinical research associate that he is doing well on [name].He has not been taking cogentin and at this time Cogentin not restarted   1. Bipolar disorder, current episode manic without psychotic features (HCC)   Continue- paliperidone (INVEGA SUSTENNA) 156 MG/ML SUSY injection; Inject 1 mL (156 mg total) into the muscle every 28 (twenty-eight) days.  Dispense: 1 mL; Refill: 11   2. Marijuana use           Collaboration of Care: Collaboration of Care: Other provider involved in patient's care AEB PCP and shock clinic staff   Patient/Guardian was advised Release of Information must be obtained prior to any record release in order to collaborate their care with an outside provider.  Patient/Guardian was advised if they have not already done so to contact the registration department to sign all necessary forms in order for [place] to release information regarding their care.    Consent: Patient/Guardian gives verbal consent for treatment and assignment of benefits for services provided during this visit. Patient/Guardian expressed understanding and agreed to proceed.    Follow-up in 1 week with nursing staff Follow-up in 3 months for medication management      Follow-up in 1 week with nursing staff Follow-up in 2 months for medication management Shanna Cisco, NP 08/18/2022, 9:07 AM

## 2022-08-18 NOTE — Progress Notes (Signed)
BH MD/PA/NP OP Progress Note  08/18/2022 8:58 AM Derrick Joseph  MRN:  [id number]  Chief Complaint: "Derrick Joseph works well"   HPI: 22 year old male seen today for follow-up psychiatric evaluation.  He walked into the clinic for medication management and follow up psychiatric evaluation.  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  He is currently managed on Invega 156 mg monthly and Cogentin 1 mg twice daily. Patient notes that he has not taken his cogentin. He reports that Derrick Joseph is effective in managing her psychiatric condition.  Today patient is well-groomed, pleasant, cooperative, and engaged in conversation.  He informed Clinical research associate that [name] works well.  He notes that his mood continues to be stable and denies symptoms of anxiety and depression.  Today provider conducted a GAD-7 and patient scored a 0, at his last visit he scored a 0.  Provider also conducted PHQ-9 and patient scored a 0 at his last visit he scored a 0.  He endorses adequate sleep and appetite.  Today he denies SI/HI/VAH, mania, paranoia.    Patient informed Clinical research associate that for the last few months he has gotten a job.  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  He notes that he finds enjoyment in his job.    Patient informed Clinical research associate that he continues to smoke marijuana.  He notes that he has cut his consumption to every other day.  Provider informed patient that marijuana can exacerbate his mental health.  He endorsed understanding however notes that it makes him feel calm.    Today provider conducted an aims assessment and patient scored a 0.    At this time Cogentin not restarted.  Patient will continue Invega as prescribed.  No other concerns at this time.     Visit Diagnosis:    ICD-10-CM   1. Bipolar disorder, current episode manic without psychotic features (HCC)  F31.10 paliperidone (INVEGA SUSTENNA) 156 MG/ML SUSY injection    2. Marijuana use  F12.90         Past Psychiatric History:  bipolar disorder, marijuana use, SI/SA, and multiple hospitalizations  Past Medical History: History reviewed. No pertinent past medical history. History reviewed. No pertinent surgical history.  Family Psychiatric History: Father- Unknown Diagnosis, Polysubstance Abuse Cousin- Unknown Diagnosis, hospitalized a few times Older Sister- Bipolar Disorder  Family History: History reviewed. No pertinent family history.  Social History:  Social History   Socioeconomic History   Marital status: Single    Spouse name: Not on file   Number of children: Not on file   Years of education: Not on file   Highest education level: Not on file  Occupational History   Not on file  Tobacco Use   Smoking status: Every Day    Types: Cigarettes    Passive exposure: Current   Smokeless tobacco: Never  Vaping Use   Vaping status: Every Day   Substances: Nicotine, Flavoring  Substance and Sexual Activity   Alcohol use: Not Currently   Drug use: Not Currently   Sexual activity: Yes    Birth control/protection: Condom  Other Topics Concern   Not on file  Social History Narrative   Not on file   Social Determinants of Health   Financial Resource Strain: Not on file  Food Insecurity: No Food Insecurity (03/09/2022)   Hunger Vital Sign    Worried About Running Out of Food in the Last Year: Never true    Ran  Out of Food in the Last Year: Never true  Transportation Needs: Patient Declined (03/09/2022)   PRAPARE - Administrator, Civil Service (Medical): Patient declined    Lack of Transportation (Non-Medical): Patient declined  Physical Activity: Not on file  Stress: Not on file  Social Connections: Not on file    Allergies: No Known Allergies  Metabolic Disorder Labs: Lab Results  Component Value Date   HGBA1C 5.6 03/10/2022   MPG 114 03/10/2022   No results found for: "PROLACTIN" Lab Results  Component Value Date   CHOL 106 03/11/2022    TRIG 52 03/11/2022   HDL 38 (L) 03/11/2022   CHOLHDL 2.8 03/11/2022   VLDL 10 03/11/2022   LDLCALC 58 03/11/2022   Lab Results  Component Value Date   TSH 1.385 03/10/2022    Therapeutic Level Labs: No results found for: "LITHIUM" Lab Results  Component Value Date   VALPROATE 97 03/15/2022   No results found for: "CBMZ"  Current Medications: Current Outpatient Medications  Medication Sig Dispense Refill   paliperidone (INVEGA SUSTENNA) 156 MG/ML SUSY injection Inject 1 mL (156 mg total) into the muscle every 28 (twenty-eight) days. 1 mL 11   No current facility-administered medications for this visit.     Musculoskeletal: Strength & Muscle Tone: within normal limits Gait & Station: normal Patient leans: N/A  Psychiatric Specialty Exam: Review of Systems  There were no vitals taken for this visit.There is no height or weight on file to calculate BMI.  General Appearance: Well Groomed  Eye Contact:  Good  Speech:  Clear and Coherent and Normal Rate  Volume:  Normal  Mood:  Euthymic  Affect:  Appropriate and Congruent  Thought Process:  Coherent, Goal Directed, and Linear  Orientation:  Full (Time, Place, and Person)  Thought Content: WDL and Logical   Suicidal Thoughts:  No  Homicidal Thoughts:  No  Memory:  Immediate;   Good Recent;   Good Remote;   Good  Judgement:  Good  Insight:  Good  Psychomotor Activity:  Normal  Concentration:  Concentration: Good and Attention Span: Good  Recall:  Good  Fund of Knowledge: Good  Language: Good  Akathisia:  No  Handed:  Right  AIMS (if indicated): not done  Assets:  Communication Skills Desire for Improvement Leisure Time Physical Health Social Support  ADL's:  Intact  Cognition: WNL  Sleep:  Good   Screenings: AIMS    Flowsheet Row Office Visit from 06/18/2022 in [facility] Office Visit from 04/27/2022 in [facility] Office Visit from 03/30/2022 in  [facility]  AIMS Total Score 0 2 0      GAD-7    Flowsheet Row Office Visit from 04/27/2022 in [facility] Office Visit from 03/30/2022 in [facility]  Total GAD-7 Score 4 2      PHQ2-9    Flowsheet Row Office Visit from 04/27/2022 in [facility] Office Visit from 03/30/2022 in [facility]  PHQ-2 Total Score 0 0  PHQ-9 Total Score 0 0      Flowsheet Row ED from 05/05/2022 in [facility] Emergency Department at [facility] Most recent reading at 05/06/2022  1:57 PM ED from 05/05/2022 in [facility] Emergency Department at [facility] Most recent reading at 05/05/2022  4:14 AM Office Visit from 04/27/2022 in [facility] Most recent  reading at 04/27/2022  8:37 AM  C-SSRS RISK CATEGORY No Risk No Risk Error: Q3, 4, or 5 should not be populated when Q2 is No        Assessment and Plan: Patient informed Clinical research associate that he is doing well on [name].He has not been taking cogentin and at this time Cogentin not restarted  1. Bipolar disorder, current episode manic without psychotic features (HCC)  Continue- paliperidone (INVEGA SUSTENNA) 156 MG/ML SUSY injection; Inject 1 mL (156 mg total) into the muscle every 28 (twenty-eight) days.  Dispense: 1 mL; Refill: 11  2. Marijuana use      Collaboration of Care: Collaboration of Care: Other provider involved in patient's care AEB PCP and shock clinic staff  Patient/Guardian was advised Release of Information must be obtained prior to any record release in order to collaborate their care with an outside provider. Patient/Guardian was advised if they have not already done so to contact the registration department to sign all necessary forms in order for [place] to release information regarding their care.   Consent: Patient/Guardian gives verbal consent for treatment  and assignment of benefits for services provided during this visit. Patient/Guardian expressed understanding and agreed to proceed.   Follow-up in 1 week with nursing staff Follow-up in 3 months for medication management Shanna Cisco, NP 08/18/2022, 8:58 AM

## 2022-09-17 ENCOUNTER — Encounter (HOSPITAL_COMMUNITY): Payer: Self-pay

## 2022-09-17 ENCOUNTER — Ambulatory Visit (INDEPENDENT_AMBULATORY_CARE_PROVIDER_SITE_OTHER): Payer: 59

## 2022-09-17 VITALS — BP 135/74 | HR 62 | Ht 67.0 in | Wt 159.2 lb

## 2022-09-17 DIAGNOSIS — G4700 Insomnia, unspecified: Secondary | ICD-10-CM

## 2022-09-17 DIAGNOSIS — F200 Paranoid schizophrenia: Secondary | ICD-10-CM

## 2022-09-17 DIAGNOSIS — F411 Generalized anxiety disorder: Secondary | ICD-10-CM

## 2022-09-17 MED ORDER — PALIPERIDONE PALMITATE ER 156 MG/ML IM SUSY
156.0000 mg | PREFILLED_SYRINGE | Freq: Once | INTRAMUSCULAR | Status: AC
  Administered 2022-09-17: 156 mg via INTRAMUSCULAR

## 2022-09-17 NOTE — Progress Notes (Cosign Needed)
Patient presents to the office for [facility] injection 156. Pt tolerated well in LEFT Deltoid and is to return in 28 days for next injection

## 2022-09-25 ENCOUNTER — Ambulatory Visit (HOSPITAL_COMMUNITY): Payer: No Typology Code available for payment source | Admitting: Psychiatry

## 2022-10-15 ENCOUNTER — Ambulatory Visit (HOSPITAL_COMMUNITY): Payer: 59

## 2022-10-16 ENCOUNTER — Telehealth (HOSPITAL_COMMUNITY): Payer: Self-pay | Admitting: *Deleted

## 2022-10-16 NOTE — Telephone Encounter (Signed)
Looked on cover my meds for authorization of Invega Sustenna 156mg . Approved until 10/14/2023. Called to notify pharmacy.

## 2022-10-20 NOTE — Telephone Encounter (Signed)
Thanks for the update

## 2022-11-12 ENCOUNTER — Ambulatory Visit (HOSPITAL_COMMUNITY): Payer: 59
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
## 2023-06-30 IMAGING — CT CT HEAD W/O CM
3 series · 14 of 47 positions shown, 16 images · non-contrast
Comparison: None.

CLINICAL DATA: Patient extremely manic and refusing to change into
purple scrubs at this time. IM Geodon given with security and [REDACTED] at
bedside with no issue. Pt then changed into purple scrubs,wanded by
security, and belongings inventoried.

EXAM:
CT HEAD WITHOUT CONTRAST
TECHNIQUE: Contiguous axial images were obtained from the base of the skull
through the vertex without intravenous contrast.

[Series 4: head 5.0 h30s · axial · 0.43mm/px · z∈[+965,+1095]mm · 8 of 32 slices shown, 10 images]
[im 3/32  brain]
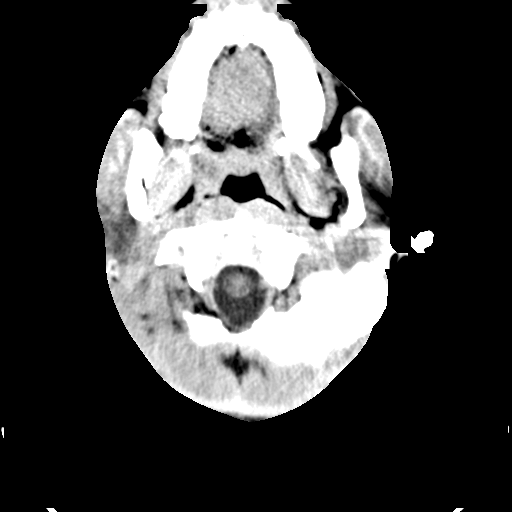
[im 3/32  bone]
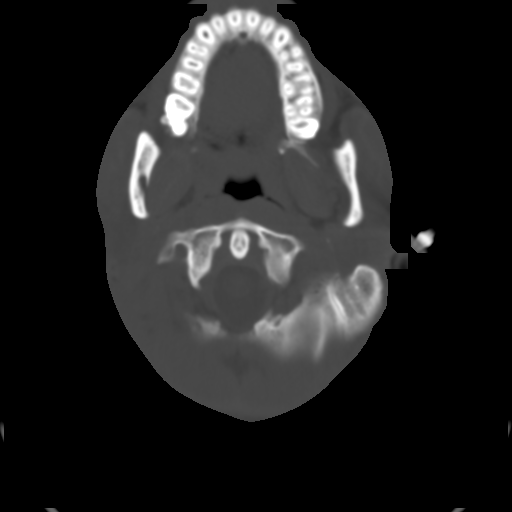
[im 7/32  brain]
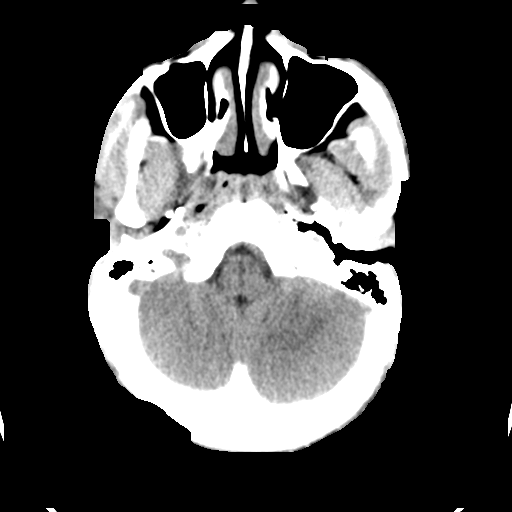
[im 10/32  brain]
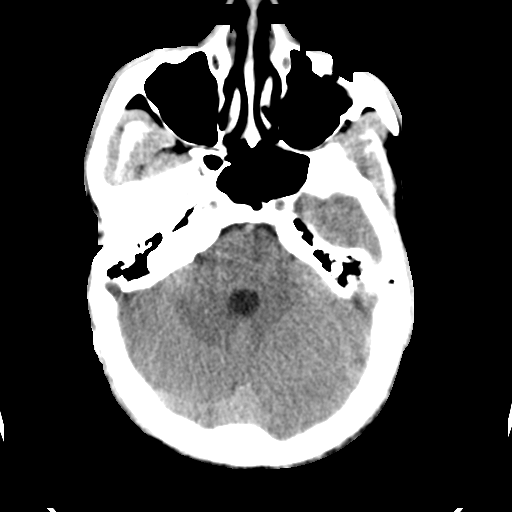
[im 14/32  brain]
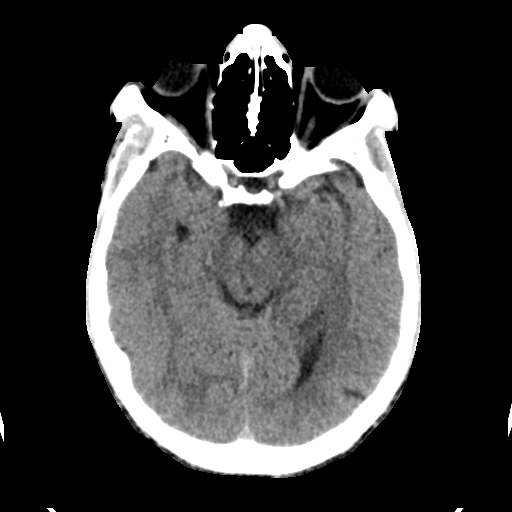
[im 18/32  brain]
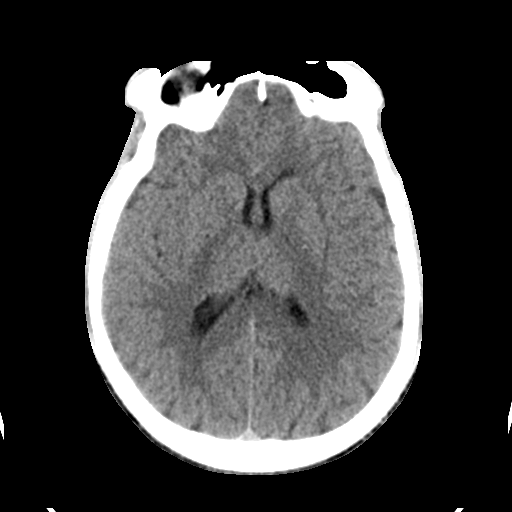
[im 18/32  bone]
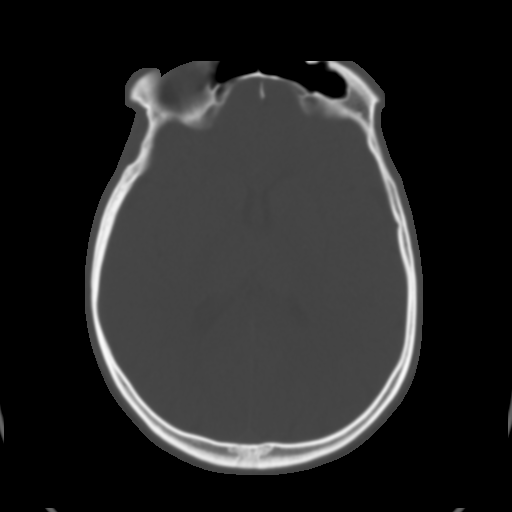
[im 22/32  brain]
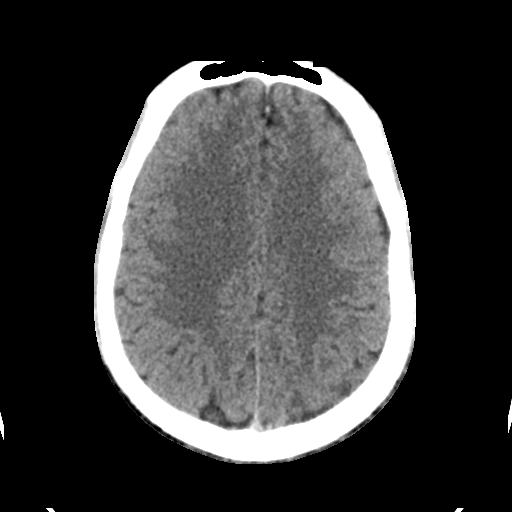
[im 25/32  brain]
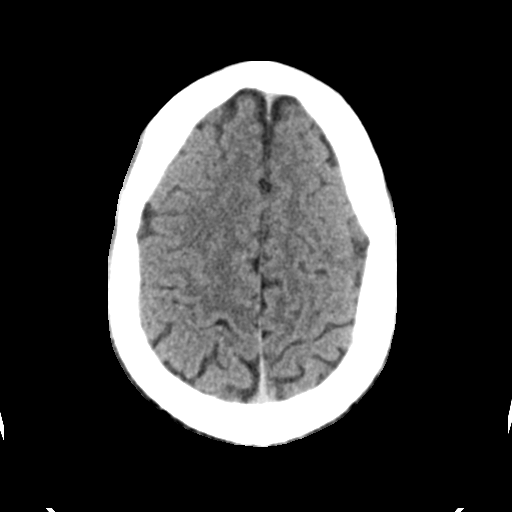
[im 29/32  brain]
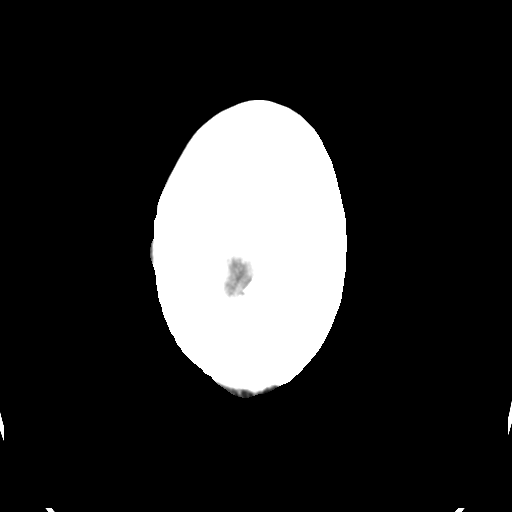

[Series 5: head 3.0 mpr cor · coronal · 0.28mm/px · 3 of 69 slices shown]
[im 24/69  brain]
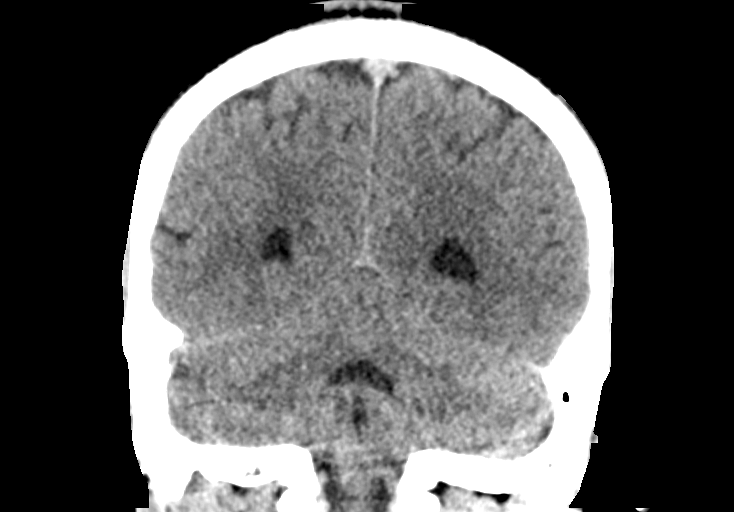
[im 31/69  brain]
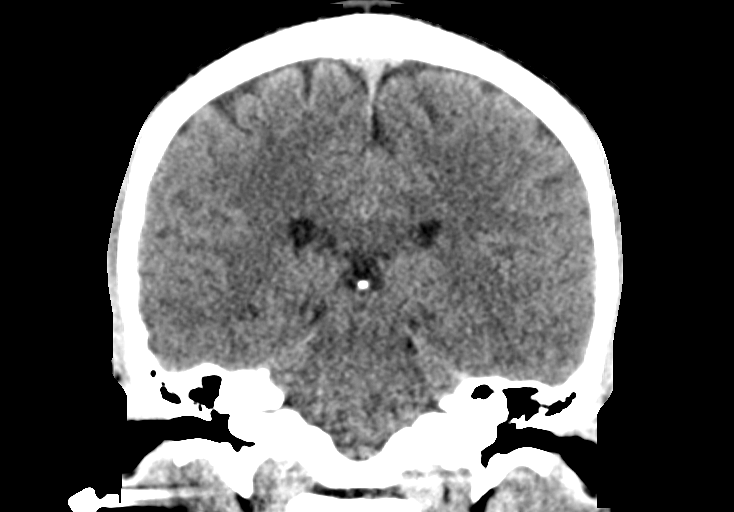
[im 38/69  brain]
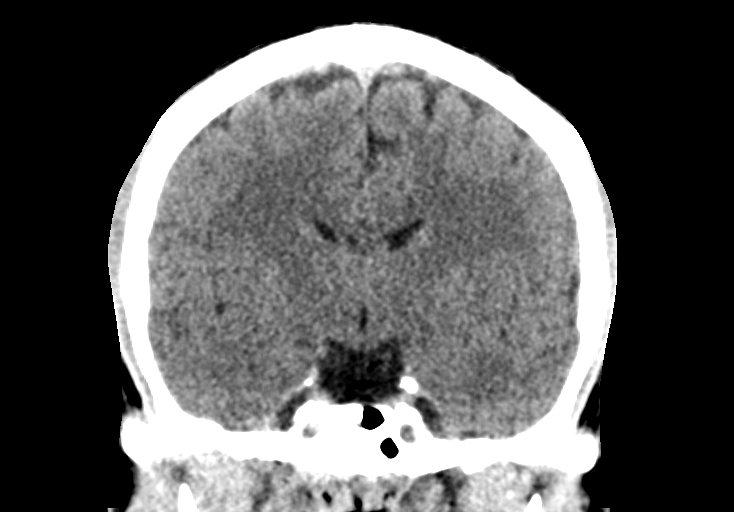

[Series 6: head 3.0 mpr sag · sagittal · 0.30mm/px · 3 of 67 slices shown]
[im 23/67  brain]
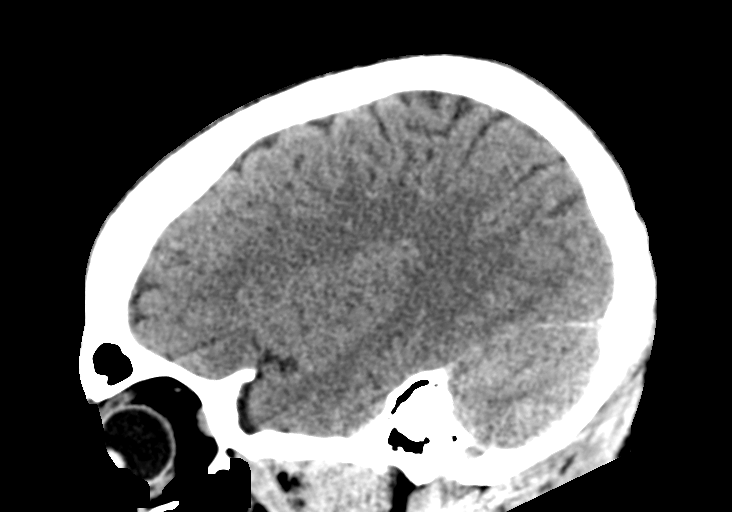
[im 34/67  brain]
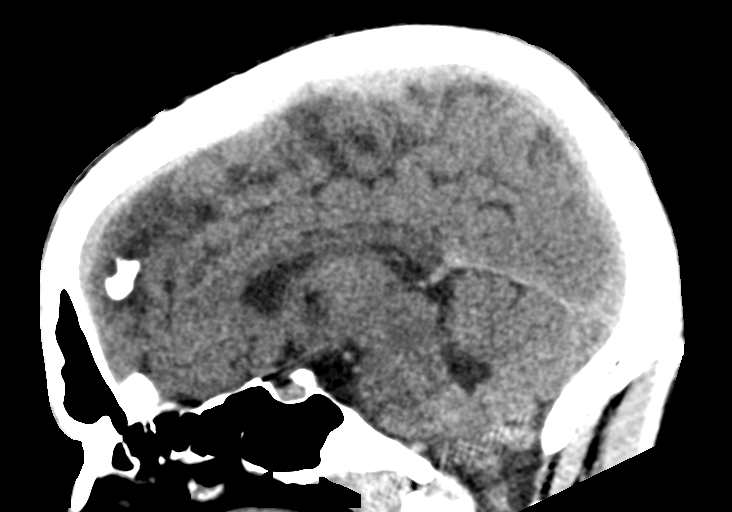
[im 45/67  brain]
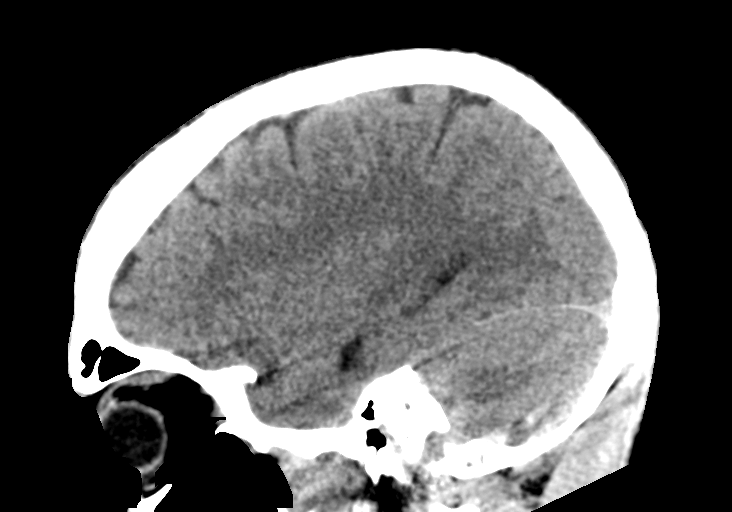

[14 of 47 positions shown; findings below may reference images not displayed]

FINDINGS: Brain:

No evidence of large-territorial acute infarction. No parenchymal
hemorrhage. No mass lesion. No extra-axial collection.

No mass effect or midline shift. No hydrocephalus. Basilar cisterns
are patent.

Vascular: No hyperdense vessel.

Skull: No acute fracture or focal lesion.

Sinuses/Orbits: Paranasal sinuses and mastoid air cells are clear.
The orbits are unremarkable.

Other: None.
IMPRESSION: No acute intracranial abnormality.

## 2023-07-07 ENCOUNTER — Other Ambulatory Visit: Payer: Self-pay

## 2023-07-07 ENCOUNTER — Emergency Department
Admission: EM | Admit: 2023-07-07 | Discharge: 2023-07-07 | Disposition: A | Discharge status: Home / Self Care | Payer: MEDICAID | Attending: Emergency Medicine | Admitting: Emergency Medicine

## 2023-07-07 DIAGNOSIS — R369 Urethral discharge, unspecified: Secondary | ICD-10-CM | POA: Insufficient documentation

## 2023-07-07 DIAGNOSIS — Z711 Person with feared health complaint in whom no diagnosis is made: Secondary | ICD-10-CM

## 2023-07-07 LAB — CHLAMYDIA/NGC RT PCR (ARMC ONLY)
Chlamydia Tr: DETECTED — AB
N gonorrhoeae: DETECTED — AB

## 2023-07-07 MED ORDER — DOXYCYCLINE HYCLATE 100 MG PO TABS
100.0000 mg | ORAL_TABLET | Freq: Once | ORAL | Status: AC
  Administered 2023-07-07: 100 mg via ORAL
  Filled 2023-07-07: qty 1

## 2023-07-07 MED ORDER — LIDOCAINE HCL (PF) 1 % IJ SOLN
1.0000 mL | Freq: Once | INTRAMUSCULAR | Status: AC
  Administered 2023-07-07: 1 mL
  Filled 2023-07-07: qty 5

## 2023-07-07 MED ORDER — DOXYCYCLINE HYCLATE 100 MG PO CAPS: 100.0000 mg | ORAL_CAPSULE | Freq: Two times a day (BID) | ORAL | 0 refills | Status: AC

## 2023-07-07 MED ORDER — CEFTRIAXONE SODIUM 1 G IJ SOLR
500.0000 mg | Freq: Once | INTRAMUSCULAR | Status: AC
  Administered 2023-07-07: 500 mg via INTRAMUSCULAR
  Filled 2023-07-07: qty 10

## 2023-07-07 NOTE — Discharge Instructions (Signed)
 You have been seen today in the Emergency Department (ED) for penile discharge.  We treated you with antibiotics, and you need to take your full week of antibiotics to be fully treated.  Please review your results on MyChart to see if the test were positive, but it is still important to complete your treatment.  Please have your partner tested for STD's and do not resume sexual activity until you and your partner have confirmed negative test results or have both been treated.  Please follow up with your doctor as soon as possible regarding today's ED visit and your symptoms.   Return to the ED if your pain worsens, you develop a fever, or for any other symptoms that concern you.

## 2023-07-07 NOTE — ED Triage Notes (Signed)
 Pt reports he is here for STD check, pt reports he feels different. Pt does not elaborate but denies penile discharge.

## 2023-07-07 NOTE — ED Provider Notes (Signed)
 [facility] Provider Note    Event Date/Time   First MD Initiated Contact with Patient 07/07/23 0500     (approximate)   History   Exposure to STD   HPI Derrick Joseph is a 23 y.o. male who presents for evaluation of possible STI exposure.  He reports that he had unprotected sexual intercourse with a new partner that he does not know very well several days ago.  Since that time he states my junk has felt weird.  He said that he has been having milky white discharge from the tip of his penis.  No pain when he urinates.  No sores or swelling.     Physical Exam   Triage Vital Signs: ED Triage Vitals  Encounter Vitals Group     BP 07/07/23 0458 (!) 127/92     Girls Systolic BP Percentile --      Girls Diastolic BP Percentile --      Boys Systolic BP Percentile --      Boys Diastolic BP Percentile --      Pulse Rate 07/07/23 0458 77     Resp 07/07/23 0458 18     Temp 07/07/23 0458 98.5 F (36.9 C)     Temp src --      SpO2 07/07/23 0458 100 %     Weight 07/07/23 0457 68 kg (150 lb)     Height 07/07/23 0457 1.702 m (5' 7)     Head Circumference --      Peak Flow --      Pain Score 07/07/23 0457 0     Pain Loc --      Pain Education --      Exclude from Growth Chart --     Most recent vital signs: Vitals:   07/07/23 0458  BP: (!) 127/92  Pulse: 77  Resp: 18  Temp: 98.5 F (36.9 C)  SpO2: 100%    General: Awake, no distress.  CV:  Good peripheral perfusion.  Resp:  Normal effort. Speaking easily and comfortably, no accessory muscle usage nor intercostal retractions.   Abd:  No distention.  Other:  Normal external circumcised male genitalia with a small amount of milky discharge at the urethral meatus.  No sores, no scrotal or penile edema.  No tenderness to palpation.  No inguinal lymphadenopathy.   ED Results / Procedures / Treatments   Labs (all labs ordered are listed, but only abnormal results are displayed) Labs Reviewed   CHLAMYDIA/NGC RT PCR [facility] ONLY)               IMPRESSION / MDM / ASSESSMENT AND PLAN / ED COURSE  I reviewed the triage vital signs and the nursing notes.                              Differential diagnosis includes, but is not limited to, STI exposure.  Patient's presentation is most consistent with acute complicated illness / injury requiring diagnostic workup.  Labs/studies ordered: Chlamydia/gonorrhea  Interventions/Medications given:  Medications  cefTRIAXone (ROCEPHIN) injection 500 mg (has no administration in time range)  lidocaine (PF) (XYLOCAINE) 1 % injection 1-2.1 mL (has no administration in time range)  doxycycline (VIBRA-TABS) tablet 100 mg (has no administration in time range)    (Note:  hospital course my include additional interventions and/or labs/studies not listed above.)  High degree of suspicion for infection given the patient's milky urethral discharge and  recent exposure.  Exposure was recent enough that there is no point for an HIV screening.  I talked with the patient and he agrees with empiric treatment with medications as listed above.  I encouraged him to check MyChart for the results and to follow-up with the [facility] department.  He understands and agrees with the plan.      FINAL CLINICAL IMPRESSION(S) / ED DIAGNOSES   Final diagnoses:  Concern about STD in male without diagnosis  Penile discharge     Rx / DC Orders   ED Discharge Orders          Ordered    doxycycline (VIBRAMYCIN) 100 MG capsule  2 times daily        07/07/23 0524             Note:  This document was prepared using Dragon voice recognition software and may include unintentional dictation errors.   Gordan Huxley, MD 07/07/23 [phone]

## 2023-10-11 ENCOUNTER — Telehealth (HOSPITAL_COMMUNITY): Payer: Self-pay

## 2023-10-11 NOTE — Telephone Encounter (Signed)
 received fax requesting a prior auth for invega  sustenna 156mg /ml

## 2023-10-11 NOTE — Telephone Encounter (Signed)
 went online to [email or web address] and submitted the prior auth . - pending

## 2024-01-17 ENCOUNTER — Encounter (HOSPITAL_COMMUNITY): Payer: Self-pay | Admitting: Emergency Medicine

## 2024-01-17 ENCOUNTER — Other Ambulatory Visit: Payer: Self-pay

## 2024-01-17 ENCOUNTER — Emergency Department (HOSPITAL_COMMUNITY)
Admission: EM | Admit: 2024-01-17 | Discharge: 2024-01-18 | Disposition: A | Payer: MEDICAID | Attending: Emergency Medicine | Admitting: Emergency Medicine

## 2024-01-17 DIAGNOSIS — F3111 Bipolar disorder, current episode manic without psychotic features, mild: Secondary | ICD-10-CM | POA: Diagnosis present

## 2024-01-17 DIAGNOSIS — F3110 Bipolar disorder, current episode manic without psychotic features, unspecified: Secondary | ICD-10-CM | POA: Diagnosis present

## 2024-01-17 DIAGNOSIS — F319 Bipolar disorder, unspecified: Secondary | ICD-10-CM | POA: Diagnosis not present

## 2024-01-17 LAB — SALICYLATE LEVEL: Salicylate Lvl: <7 mg/dL — ABNORMAL LOW (ref 7.0–30.0)

## 2024-01-17 LAB — COMPREHENSIVE METABOLIC PANEL WITH GFR
ALT: 26 U/L (ref 0–44)
AST: 42 U/L — ABNORMAL HIGH (ref 15–41)
Albumin: 4.8 g/dL (ref 3.5–5.0)
Alkaline Phosphatase: 76 U/L (ref 38–126)
Anion gap: 13 (ref 5–15)
BUN: 23 mg/dL — ABNORMAL HIGH (ref 6–20)
CO2: 25 mmol/L (ref 22–32)
Calcium: 9.6 mg/dL (ref 8.9–10.3)
Chloride: 102 mmol/L (ref 98–111)
Creatinine, Ser: 1.08 mg/dL (ref 0.61–1.24)
GFR, Estimated: >60 mL/min
Glucose, Bld: 113 mg/dL — ABNORMAL HIGH (ref 70–99)
Potassium: 4 mmol/L (ref 3.5–5.1)
Sodium: 139 mmol/L (ref 135–145)
Total Bilirubin: 0.6 mg/dL (ref 0.0–1.2)
Total Protein: 7.9 g/dL (ref 6.5–8.1)

## 2024-01-17 LAB — CBC WITH DIFFERENTIAL/PLATELET
Abs Immature Granulocytes: 0.03 K/uL (ref 0.00–0.07)
Basophils Absolute: 0 K/uL (ref 0.0–0.1)
Basophils Relative: 0 %
Eosinophils Absolute: 0.1 K/uL (ref 0.0–0.5)
Eosinophils Relative: 1 %
HCT: 43.5 % (ref 39.0–52.0)
Hemoglobin: 14.3 g/dL (ref 13.0–17.0)
Immature Granulocytes: 0 %
Lymphocytes Relative: 23 %
Lymphs Abs: 2.1 K/uL (ref 0.7–4.0)
MCH: 28.2 pg (ref 26.0–34.0)
MCHC: 32.9 g/dL (ref 30.0–36.0)
MCV: 85.8 fL (ref 80.0–100.0)
Monocytes Absolute: 0.8 K/uL (ref 0.1–1.0)
Monocytes Relative: 9 %
Neutro Abs: 6.3 K/uL (ref 1.7–7.7)
Neutrophils Relative %: 67 %
Platelets: 299 K/uL (ref 150–400)
RBC: 5.07 MIL/uL (ref 4.22–5.81)
RDW: 13.8 % (ref 11.5–15.5)
WBC: 9.4 K/uL (ref 4.0–10.5)
nRBC: 0 % (ref 0.0–0.2)

## 2024-01-17 LAB — ETHANOL: Alcohol, Ethyl (B): <15 mg/dL

## 2024-01-17 LAB — ACETAMINOPHEN LEVEL: Acetaminophen (Tylenol), Serum: <10 ug/mL — ABNORMAL LOW (ref 10–30)

## 2024-01-17 MED ORDER — ZIPRASIDONE MESYLATE 20 MG IM SOLR
20.0000 mg | Freq: Once | INTRAMUSCULAR | Status: AC
  Administered 2024-01-17: 20 mg via INTRAMUSCULAR
  Filled 2024-01-17: qty 20

## 2024-01-17 MED ORDER — LORAZEPAM 2 MG/ML IJ SOLN
2.0000 mg | Freq: Once | INTRAMUSCULAR | Status: AC
  Administered 2024-01-17: 2 mg via INTRAMUSCULAR
  Filled 2024-01-17: qty 1

## 2024-01-17 MED ORDER — DIPHENHYDRAMINE HCL 50 MG/ML IJ SOLN
50.0000 mg | Freq: Once | INTRAMUSCULAR | Status: DC
  Filled 2024-01-17: qty 1

## 2024-01-17 MED ORDER — DIPHENHYDRAMINE HCL 50 MG/ML IJ SOLN
50.0000 mg | Freq: Once | INTRAMUSCULAR | Status: AC
  Administered 2024-01-17: 50 mg via INTRAMUSCULAR
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
## 2024-01-17 NOTE — ED Notes (Signed)
 The patient came in IVC'd with 3 copies of the findings and custody and affidavit and petition orders.  Dr. Darra was given the first exam to complete.  The copies were given to the orange zone secretary and report given.  The affidavit and petition along with the first exam has been uploaded to the patient's chart.

## 2024-01-17 NOTE — ED Notes (Signed)
 Findings, Affidavit, and First Exam uploaded to [facility]; Envelope [phone]

## 2024-01-17 NOTE — ED Notes (Signed)
 Envelope [id number] correction

## 2024-01-17 NOTE — ED Notes (Signed)
 Dr. Darra completed first exam, copies being made -- in process now

## 2024-01-17 NOTE — ED Notes (Signed)
 IVC good for 7 days
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
## 2024-01-17 NOTE — ED Notes (Signed)
 Pt. Moved from h27 to h15

## 2024-01-17 NOTE — ED Notes (Signed)
 All copies distributed, pt. Still in H027; copy in orange med rec drawer; 3 copies in orange zone purple clipboard; original in purple zone red folder

## 2024-01-17 NOTE — ED Notes (Signed)
 Pt refusing to let this RN start an IV and give meds as well as get bloodwork. Pt is verbally aggressive towards staff at this time. EDP made aware

## 2024-01-17 NOTE — ED Notes (Signed)
 Copies made -- first exam being loaded into chart, and copies being distributed

## 2024-01-17 NOTE — ED Notes (Signed)
 First exam uploaded -- findings and affidavit being uploaded

## 2024-01-18 ENCOUNTER — Encounter (HOSPITAL_COMMUNITY): Payer: Self-pay | Admitting: Psychiatry

## 2024-01-18 ENCOUNTER — Inpatient Hospital Stay (HOSPITAL_COMMUNITY)
Admission: AD | Admit: 2024-01-18 | Discharge: 2024-01-27 | DRG: 885 | Disposition: A | Discharge status: Home / Self Care | Payer: MEDICAID | Source: Intra-hospital

## 2024-01-18 DIAGNOSIS — F1220 Cannabis dependence, uncomplicated: Secondary | ICD-10-CM | POA: Diagnosis present

## 2024-01-18 DIAGNOSIS — F319 Bipolar disorder, unspecified: Secondary | ICD-10-CM

## 2024-01-18 DIAGNOSIS — F1210 Cannabis abuse, uncomplicated: Secondary | ICD-10-CM | POA: Diagnosis present

## 2024-01-18 DIAGNOSIS — F3111 Bipolar disorder, current episode manic without psychotic features, mild: Secondary | ICD-10-CM | POA: Diagnosis not present

## 2024-01-18 DIAGNOSIS — G4700 Insomnia, unspecified: Secondary | ICD-10-CM | POA: Diagnosis present

## 2024-01-18 DIAGNOSIS — Z8249 Family history of ischemic heart disease and other diseases of the circulatory system: Secondary | ICD-10-CM | POA: Diagnosis not present

## 2024-01-18 DIAGNOSIS — F419 Anxiety disorder, unspecified: Secondary | ICD-10-CM | POA: Diagnosis present

## 2024-01-18 DIAGNOSIS — F3110 Bipolar disorder, current episode manic without psychotic features, unspecified: Principal | ICD-10-CM | POA: Diagnosis present

## 2024-01-18 DIAGNOSIS — F17290 Nicotine dependence, other tobacco product, uncomplicated: Secondary | ICD-10-CM | POA: Diagnosis present

## 2024-01-18 DIAGNOSIS — Z79899 Other long term (current) drug therapy: Secondary | ICD-10-CM

## 2024-01-18 LAB — URINE DRUG SCREEN
Amphetamines: NEGATIVE
Barbiturates: NEGATIVE
Benzodiazepines: POSITIVE — AB
Cocaine: NEGATIVE
Fentanyl: NEGATIVE
Methadone Scn, Ur: NEGATIVE
Opiates: NEGATIVE
Tetrahydrocannabinol: POSITIVE — AB

## 2024-01-18 MED ORDER — HALOPERIDOL LACTATE 5 MG/ML IJ SOLN: 5.0000 mg | Freq: Three times a day (TID) | INTRAMUSCULAR | Status: DC | PRN

## 2024-01-18 MED ORDER — LORAZEPAM 2 MG/ML IJ SOLN: 2.0000 mg | Freq: Three times a day (TID) | INTRAMUSCULAR | Status: DC | PRN

## 2024-01-18 MED ORDER — ACETAMINOPHEN 325 MG PO TABS
650.0000 mg | ORAL_TABLET | Freq: Four times a day (QID) | ORAL | Status: DC | PRN
  Administered 2024-01-22 – 2024-01-26 (×4): 650 mg via ORAL
  Filled 2024-01-18 (×4): qty 2

## 2024-01-18 MED ORDER — OLANZAPINE 5 MG PO TBDP: 5.0000 mg | ORAL_TABLET | Freq: Two times a day (BID) | ORAL | Status: DC

## 2024-01-18 MED ORDER — ZIPRASIDONE MESYLATE 20 MG IM SOLR
20.0000 mg | Freq: Once | INTRAMUSCULAR | Status: AC
  Administered 2024-01-18: 20 mg via INTRAMUSCULAR
  Filled 2024-01-18: qty 20

## 2024-01-18 MED ORDER — ALUM & MAG HYDROXIDE-SIMETH 200-200-20 MG/5ML PO SUSP: 30.0000 mL | ORAL | Status: DC | PRN

## 2024-01-18 MED ORDER — LORAZEPAM 1 MG PO TABS
2.0000 mg | ORAL_TABLET | Freq: Four times a day (QID) | ORAL | Status: DC | PRN
  Filled 2024-01-18: qty 2

## 2024-01-18 MED ORDER — STERILE WATER FOR INJECTION IJ SOLN
INTRAMUSCULAR | Status: AC
  Filled 2024-01-18: qty 10

## 2024-01-18 MED ORDER — TRAZODONE HCL 50 MG PO TABS
50.0000 mg | ORAL_TABLET | Freq: Every evening | ORAL | Status: DC | PRN
  Administered 2024-01-18: 50 mg via ORAL
  Filled 2024-01-18: qty 1

## 2024-01-18 MED ORDER — OLANZAPINE 5 MG PO TBDP
5.0000 mg | ORAL_TABLET | Freq: Two times a day (BID) | ORAL | Status: DC
  Administered 2024-01-18 – 2024-01-19 (×2): 5 mg via ORAL
  Filled 2024-01-18 (×2): qty 1

## 2024-01-18 MED ORDER — DIPHENHYDRAMINE HCL 50 MG/ML IJ SOLN: 50.0000 mg | Freq: Three times a day (TID) | INTRAMUSCULAR | Status: DC | PRN

## 2024-01-18 MED ORDER — HYDROXYZINE HCL 25 MG PO TABS
25.0000 mg | ORAL_TABLET | Freq: Three times a day (TID) | ORAL | Status: DC | PRN
  Administered 2024-01-19 – 2024-01-26 (×8): 25 mg via ORAL
  Filled 2024-01-18 (×7): qty 1
  Filled 2024-01-18: qty 10
  Filled 2024-01-18: qty 1

## 2024-01-18 MED ORDER — NICOTINE POLACRILEX 2 MG MT GUM
2.0000 mg | CHEWING_GUM | OROMUCOSAL | Status: DC | PRN
  Administered 2024-01-18 – 2024-01-27 (×26): 2 mg via ORAL
  Filled 2024-01-18 (×10): qty 1

## 2024-01-18 MED ORDER — HALOPERIDOL LACTATE 5 MG/ML IJ SOLN: 10.0000 mg | Freq: Three times a day (TID) | INTRAMUSCULAR | Status: DC | PRN

## 2024-01-18 MED ORDER — DIPHENHYDRAMINE HCL 25 MG PO CAPS
50.0000 mg | ORAL_CAPSULE | Freq: Three times a day (TID) | ORAL | Status: DC | PRN
  Administered 2024-01-18 – 2024-01-19 (×2): 50 mg via ORAL
  Filled 2024-01-18 (×2): qty 2

## 2024-01-18 MED ORDER — OLANZAPINE 5 MG PO TBDP
5.0000 mg | ORAL_TABLET | Freq: Every day | ORAL | Status: DC
  Administered 2024-01-18: 5 mg via ORAL
  Filled 2024-01-18: qty 1

## 2024-01-18 MED ORDER — OLANZAPINE 10 MG PO TABS
5.0000 mg | ORAL_TABLET | Freq: Three times a day (TID) | ORAL | Status: DC | PRN
  Administered 2024-01-18: 5 mg via ORAL
  Filled 2024-01-18: qty 1

## 2024-01-18 MED ORDER — HALOPERIDOL 5 MG PO TABS
5.0000 mg | ORAL_TABLET | Freq: Three times a day (TID) | ORAL | Status: DC | PRN
  Administered 2024-01-18 – 2024-01-19 (×2): 5 mg via ORAL
  Filled 2024-01-18 (×2): qty 1

## 2024-01-18 MED ORDER — MAGNESIUM HYDROXIDE 400 MG/5ML PO SUSP
30.0000 mL | Freq: Every day | ORAL | Status: DC | PRN
  Filled 2024-01-18: qty 30

## 2024-01-18 NOTE — ED Provider Notes (Signed)
 Emergency Medicine Observation Re-evaluation Note  Odean Fester is a 24 y.o. male, seen on rounds today.  Pt initially presented to the ED for complaints of IVC Currently, the patient is Sleeping.  Physical Exam  BP 119/70   Pulse 78   Temp 97.6 F (36.4 C) (Oral)   Resp 16   SpO2 100%  Physical Exam General: No acute distress Cardiac: Well-perfused Lungs: Nonlabored Psych: Unable to assess  ED Course / MDM  EKG:EKG Interpretation Date/Time:  Monday January 17 2024 21:12:02 EST Ventricular Rate:  67 PR Interval:  154 QRS Duration:  88 QT Interval:  388 QTC Calculation: 409 R Axis:   78  Text Interpretation: Normal sinus rhythm Normal ECG When compared with ECG of 06-May-2022 02:33, PREVIOUS ECG IS PRESENT Confirmed by Darra Chew [phone]) on 01/17/2024 10:43:19 PM  I have reviewed the labs performed to date as well as medications administered while in observation.  Recent changes in the last 24 hours include patient medicated with Geodon  at 6 AM for agitation.  Plan  Current plan is for psychiatric assessment when able.    Towana Ozell BROCKS, MD 01/18/24 [phone]

## 2024-01-18 NOTE — ED Notes (Signed)
 Pt became very agitated with nurse. Rn called pa and security to bedside. Pt became increasingly agitated. Pt stated I am the king of the world you can't tell me what to do

## 2024-01-18 NOTE — ED Notes (Signed)
 Patient escorted by [facility]

## 2024-01-18 NOTE — Progress Notes (Addendum)
 Pt has been accepted to [facility] 01/18/2024. Bed assignment: 501-1  Pt meets inpatient criteria per, Elveria Batter, NP  Attending Physician will be Dr Raliegh  Report can be called to: --Adult unit: [phone]  Pt can arrive after - RN will update  Care Team Notified: [facility], Cherylynn Ernst, RN, Elveria Batter, NP, Mancel Garfield, RN

## 2024-01-18 NOTE — ED Notes (Signed)
 Pt now awake and requesting something to eat/drink. Pt given a breakfast tray. Pt has pressured speech and states, Later I'll tell you who I really am to the sitter and mumbles statements that are tangential in nature and do not seem connected to reality. Pt is asking how long it will be until he will be discharged. MD made aware.

## 2024-01-18 NOTE — BH Assessment (Signed)
 Clinician messaged Lamarr Ku, RN, if the pt is able to engage in the assessment.   Per Lamarr, RN, the pt was medicated earlier and is currently sleeping. Clinician asked the RN to let her know when the pt is able to engage.    Jackson JONETTA Broach, MS, [facility], [facility] Triage Specialist [phone]

## 2024-01-18 NOTE — Group Note (Signed)
 Date:  01/18/2024 Time:  8:48 PM  Group Topic/Focus:  Wrap-Up Group:   The focus of this group is to help patients review their daily goal of treatment and discuss progress on daily workbooks.    Participation Level:  Did Not Attend   Derrick Joseph 01/18/2024, 8:48 PM

## 2024-01-18 NOTE — ED Provider Notes (Signed)
" °  [facility] EMERGENCY DEPARTMENT AT [facility] Provider Note    5:46 AM Notified by nursing team that patient is waking up and is very agitated.  Last dose of Geodon  was 7:23 pm.  He has normal QTC.  Discussed with Dr. Palumbo, who says ok to give geodon  again.  Procedures .Critical Care  Performed by: Vicky Charleston, PA-C Authorized by: Vicky Charleston, PA-C   Critical care provider statement:    Critical care time (minutes):  32   Critical care was time spent personally by me on the following activities:  Development of treatment plan with patient or surrogate, discussions with consultants, evaluation of patient's response to treatment, examination of patient, ordering and review of laboratory studies, ordering and review of radiographic studies, ordering and performing treatments and interventions, pulse oximetry, re-evaluation of patient's condition and review of old charts      Vicky Charleston, PA-C 01/18/24 0547    Palumbo, April, MD 01/18/24 [phone]  "

## 2024-01-18 NOTE — ED Notes (Signed)
 [facility] called by RN and was informed that [facility] will secure chat when there is a room available and ready for pt.

## 2024-01-18 NOTE — Consult Note (Signed)
 [facility] Psychiatric Consult Initial  Patient Name: .Derrick Joseph  MRN: [id number]  DOB: 2000-11-16  Consult Order details:  Orders (From admission, onward)     Start     Ordered   01/18/24 0104  CONSULT TO CALL ACT TEAM       Ordering Provider: Vicky Charleston, PA-C  Provider:  (Not yet assigned)  Question:  Reason for Consult?  Answer:  Psych consult   01/18/24 0103             Mode of Visit: In person    Psychiatry Consult Evaluation  Service Date: January 18, 2024 LOS:  LOS: 0 days  Chief Complaint under IVC by mother.  Exhibiting erratic and manic behavior.  According to IVC paperwork he was also wearing ski mask and talking about killing people.  Primary Psychiatric Diagnoses  Bipolar 1 disorder, most recent episode manic 2.  Erratic behavior  Assessment  Derrick Joseph is a 24 y.o. male admitted: Presented to the EDfor 01/17/2024  7:20 PM Exhibiting erratic and manic behavior.  According to IVC paperwork he was also wearing ski mask and talking about killing people.SABRA He carries the psychiatric diagnoses of bipolar 1 disorder, marijuana use, tobacco dependence and has a past medical history of concussion.   His current presentation of manic behavior with grandiosity, flight of ideas, delusions, tangentially, religiosity is most consistent with Bipolar 1 with manic episode . He meets criteria for psychiatric admission based on current symptomology.    Patient presents to the emergency department under involuntary commitment initiated by his mother.  On approach, patient is alert to self and hospital.  Patient reports that he came to the hospital voluntarily to check into [facility]; however, per chart review, patient presented with his mother and under involuntary commitment.  Patient is observed to be markedly manic.  He is hyperverbal with pressured, rapid speech and is difficult to redirect.  Mood appears elevated; affect is expansive.  Patient is pleasant,  smiling, and joking throughout the assessment but demonstrates poor insight into his condition.  Thought processes tangential with flight of ideas, making assessment challenging due to poor focus and distractibility.  Patient exhibits prominent grandiose and delusional thought content.  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  He believes that his mother owns this hospital and that she owns the town of [place].  He believes that he owns multiple houses.  Patient also demonstrates hyperreligious content, stating that God and angels speaks to him and that he frequently sees angels.  Patient denies suicidal ideation and homicidal ideation.  He denies auditory visual hallucinations despite endorsing experiences of seeing and hearing angels, suggesting limited insight into psychotic symptoms.  No command hallucinations reported.  Patient reports he is not currently taking any psychiatric medications and states he stopped taking them last year because he did not feel they were helpful.  Given the severity of manic symptoms, presence of grandiose and religious delusions, impaired insight and judgment, medication nonadherence, and inability to reliably engage in assessment, psychiatric inpatient admission is recommended for stabilization  Please see plan below for detailed recommendations.   Diagnoses:  Active Hospital problems: Principal Problem:   Bipolar I disorder, most recent episode (or current) manic (HCC)    Plan   ## Psychiatric Medication Recommendations:  Increase Zyprexa  5 mg daily to Zyprexa  5 mg p.o. twice daily   Start-Zyprexa  5 mg  p.o. every 8 hour as needed for agitation  ## Medical Decision Making Capacity: Not specifically addressed in this encounter  ## Further Work-up:  -- None at this time  -- most recent EKG on 01/17/2023 had QtC of 409 -- Pertinent labwork  reviewed earlier this admission includes: C MP, CBC, acetaminophen  level, salicylate level, UDS positive for benzodiazepines and THC   ## Disposition:-- We recommend inpatient psychiatric hospitalization after medical hospitalization. Patient has been involuntarily committed on 01/17/2023.   ## Behavioral / Environmental: -Difficult Patient (SELECT OPTIONS FROM BELOW), To minimize splitting of staff, assign one staff person to communicate all information from the team when feasible., or Utilize compassion and acknowledge the patient's experiences while setting clear and realistic expectations for care.    ## Safety and Observation Level:  - Based on my clinical evaluation, I estimate the patient to be at low risk of self harm in the current setting. - At this time, we recommend  1:1 Observation. This decision is based on my review of the chart including patient's history and current presentation, interview of the patient, mental status examination, and consideration of suicide risk including evaluating suicidal ideation, plan, intent, suicidal or self-harm behaviors, risk factors, and protective factors. This judgment is based on our ability to directly address suicide risk, implement suicide prevention strategies, and develop a safety plan while the patient is in the clinical setting. Please contact our team if there is a concern that risk level has changed.  CSSR Risk Category:C-SSRS RISK CATEGORY: No Risk  Suicide Risk Assessment: Patient has following modifiable risk factors for suicide: recklessness, medication noncompliance, active mental illness (to encompass adhd, tbi, mania, psychosis, trauma reaction), current symptoms: anxiety/panic, insomnia, impulsivity, anhedonia, hopelessness, triggering events, and recent psychiatric hospitalization, which we are addressing by recommending psychiatric admission. Patient has following non-modifiable or demographic risk factors for suicide: male gender  and psychiatric hospitalization Patient has the following protective factors against suicide: Access to outpatient mental health care and Supportive family  Thank you for this consult request. Recommendations have been communicated to the primary team.  We will continue to follow while awaiting psychiatric bed placement at this time.   Derrick VEAR Batter, NP       History of Present Illness  Relevant Aspects of Hospital ED Course:  Admitted on 01/17/2024 for Exhibiting erratic and manic behavior.  According to IVC paperwork he was also wearing ski mask and talking about killing people.SABRA He carries the psychiatric diagnoses of bipolar 1 disorder, marijuana use, tobacco dependence and has a past medical history of concussio  Patient Report:  I need to go I have my security detail waiting for me outside  [facility] petition by mother Grayce Bohr [phone] IVC findings are as follows, respondent is diagnosed bipolar.  He believes he owns 3 houses and is seeing things that are not visible example lights.  Believes mother owns the town of [place], got out of mother's car at [employer] and told her he could break her door and walked off.  Told mother he is wearing a ski mask and he could kill someone.  Committed in the past.  Dr. Fonda Law, Wai Litt is a 24 y.o. male patient with history of bipolar disorder presents to the emergency department with police under IVC.  He is exhibiting erratic behavior with shouting and claiming that he owns much of the city and hospital.  He claims to be communicating with God and apparently today tried to jump out of his mother's  car when driving for help.  According to the IVC paperwork he was also wearing a mask and talking about killing people.  On my evaluation he is agitated and disorganized. Level 5 caveat applies  Psych ROS:  Depression: Denies Anxiety: The denies Mania (lifetime and current): Yes Psychosis: (lifetime and current): Yes  Collateral  information:   Grayce Bohr [phone] mother, She thought patient had been following up with [facility] for injection. States he had been doing well until this past Saturday. Then on Sunday he came to her house and told her that he had a nervous break down.He does live in an apartment with a roommate and does work for [employer]. She thinks he has been smoking marijuana and when he does this it makes him go into an episode. He had previously stopped smoking it.   Mother states he does better when he is on an injection.   Review of Systems  Respiratory:  Negative for shortness of breath.   Gastrointestinal:  Negative for nausea and vomiting.  Neurological:  Negative for tremors.  Psychiatric/Behavioral:  The patient is nervous/anxious and has insomnia.      Psychiatric and Social History  Psychiatric History:  Information collected from chart review and patient  Prev Dx/Sx: Bipolar disorder with mania, marijuana use, tobacco dependence Current Psych Provider: None currently has followed up with Laymon Bach at [facility] in the past Home Meds (current): None currently Previous Med Trials: Invega  injection, Cogentin , lorazepam , Prozac acid, olanzapine , lithium carbonate, Geodon , Haldol  Therapy: Denies  Prior Psych Hospitalization: Multiple-patient reports over 16 Prior Self Harm: Patient denies Prior Violence: Patient denies  Family Psych History: Father-substance abuse, older sister bipolar disorder Family Hx suicide: Denies  Social History:  Developmental Hx: Unknown Educational Hx: High school Occupational Hx: Unemployed, but later states that he receives a check from [employer] Legal Hx: Denies Living Situation: Unable to assess patient initially reported he lives with his mother but then states he does not Spiritual Hx: Christian Access to weapons/lethal means: Currently denies  Substance History Alcohol: Rarely History of alcohol withdrawal seizures unable to assess History of  DT's unable to assess Tobacco: Daily does not say how much Illicit drugs: States he smokes a lot of weed Prescription drug abuse: Denies Rehab hx: Unable to assess  Exam Findings  Physical Exam:  Vital Signs:  Temp:  [97.6 F (36.4 C)-98.6 F (37 C)] 97.6 F (36.4 C) (01/13 0725) Pulse Rate:  [61-85] 61 (01/13 1054) Resp:  [16] 16 (01/13 1054) BP: (109-134)/(70-95) 134/84 (01/13 1054) SpO2:  [100 %] 100 % (01/13 1054) Blood pressure 134/84, pulse 61, temperature 97.6 F (36.4 C), temperature source Oral, resp. rate 16, SpO2 100%. There is no height or weight on file to calculate BMI.  Physical Exam Pulmonary:     Effort: No respiratory distress.  Musculoskeletal:     Cervical back: Normal range of motion.  Neurological:     Mental Status: He is alert and oriented to person, place, and time.  Psychiatric:        Attention and Perception: He is inattentive.        Mood and Affect: Mood is anxious and elated.        Speech: Speech is rapid and pressured.        Behavior: Behavior is hyperactive.        Thought Content: Thought content is delusional.        Cognition and Memory: Cognition normal.  Judgment: Judgment is impulsive.     Mental Status Exam: General Appearance: Casual  Orientation:  Full (Time, Place, and Person)  Memory:  Immediate;   Poor Recent;   Poor Remote;   Poor  Concentration:  Concentration: Poor and Attention Span: Poor  Recall:  Fair  Attention  Fair  Eye Contact:  Fair  Speech:  Pressured  Language:  Good  Volume:  normal to increased   Mood: happy   Affect:  Congruent  Thought Process:  Coherent and Descriptions of Associations: Tangential grandious  Thought Content:  Illogical, Delusions, and Tangential flight of ideas   Suicidal Thoughts:  No  Homicidal Thoughts:  No  Judgement:  Impaired  Insight:  Lacking  Psychomotor Activity:  Increased  Akathisia:  No  Fund of Knowledge:  Poor      Assets:  Communication  Skills Desire for Improvement Physical Health Resilience Social Support  Cognition:  WNL  ADL's:  Intact  AIMS (if indicated):        Other History   These have been pulled in through the EMR, reviewed, and updated if appropriate.  Family History:  The patient's family history is not on file.  Medical History: History reviewed. No pertinent past medical history.  Surgical History: History reviewed. No pertinent surgical history.   Medications:  Current Medications[1]  Allergies: Allergies[2]  Derrick VEAR Batter, NP     [1]  Current Facility-Administered Medications:    LORazepam  (ATIVAN ) tablet 2 mg, 2 mg, Oral, Q6H PRN, Towana Ozell BROCKS, MD   OLANZapine  (ZYPREXA ) tablet 5 mg, 5 mg, Oral, Q8H PRN, Joseph Derrick VEAR, NP   OLANZapine  zydis (ZYPREXA ) disintegrating tablet 5 mg, 5 mg, Oral, BID, Joseph Derrick VEAR, NP No current outpatient medications on file. [2] No Known Allergies

## 2024-01-18 NOTE — Progress Notes (Addendum)
 This clinical research associate observed the patient giving another patient his phone number. This writer told the patient not to give out his number.  At first the patient denied giving the other patient his number He then asked why.  This clinical research associate told him it was [facility] policy to not exchange number because something could happen after discharge that may set him back in his treatment. The patient took back the paper he gave the other patient, crush it up and put the paper in the garbage.  This clinical research associate talked to him about focusing on taking care of himself and his treatment while he is here. Patient nurse was notified.

## 2024-01-18 NOTE — ED Notes (Addendum)
 Patient's belongings removed from security and given to GDP. Patient aware and agreeable.

## 2024-01-18 NOTE — ED Notes (Signed)
 [facility] has been called for transport to [facility]/ not ETA given

## 2024-01-18 NOTE — ED Notes (Signed)
 Pt moved from H15 to H3

## 2024-01-18 NOTE — ED Provider Notes (Signed)
 Patient signed out to me by Dr. Darra.  Patient waiting on labs before being medically clear.  1:04 AM Labs are reassuring.  Patient is medically clear.   Will consult TTS.  Dispo per TTS.   Vicky Charleston, PA-C 01/18/24 0105    Palumbo, April, MD 01/18/24 0140

## 2024-01-18 NOTE — ED Notes (Signed)
 Pt wanded by security and patient valuables given to security and placed in safe.

## 2024-01-18 NOTE — Progress Notes (Signed)
 Admission 1430 Received patient to admission. He presents with hyperactivity, boisterous, and delusional. Patient denies SI, HI and AVH. Patient introduced his self to nurse and MHT as [place] and he owns everything. Patient would not sit still while doing assessment. He knows he is IVC and by his mother. Patient reports being admitted here before and remembers the rules. He would not sign all the consents. Skin intact and vitals completed. Patient unable to follow simple directions due to his hyper verbal and hyper active state and Nurse walked patient to the unit and oriented. Nurse had to educate patient on respecting personal space and boundaries with male nurse.   1730: Patient has been on the unit pacing and defiant when staff tries to redirect him. PRN agitation medication given.

## 2024-01-18 NOTE — Tx Team (Signed)
 Initial Treatment Plan 01/18/2024 6:12 PM Darilyn Monte FMW:[id number]    PATIENT STRESSORS: Other: UTA     PATIENT STRENGTHS: Other: UTA   PATIENT IDENTIFIED PROBLEMS: UTA                     DISCHARGE CRITERIA:  Ability to meet basic life and health needs  PRELIMINARY DISCHARGE PLAN  PATIENT/FAMILY INVOLVEMENT: This treatment plan has been presented to and reviewed with the patient, Derrick Joseph. The patient and family have been given the opportunity to ask questions and make suggestions.  Elouise Wolm Morel, RN 01/18/2024, 6:12 PM

## 2024-01-19 ENCOUNTER — Other Ambulatory Visit (HOSPITAL_COMMUNITY): Payer: Self-pay

## 2024-01-19 ENCOUNTER — Encounter (HOSPITAL_COMMUNITY): Payer: Self-pay

## 2024-01-19 ENCOUNTER — Telehealth (HOSPITAL_COMMUNITY): Payer: Self-pay

## 2024-01-19 MED ORDER — DIVALPROEX SODIUM 250 MG PO DR TAB
250.0000 mg | DELAYED_RELEASE_TABLET | Freq: Two times a day (BID) | ORAL | Status: DC
  Administered 2024-01-19 – 2024-01-27 (×17): 250 mg via ORAL
  Filled 2024-01-19 (×13): qty 1
  Filled 2024-01-19: qty 14
  Filled 2024-01-19 (×4): qty 1

## 2024-01-19 MED ORDER — PALIPERIDONE ER 3 MG PO TB24
3.0000 mg | ORAL_TABLET | Freq: Every day | ORAL | Status: DC
  Administered 2024-01-20: 3 mg via ORAL
  Filled 2024-01-19: qty 1

## 2024-01-19 MED ORDER — PALIPERIDONE ER 1.5 MG PO TB24
1.5000 mg | ORAL_TABLET | Freq: Once | ORAL | Status: AC
  Administered 2024-01-19: 1.5 mg via ORAL
  Filled 2024-01-19: qty 1

## 2024-01-19 MED ORDER — TRAZODONE HCL 100 MG PO TABS
100.0000 mg | ORAL_TABLET | Freq: Every day | ORAL | Status: DC
  Administered 2024-01-19 – 2024-01-20 (×2): 100 mg via ORAL
  Filled 2024-01-19 (×2): qty 1

## 2024-01-19 NOTE — Progress Notes (Signed)
 Patient manic and increasingly agitated. He is hyper verbal and arguing with staff. PRN PO haldol  5mg  and PO benadryl  50mg  administered from the mild agitation protocol. Safety maintained. Will continue to monitor.

## 2024-01-19 NOTE — [facility] Group Notes (Signed)
 Adult Psychoeducational Group Note  Date:  01/19/2024 Time:  3:55 PM  Group Topic/Focus: Recreation Therapy  Participation Level:  Active  Participation Quality:    Affect:    Cognitive:    Insight:   Engagement in Group:    Modes of Intervention:    Additional Comments:    Ciaran Begay O 01/19/2024, 3:55 PM

## 2024-01-19 NOTE — Progress Notes (Signed)
(  Sleep Hours) - (Any PRNs that were needed, meds refused, or side effects to meds)- Trazodone  and nicotine  gum (Any disturbances and when (visitation, over night)-pt remains boisterous, delusional and disorganized. Writer heard the pt stating to a peer he was discharging tomorrow and was buying Joette Pt was observed by MHT exchanging his phone number with peer. Pt re educated on the expetections of the unit rules and  the Importancing on focucing on recovery while admitted here.Pt heard speaking to MHT stating, I am the king of the [place] .I can leave and go at any time I want to. (Concerns raised by the patient)-  (SI/HI/AVH)-denied

## 2024-01-19 NOTE — BH IP Treatment Plan (Signed)
 Interdisciplinary Treatment and Diagnostic Plan Update  01/19/2024 Time of Session: 1020AM Derrick Joseph MRN: [id number]  Principal Diagnosis: Bipolar disorder, current episode manic without psychotic features [facility])  Secondary Diagnoses: Principal Problem:   Bipolar disorder, current episode manic without psychotic features (HCC) Active Problems:   Cannabis use disorder, moderate, in controlled environment (HCC)   Bipolar 1 disorder (HCC)   Current Medications:  Current Facility-Administered Medications  Medication Dose Route Frequency Provider Last Rate Last Admin   acetaminophen  (TYLENOL ) tablet 650 mg  650 mg Oral Q6H PRN Coleman, Carolyn H, NP       alum & mag hydroxide-simeth (MAALOX/MYLANTA) 200-200-20 MG/5ML suspension 30 mL  30 mL Oral Q4H PRN Coleman, Carolyn H, NP       haloperidol  (HALDOL ) tablet 5 mg  5 mg Oral TID PRN Coleman, Carolyn H, NP   5 mg at 01/18/24 1726   And   diphenhydrAMINE  (BENADRYL ) capsule 50 mg  50 mg Oral TID PRN Coleman, Carolyn H, NP   50 mg at 01/18/24 1726   haloperidol  lactate (HALDOL ) injection 5 mg  5 mg Intramuscular TID PRN Mardy Elveria DEL, NP       And   diphenhydrAMINE  (BENADRYL ) injection 50 mg  50 mg Intramuscular TID PRN Mardy Elveria DEL, NP       And   LORazepam  (ATIVAN ) injection 2 mg  2 mg Intramuscular TID PRN Mardy Elveria DEL, NP       haloperidol  lactate (HALDOL ) injection 10 mg  10 mg Intramuscular TID PRN Mardy Elveria DEL, NP       And   diphenhydrAMINE  (BENADRYL ) injection 50 mg  50 mg Intramuscular TID PRN Mardy Elveria DEL, NP       And   LORazepam  (ATIVAN ) injection 2 mg  2 mg Intramuscular TID PRN Coleman, Carolyn H, NP       divalproex  (DEPAKOTE ) DR tablet 250 mg  250 mg Oral Q12H Nwoko, Mac I, NP   250 mg at 01/19/24 1244   hydrOXYzine  (ATARAX ) tablet 25 mg  25 mg Oral TID PRN Coleman, Carolyn H, NP   25 mg at 01/19/24 1130   magnesium  hydroxide (MILK OF MAGNESIA) suspension 30 mL  30 mL Oral Daily PRN Coleman,  Carolyn H, NP       nicotine  polacrilex (NICORETTE ) gum 2 mg  2 mg Oral PRN Pashayan, Alexander S, DO   2 mg at 01/19/24 1130   [START ON 01/20/2024] paliperidone  (INVEGA ) 24 hr tablet 3 mg  3 mg Oral Daily Nwoko, Agnes I, NP       traZODone  (DESYREL ) tablet 100 mg  100 mg Oral QHS Nwoko, Agnes I, NP       PTA Medications: No medications prior to admission.    Patient Stressors: Other: UTA    Patient Strengths: Other: UTA  Treatment Modalities: Medication Management, Group therapy, Case management,  1 to 1 session with clinician, Psychoeducation, Recreational therapy.   Physician Treatment Plan for Primary Diagnosis: Bipolar disorder, current episode manic without psychotic features (HCC) Long Term Goal(s): Improvement in symptoms so as ready for discharge   Short Term Goals: Ability to identify and develop effective coping behaviors will improve Ability to maintain clinical measurements within normal limits will improve Compliance with prescribed medications will improve Ability to identify triggers associated with substance abuse/mental health issues will improve Ability to identify changes in lifestyle to reduce recurrence of condition will improve Ability to verbalize feelings will improve Ability to disclose and discuss suicidal ideas Ability  to demonstrate self-control will improve  Medication Management: Evaluate patient's response, side effects, and tolerance of medication regimen.  Therapeutic Interventions: 1 to 1 sessions, Unit Group sessions and Medication administration.  Evaluation of Outcomes: Not Progressing  Physician Treatment Plan for Secondary Diagnosis: Principal Problem:   Bipolar disorder, current episode manic without psychotic features (HCC) Active Problems:   Cannabis use disorder, moderate, in controlled environment (HCC)   Bipolar 1 disorder (HCC)  Long Term Goal(s): Improvement in symptoms so as ready for discharge   Short Term Goals: Ability to  identify and develop effective coping behaviors will improve Ability to maintain clinical measurements within normal limits will improve Compliance with prescribed medications will improve Ability to identify triggers associated with substance abuse/mental health issues will improve Ability to identify changes in lifestyle to reduce recurrence of condition will improve Ability to verbalize feelings will improve Ability to disclose and discuss suicidal ideas Ability to demonstrate self-control will improve     Medication Management: Evaluate patient's response, side effects, and tolerance of medication regimen.  Therapeutic Interventions: 1 to 1 sessions, Unit Group sessions and Medication administration.  Evaluation of Outcomes: Not Progressing   RN Treatment Plan for Primary Diagnosis: Bipolar disorder, current episode manic without psychotic features (HCC) Long Term Goal(s): Knowledge of disease and therapeutic regimen to maintain health will improve  Short Term Goals: Ability to remain free from injury will improve, Ability to verbalize frustration and anger appropriately will improve, Ability to demonstrate self-control, Ability to participate in decision making will improve, Ability to verbalize feelings will improve, Ability to disclose and discuss suicidal ideas, Ability to identify and develop effective coping behaviors will improve, and Compliance with prescribed medications will improve  Medication Management: RN will administer medications as ordered by provider, will assess and evaluate patient's response and provide education to patient for prescribed medication. RN will report any adverse and/or side effects to prescribing provider.  Therapeutic Interventions: 1 on 1 counseling sessions, Psychoeducation, Medication administration, Evaluate responses to treatment, Monitor vital signs and CBGs as ordered, Perform/monitor CIWA, COWS, AIMS and Fall Risk screenings as ordered, Perform  wound care treatments as ordered.  Evaluation of Outcomes: Not Progressing   LCSW Treatment Plan for Primary Diagnosis: Bipolar disorder, current episode manic without psychotic features (HCC) Long Term Goal(s): Safe transition to appropriate next level of care at discharge, Engage patient in therapeutic group addressing interpersonal concerns.  Short Term Goals: Engage patient in aftercare planning with referrals and resources, Increase social support, Increase ability to appropriately verbalize feelings, Increase emotional regulation, Facilitate acceptance of mental health diagnosis and concerns, Facilitate patient progression through stages of change regarding substance use diagnoses and concerns, Identify triggers associated with mental health/substance abuse issues, and Increase skills for wellness and recovery  Therapeutic Interventions: Assess for all discharge needs, 1 to 1 time with Social worker, Explore available resources and support systems, Assess for adequacy in community support network, Educate family and significant other(s) on suicide prevention, Complete Psychosocial Assessment, Interpersonal group therapy.  Evaluation of Outcomes: Not Progressing   Progress in Treatment: Attending groups: No. Participating in groups: No. Taking medication as prescribed: Yes. Toleration medication: Yes. Family/Significant other contact made: No, will contact:  Grayce Rosaline Bohr Patient understands diagnosis: No. Discussing patient identified problems/goals with staff: Yes. Medical problems stabilized or resolved: Yes. Denies suicidal/homicidal ideation: Yes. Issues/concerns per patient self-inventory: No.  New problem(s) identified: No, Describe:  none  New Short Term/Long Term Goal(s): medication stabilization, elimination of SI thoughts, development of comprehensive  mental wellness plan.    Patient Goals:  I needed a reset my goal here is done  Discharge Plan or  Barriers: Patient recently admitted. CSW will continue to follow and assess for appropriate referrals and possible discharge planning.    Reason for Continuation of Hospitalization: Delusions  Mania Medication stabilization  Estimated Length of Stay: 5-7 days  Last 3 [place] Suicide Severity Risk Score: Flowsheet Row Ad[facility] (Current) [facility]/17/2024 in [facility] Department at [facility] ED from 07/07/2023 in [facility] Emergency Department at [facility]  C-SSRS RISK CATEGORY No Risk No Risk No Risk    Last PHQ 2/9 Scores:    08/18/2022    9:08 AM 08/18/2022    9:00 AM 04/27/2022    8:37 AM  Depression screen PHQ 2/9  Decreased Interest 0 0 0  Down, Depressed, Hopeless 0 0 0  PHQ - 2 Score 0 0 0  Altered sleeping 0 0 0  Tired, decreased energy 0 0 0  Change in appetite 0 0   Feeling bad or failure about yourself  0 0 0  Trouble concentrating 0 0 0  Moving slowly or fidgety/restless 0 0 0  Suicidal thoughts 0 0 0  PHQ-9 Score 0  0  0   Difficult doing work/chores Not difficult at all Not difficult at all Not difficult at all     Data saved with a previous flowsheet row definition    Scribe for Treatment Team: Jenkins LULLA Primer, LCSWA 01/19/2024 1:23 PM

## 2024-01-19 NOTE — BHH Suicide Risk Assessment (Signed)
 Suicide Risk Assessment  Admission Assessment    [facility] Admission Suicide Risk Assessment   Nursing information obtained from:     Demographic factors:  Male, Adolescent or young adult  Current Mental Status:  NA  Loss Factors:  NA  Historical Factors:  Impulsivity  Risk Reduction Factors:  Positive social support  Total Time spent with patient: 1.5 hours including H&P.  Principal Problem: Bipolar 1 disorder (HCC)  Diagnosis:  Principal Problem:   Bipolar 1 disorder (HCC)  Subjective Data: See H&P.  Continued Clinical Symptoms:  Alcohol Use Disorder Identification Test Final Score (AUDIT): 0 The Alcohol Use Disorders Identification Test, Guidelines for Use in Primary Care, Second Edition.  [employer] [facility]). Score between 0-7:  no or low risk or alcohol related problems. Score between 8-15:  moderate risk of alcohol related problems. Score between 16-19:  high risk of alcohol related problems. Score 20 or above:  warrants further diagnostic evaluation for alcohol dependence and treatment.  CLINICAL FACTORS:   Bipolar Disorder:   Mixed State Alcohol/Substance Abuse/Dependencies Unstable or Poor Therapeutic Relationship Previous Psychiatric Diagnoses and Treatments  Musculoskeletal: Strength & Muscle Tone: within normal limits Gait & Station: normal Patient leans: N/A  Psychiatric Specialty Exam:  Presentation  General Appearance: Casual; Fairly Groomed  Eye Contact:Fair  Speech:Clear and Coherent; Pressured  Speech Volume:Increased  Handedness:Right   Mood and Affect  Mood:Labile  Affect:Congruent; Labile   Thought Process  Thought Processes:Disorganized  Descriptions of Associations:Tangential  Orientation:Full (Time, Place and Person)  Thought Content:Tangential; Scattered  History of Schizophrenia/Schizoaffective disorder:No data recorded Duration of Psychotic Symptoms:No data recorded Hallucinations:Hallucinations: --  (Patient denies.)  Ideas of Reference:Delusions (Says he is working a job he is being paid a thousand dollars a week. Says has multiple jobs & has more money than anyone in this hospital.)  Suicidal Thoughts:Suicidal Thoughts: No  Homicidal Thoughts:Homicidal Thoughts: No   Sensorium  Memory:Immediate Fair; Recent Fair; Remote Fair  Judgment:Impaired  Insight:Lacking   Executive Functions  Concentration:Poor  Attention Span:Poor  Recall:Fair  Fund of Knowledge:Poor  Language:Fair   Psychomotor Activity  Psychomotor Activity:No data recorded  Assets  Assets:Desire for Improvement; Housing; Physical Health; Resilience; Social Support   Sleep  Sleep:Sleep: Fair Number of Hours of Sleep: 5.5   Physical Exam: See H&P.  Blood pressure 123/85, pulse 65, temperature 97.8 F (36.6 C), temperature source Oral, resp. rate 16, height 5' 7 (1.702 m), weight 56.7 kg, SpO2 100%. Body mass index is 19.58 kg/m.  COGNITIVE FEATURES THAT CONTRIBUTE TO RISK:  Polarized thinking and Thought constriction (tunnel vision)    SUICIDE RISK:   Severe:  Frequent, intense, and enduring suicidal ideation, specific plan, no subjective intent, but some objective markers of intent (i.e., choice of lethal method), the method is accessible, some limited preparatory behavior, evidence of impaired self-control, severe dysphoria/symptomatology, multiple risk factors present, and few if any protective factors, particularly a lack of social support.  PLAN OF CARE: See H&P.  I certify that inpatient services furnished can reasonably be expected to improve the patient's condition.   Mac Bolster, NP, pmhnp, fnp-bc. 01/19/2024, 11:05 AM

## 2024-01-19 NOTE — Plan of Care (Signed)

## 2024-01-19 NOTE — Progress Notes (Signed)
 Recreation Therapy Notes  INPATIENT RECREATION THERAPY ASSESSMENT  Patient Details Name: Derrick Joseph MRN: [id number] DOB: 2000-05-09 Today's Date: 01/19/2024       Information Obtained From: Patient  Able to Participate in Assessment/Interview: Yes (Pt is very delusional. 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 Pt also stated his grandfather tells Trump what to do and that he travel everyday for work because he's different.)  Patient Presentation: Alert, Hyperverbal (Delusional)  Reason for Admission (Per Patient): Other (Comments) (mother)  Patient Stressors: Other (Comment) (females)  Coping Skills:   Isolation, Journal, Sports, TV, Exercise, Music, Meditate, Deep Breathing, Art, Talk, Prayer, Avoidance, Read, Dance, Hot Bath/Shower  Leisure Interests (2+):  Community - Travel (Comment)  Frequency of Recreation/Participation: Other (Comment) (Daily)  Awareness of Community Resources:  Yes  Community Resources:  Library, [employer]  Current Use: Yes  If no, Barriers?:    Expressed Interest in [employer] Information: No  [employer] of Residence:  Engineer, Technical Sales  Patient Main Form of Transportation: Set Designer  Patient Strengths:  Mind, Physical, come out of situation stronger than when I started  Patient Identified Areas of Improvement:  none  Patient Goal for Hospitalization:  just to isolate, think more and get some rest  Current SI (including self-harm):  No  Current HI:  No  Current AVH: No  Staff Intervention Plan: Group Attendance, Collaborate with Interdisciplinary Treatment Team  Consent to Intern Participation: N/A   Uno Esau-McCall, LRT,CTRS Avrianna Smart A Kalyn Hofstra-McCall 01/19/2024, 2:12 PM

## 2024-01-19 NOTE — BHH Group Notes (Signed)
 Adult Psychoeducational Group Note  Date:  01/19/2024 Time:  9:49 AM  Group Topic/Focus: Goals Group  Participation Level:  Did Not Attend  Participation Quality:    Affect:    Cognitive:    Insight:   Engagement in Group:    Modes of Intervention:    Additional Comments:    Jamelle Cassondra KIDD 01/19/2024, 9:49 AM

## 2024-01-19 NOTE — BHH Group Notes (Signed)
 Adult Psychoeducational Group Note  Date:  01/19/2024 Time:  3:56 PM  Group Topic/Focus: Emotional Wellness  Participation Level:  Active  Participation Quality:    Affect:    Cognitive:    Insight:   Engagement in Group:    Modes of Intervention:    Additional Comments:    Geovana Gebel O 01/19/2024, 3:56 PM

## 2024-01-19 NOTE — BHH Group Notes (Signed)
 Adult Psychoeducational Group Note  Date:  01/19/2024 Time:  3:55 PM  Group Topic/Focus: Physical Wellness  Participation Level:  Did Not Attend  Participation Quality:    Affect:    Cognitive:    Insight:   Engagement in Group:    Modes of Intervention:    Additional Comments:    Derrick Joseph 01/19/2024, 3:55 PM

## 2024-01-19 NOTE — Group Note (Signed)
 Recreation Therapy Group Note   Group Topic:Self-Esteem  Group Date: 01/19/2024 Start Time: 1032 End Time: 1112 Facilitators: Emmajean Ratledge-McCall, LRT,CTRS Location: [address] Dayroom   Group Topic: Self-esteem  Goal Area(s) Addresses:  Patient will identify and write at least one positive trait about themself. Patient will successfully identify influential people in their life and why they admire them. Patient will acknowledge the benefit of healthy self-esteem. Patient will endorse understanding of ways to increase self-esteem.   Behavioral Response: Hyperactive   Intervention: Personalized Plate- printed license plate template, markers or colored pencils   Activity: LRT began group session with open dialogue asking the patients to define self-esteem and verbally identify positive qualities and traits people may possess. Patients were then instructed to design a personalized license plate, with words and drawings, representing at least 3 positive things about themselves. Pts were encouraged to include favorites, things they are proud of, what they enjoy doing, and dreams for their future. If a patient had a life motto or a meaningful phase that expressed their life values, pt's were asked to incorporate that into their design as well. Patients were given the opportunity to share their completed work with the group.  Education: Healthy self-esteem, Positive character traits, Accepting compliments, Leisure as competence and coping, Support Systems, Discharge planning LRT educated patients on the importance of healthy self-esteem and ways to build self-esteem. LRT addressed discharge planning reviewing positive coping skills and healthy support systems.  Education Outcome: Acknowledges education/In group clarification offered   Affect/Mood: Appropriate   Participation Level: Active   Participation Quality: Independent   Behavior: Hyperverbal   Speech/Thought Process: Delusional  and Grandiose   Insight: Fair   Judgement: Fair    Modes of Intervention: Activity   Patient Response to Interventions:  Engaged   Education Outcome:  In group clarification offered    Clinical Observations/Individualized Feedback: Pt very hyperactive and hyper verbal. Pt needed some redirection from focusing on other things. Pt was delusion in some of the things he was saying such him being a prince and how much money he has. On his plate, pt was all over the place with the things he was expressing.       Plan: Continue to engage patient in RT group sessions 2-3x/week.   Ciara Kagan-McCall, LRT,CTRS 01/19/2024 1:44 PM

## 2024-01-19 NOTE — H&P (Signed)
 " Psychiatric Admission Assessment Adult  Patient Identification: Derrick Joseph  MRN:  [id number]  Date of Evaluation:  01/19/2024  Chief Complaint: Patient was noted shouting and claiming that he owns much of the city and hospital. He claims to be communicating with God and apparently today tried to jump out of his mother's car when driving for help.  According to the IVC paperwork he was also wearing a mask & talking about killing people.    Principal Diagnosis: Bipolar disorder, current episode manic without psychotic features (HCC)  Diagnosis:  Principal Problem:   Bipolar disorder, current episode manic without psychotic features (HCC) Active Problems:   Cannabis use disorder, moderate, in controlled environment (HCC)   Bipolar 1 disorder (HCC)  History of Present Illness: This is the second psychiatric admission in this [facility] for this 24 year old AA male with hx of mental illness (bipolar disorder) & cannabis use disorder. Admitted to the [facility] from the [facility] hospital with complaint of erratic behaviors (shouting and claiming that he owns much of the city and hospital. He claims to be communicating with God and apparently today tried to jump out of his mother's car when driving for help.  According to the IVC paperwork, he was also wearing a mask & talking about killing people).  A review of his current lab results has shown his UDS was positive for Benzodiazepine & THC). After medical evaluation & clearance at the [facility] ED, patient was recommended & transferred to the [facility] for inpatient psychiatric admission for psychiatric evaluation/treatments.During this evaluation, Derrick Joseph reports,   Two days ago, the police took me to the hospital because my mother went to the police, took out papers for the police to bring me here. I'm not crazy. I want you to know that I'm not crazy. I just came here to reset my mind. I'm taking a break. I work too much. I'm making a lot of money at my  work. I make $1,000.00 a week. I work a lot of jobs. I make more money that anyone in this hospital. I'm doing alright. I'm not depressed & I don't get depressed. I'm not anxious either. Stop asking me questions. If you want to know anything about me, ask my mother. Her name is Derrick Joseph. She is my queen.   Objective: Derrick Joseph presents during this evaluation, high manic with delusional & disorganized thoughts. His speech is pressured & tangential. He was walking around in his while this admission evaluation was on going. He will sit down briefly, gets up to stand by the window looking outside the window why briefly answering the assessment questions. He is currently a fair historian. He says he was receiving monthly Invega  injections in the past, but have not had in the last few months. Says he has a therapist but no mental health provider. He says mental illness runs on the father's side of the family. Although his UDS was positive for Benzodiazepine & THC, he denies any use of these substances. Collateral information obtained. See the notes below.   Collateral information obtained & provided by patient's mother Derrick Joseph via the phone: [phone]. Patient's mother reports, Derrick Joseph is currently involved in some manic episodes. I observed these symptoms this past Saturday. He has been dealing with manic episodes since he was 24 years old due to bipolar disorder. He was receiving monthly Invega  shots when he was living with me. He was doing well on this medicine until he moved out of my home & moved  in with his girlfriend. I think he stopped getting his shots & resumed weed smoking. This substance causes him to have mania. Also stress from work/relationship may have threw him off mentally. I know about 4 days ago, he informed me that he moved out of his girlfriend's home & had moved out his belongings from where he was living with his girlfriend's home. The new roommate happens to his co-worker. That is how much I  know right now. Can he get back on the invega  shots again? He did very well on this medicine.   And because patient's mother reports that patient did very well on Invega  injectable. Olanzapine  Zydis discontinued. Patient is restarted on Invega  tabs, hopefully will transition to the Invega  injectable format by discharge. He was receiving mental health treatment on an outpatient basis at [facility].  Associated Signs/Symptoms:  Depression Symptoms:  insomnia, psychomotor agitation, anxiety,  (Hypo) Manic Symptoms:  Delusions, Distractibility, Elevated Mood, Flight of Ideas, Grandiosity, Impulsivity, Labiality of Mood,  Anxiety Symptoms:  Restless.  Psychotic Symptoms:  Presents with delusional thoughts. Patient denies any AVH or paranoia. He does not appear to be responding to any internal stimuli.  PTSD Symptoms:  Unable to assess at this time, patient is highly manic.  Did the patient present with any abnormal findings indicating the need for additional neurological or psychological testing?  No  Total Time spent with patient: 1.5 hours   Past Psychiatric History: Per On chart review: 06/14/2019: Manic Behavior [facility] Emergency Department   Patient presented to the ED with manic-like symptoms x 3 months at the time. Brought to the hospital by mother. Later diagnosed with bipolar 1 disorder that time. Was started on Seroquel  100 mg nightly, lithium 300 mg twice daily, clonazepam 0.5 mg twice daily. Was recommended inpatient psychiatry at that time.   06/15/2019-06/23/2019: Inpatient admission at [facility], under IVC  Scheduled medications lithium carbonate, 300 mg, Oral, BID CC LORazepam , 1 mg, Oral, TID OLANZapine , 15 mg, Oral, QHS valproic acid , 750 mg, Oral, Q12H [facility]    07/13/2019 - Manic Behavior [facility] Emergency Department   Making grandiose statements.  Historically delusional and irritable.   08/19/2020- IVC'd for manic behavior  Is the patient  at risk to self? No.  Has the patient been a risk to self in the past 6 months? No.  Has the patient been a risk to self within the distant past? No.  Is the patient a risk to others? No.  Has the patient been a risk to others in the past 6 months? No.  Has the patient been a risk to others within the distant past? No.   Columbia Scale:  Flowsheet Row Ad[facility] (Current) [facility] from 01/17/2024 in [facility] at [facility] ED from 07/07/2023 in [facility] Emergency Department at [facility]  C-SSRS RISK CATEGORY No Risk No Risk No Risk   Prior Inpatient Therapy: Yes.   If yes, describe: [facility].  Prior Outpatient Therapy: Yes.   If yes, describe: [facility].   Alcohol Screening: Patient refused Alcohol Screening Tool: Yes 1. How often do you have a drink containing alcohol?: Never 2. How many drinks containing alcohol do you have on a typical day when you are drinking?: 1 or 2 3. How often do you have six or more drinks on one occasion?: Never AUDIT-C Score: 0 4. How often during the last year  have you found that you were not able to stop drinking once you had started?: Never 5. How often during the last year have you failed to do what was normally expected from you because of drinking?: Never 6. How often during the last year have you needed a first drink in the morning to get yourself going after a heavy drinking session?: Never 7. How often during the last year have you had a feeling of guilt of remorse after drinking?: Never 8. How often during the last year have you been unable to remember what happened the night before because you had been drinking?: Never 9. Have you or someone else been injured as a result of your drinking?: No 10. Has a relative or friend or a doctor or another health worker been concerned about your drinking or suggested you cut down?:  No Alcohol Use Disorder Identification Test Final Score (AUDIT): 0 Alcohol Brief Interventions/Follow-up: Patient Refused  Substance Abuse History in the last 12 months:  Yes.    Consequences of Substance Abuse: Discussed with patient during this admission evaluation. Medical Consequences:  Liver damage, Possible death by overdose Legal Consequences:  Arrests, jail time, Loss of driving privilege. Family Consequences:  Family discord, divorce and or separation.  Previous Psychotropic Medications: Yes   Psychological Evaluations: Yes   Past Medical History: History reviewed. No pertinent past medical history. History reviewed. No pertinent surgical history.  Family History: History reviewed. No pertinent family history.  Family Psychiatric  History: Per chart review.  Medical: HTN in mother Bipolar disorder: Paternal cousin. Psych Rx: did not report SA/HA: Smokes weed. Substance use: in father, denies SUD in family otherwise  Inpatient psych admission: sister and paternal cousin, patient did not report a psychiatric diagnosis for them. Rehab: did not report  Tobacco Screening: Tobacco Use History[1]  BH Tobacco Counseling     Are you interested in Tobacco Cessation Medications?  No, patient refused Counseled patient on smoking cessation:  Refused/Declined practical counseling Reason Tobacco Screening Not Completed: No value filed.       Social History: Patient reports being single, has no children, employed. Denies any legal or pending charges. Social History   Substance and Sexual Activity  Alcohol Use Not Currently     Social History   Substance and Sexual Activity  Drug Use Not Currently    Additional Social History:  Allergies:  Allergies[2]  Lab Results:  Results for orders placed or performed during the hospital encounter of 01/17/24 (from the past 48 hours)  Comprehensive metabolic panel     Status: Abnormal   Collection Time: 01/17/24 11:05 PM  Result  Value Ref Range   Sodium 139 135 - 145 mmol/L   Potassium 4.0 3.5 - 5.1 mmol/L   Chloride 102 98 - 111 mmol/L   CO2 25 22 - 32 mmol/L   Glucose, Bld 113 (H) 70 - 99 mg/dL    Comment: Glucose reference range applies only to samples taken after fasting for at least 8 hours.   BUN 23 (H) 6 - 20 mg/dL   Creatinine, Ser 8.91 0.61 - 1.24 mg/dL   Calcium 9.6 8.9 - 89.6 mg/dL   Total Protein 7.9 6.5 - 8.1 g/dL   Albumin 4.8 3.5 - 5.0 g/dL   AST 42 (H) 15 - 41 U/L   ALT 26 0 - 44 U/L   Alkaline Phosphatase 76 38 - 126 U/L   Total Bilirubin 0.6 0.0 - 1.2 mg/dL   GFR, Estimated >39 >39 mL/min  Comment: (NOTE) Calculated using the CKD-EPI Creatinine Equation (2021)    Anion gap 13 5 - 15    Comment: Performed at [facility], [address] [place], [place] [address]  Ethanol     Status: None   Collection Time: 01/17/24 11:05 PM  Result Value Ref Range   Alcohol, Ethyl (B) <15 <15 mg/dL    Comment: (NOTE) For medical purposes only. Performed at [facility], [address], [place], [place] [address]   CBC with Differential     Status: None   Collection Time: 01/17/24 11:05 PM  Result Value Ref Range   WBC 9.4 4.0 - 10.5 K/uL   RBC 5.07 4.22 - 5.81 MIL/uL   Hemoglobin 14.3 13.0 - 17.0 g/dL   HCT 56.4 60.9 - 47.9 %   MCV 85.8 80.0 - 100.0 fL   MCH 28.2 26.0 - 34.0 pg   MCHC 32.9 30.0 - 36.0 g/dL   RDW 86.1 88.4 - 84.4 %   Platelets 299 150 - 400 K/uL   nRBC 0.0 0.0 - 0.2 %   Neutrophils Relative % 67 %   Neutro Abs 6.3 1.7 - 7.7 K/uL   Lymphocytes Relative 23 %   Lymphs Abs 2.1 0.7 - 4.0 K/uL   Monocytes Relative 9 %   Monocytes Absolute 0.8 0.1 - 1.0 K/uL   Eosinophils Relative 1 %   Eosinophils Absolute 0.1 0.0 - 0.5 K/uL   Basophils Relative 0 %   Basophils Absolute 0.0 0.0 - 0.1 K/uL   Immature Granulocytes 0 %   Abs Immature Granulocytes 0.03 0.00 - 0.07 K/uL    Comment: Performed at [facility], [address] [place], [place] [address]   Salicylate level     Status: Abnormal   Collection Time: 01/17/24 11:05 PM  Result Value Ref Range   Salicylate Lvl <7.0 (L) 7.0 - 30.0 mg/dL    Comment: Performed at [facility], [address], [place], [place] [address]  Acetaminophen  level     Status: Abnormal   Collection Time: 01/17/24 11:05 PM  Result Value Ref Range   Acetaminophen  (Tylenol ), Serum <10 (L) 10 - 30 ug/mL    Comment: (NOTE) Toxic concentrations can be more effectively related to post dose interval; >200, >100, and >50 ug/mL serum concentrations correspond to toxic concentrations at 4, 8, and 12 hours post dose, respectively.  Performed at [facility], [address], [place], [place] [address]   Urine rapid drug screen (hosp performed)     Status: Abnormal   Collection Time: 01/18/24  6:05 AM  Result Value Ref Range   Opiates NEGATIVE NEGATIVE   Cocaine NEGATIVE NEGATIVE   Benzodiazepines POSITIVE (A) NEGATIVE   Amphetamines NEGATIVE NEGATIVE   Tetrahydrocannabinol POSITIVE (A) NEGATIVE   Barbiturates NEGATIVE NEGATIVE   Methadone Scn, Ur NEGATIVE NEGATIVE   Fentanyl  NEGATIVE NEGATIVE    Comment: (NOTE) Drug screen is for Medical Purposes only. Positive results are preliminary only. If confirmation is needed, notify lab within 5 days.  Drug Class                 Cutoff (ng/mL) Amphetamine and metabolites 1000 Barbiturate and metabolites 200 Benzodiazepine              200 Opiates and metabolites     300 Cocaine and metabolites     300 THC  50 Fentanyl                     5 Methadone                   300  Trazodone  is metabolized in vivo to several metabolites,  including pharmacologically active m-CPP, which is excreted in the  urine.  Immunoassay screens for amphetamines and MDMA have potential  cross-reactivity with these compounds and may provide false positive  result.  Performed at [facility], [address], [place], [place] [address]     Blood Alcohol level:  Lab Results  Component Value Date   [facility] <15 01/17/2024   ETH <10 05/05/2022   Metabolic Disorder Labs:  Lab Results  Component Value Date   HGBA1C 5.6 03/10/2022   MPG 114 03/10/2022   No results found for: PROLACTIN Lab Results  Component Value Date   CHOL 106 03/11/2022   TRIG 52 03/11/2022   HDL 38 (L) 03/11/2022   CHOLHDL 2.8 03/11/2022   VLDL 10 03/11/2022   LDLCALC 58 03/11/2022   Current Medications: Current Facility-Administered Medications  Medication Dose Route Frequency Provider Last Rate Last Admin   acetaminophen  (TYLENOL ) tablet 650 mg  650 mg Oral Q6H PRN Coleman, Carolyn H, NP       alum & mag hydroxide-simeth (MAALOX/MYLANTA) 200-200-20 MG/5ML suspension 30 mL  30 mL Oral Q4H PRN Coleman, Carolyn H, NP       haloperidol  (HALDOL ) tablet 5 mg  5 mg Oral TID PRN Coleman, Carolyn H, NP   5 mg at 01/18/24 1726   And   diphenhydrAMINE  (BENADRYL ) capsule 50 mg  50 mg Oral TID PRN Coleman, Carolyn H, NP   50 mg at 01/18/24 1726   haloperidol  lactate (HALDOL ) injection 5 mg  5 mg Intramuscular TID PRN Mardy Elveria DEL, NP       And   diphenhydrAMINE  (BENADRYL ) injection 50 mg  50 mg Intramuscular TID PRN Mardy Elveria DEL, NP       And   LORazepam  (ATIVAN ) injection 2 mg  2 mg Intramuscular TID PRN Mardy Elveria DEL, NP       haloperidol  lactate (HALDOL ) injection 10 mg  10 mg Intramuscular TID PRN Mardy Elveria DEL, NP       And   diphenhydrAMINE  (BENADRYL ) injection 50 mg  50 mg Intramuscular TID PRN Mardy Elveria DEL, NP       And   LORazepam  (ATIVAN ) injection 2 mg  2 mg Intramuscular TID PRN Coleman, Carolyn H, NP       divalproex  (DEPAKOTE ) DR tablet 250 mg  250 mg Oral Q12H Shiva Karis, Mac I, NP       hydrOXYzine  (ATARAX ) tablet 25 mg  25 mg Oral TID PRN Coleman, Carolyn H, NP   25 mg at 01/19/24 1130   magnesium  hydroxide (MILK OF MAGNESIA) suspension 30 mL  30 mL Oral Daily PRN Coleman, Carolyn H, NP       nicotine  polacrilex  (NICORETTE ) gum 2 mg  2 mg Oral PRN Pashayan, Alexander S, DO   2 mg at 01/19/24 1130   paliperidone  (INVEGA ) 24 hr tablet 1.5 mg  1.5 mg Oral Once Raniya Golembeski I, NP       [START ON 01/20/2024] paliperidone  (INVEGA ) 24 hr tablet 3 mg  3 mg Oral Daily Lane Kjos I, NP       traZODone  (DESYREL ) tablet 100 mg  100 mg Oral QHS Deseray Daponte I, NP  PTA Medications: No medications prior to admission.    AIMS:  ,  ,  ,  ,  ,  ,    Musculoskeletal: Strength & Muscle Tone: within normal limits Gait & Station: normal Patient leans: N/A   Psychiatric Specialty Exam: Presentation  General Appearance: Casual; Fairly Groomed  Eye Contact:Fair  Speech:Clear and Coherent; Pressured  Speech Volume:Increased  Handedness:Right   Mood and Affect  Mood:Labile  Affect:Congruent; Labile   Thought Process  Thought Processes:Disorganized  Duration of Psychotic Symptoms:N/A  Past Diagnosis of Schizophrenia or Psychoactive disorder: NA Descriptions of Associations:Tangential  Orientation:Full (Time, Place and Person)  Thought Content:Tangential; Scattered  Hallucinations:Hallucinations: -- (Patient denies.)  Ideas of Reference: Delusions (Says he is working a job he is being paid a thousand dollars a week. Says has multiple jobs & has more money than anyone in this hospital.)  Suicidal Thoughts:Suicidal Thoughts: No  Homicidal Thoughts:Homicidal Thoughts: No  Sensorium  Memory:Immediate Fair; Recent Fair; Remote Fair  Judgment:Impaired  Insight:Lacking  Executive Functions  Concentration:Poor  Attention Span:Poor  Recall:Fair  Fund of Knowledge:Poor  Language:Fair  Psychomotor Activity  Psychomotor Activity:Psychomotor Activity: Restlessness   Assets  Assets:Desire for Improvement; Housing; Physical Health; Resilience; Social Support  Sleep  Sleep:Sleep: Fair  Estimated Sleeping Duration (Last 24 Hours): 3.25-4.25 hours  Physical Exam: Physical  Exam Vitals and nursing note reviewed.  HENT:     Head: Normocephalic.     Nose: Nose normal.     Mouth/Throat:     Pharynx: Oropharynx is clear.  Cardiovascular:     Rate and Rhythm: Normal rate.     Pulses: Normal pulses.  Pulmonary:     Effort: Pulmonary effort is normal.  Genitourinary:    Comments: Deferred. Musculoskeletal:        General: Normal range of motion.     Cervical back: Normal range of motion.  Skin:    General: Skin is dry.  Neurological:     General: No focal deficit present.     Mental Status: He is alert and oriented to person, place, and time.    Review of Systems  Constitutional:  Negative for chills, diaphoresis and fever.  HENT:  Negative for congestion and sore throat.   Respiratory:  Negative for cough, shortness of breath and wheezing.   Cardiovascular:  Negative for chest pain and palpitations.  Gastrointestinal:  Negative for abdominal pain, constipation, diarrhea, heartburn, nausea and vomiting.  Genitourinary:  Negative for dysuria.  Musculoskeletal:  Negative for joint pain and myalgias.  Neurological:  Negative for dizziness, tingling, tremors, sensory change, speech change, focal weakness, seizures, loss of consciousness, weakness and headaches.  Endo/Heme/Allergies:        NKDA.  Psychiatric/Behavioral:  Positive for hallucinations and substance abuse (UDS (+) for benzo & THC.). Negative for memory loss. The patient is nervous/anxious and has insomnia.    Blood pressure 123/85, pulse 65, temperature 97.8 F (36.6 C), temperature source Oral, resp. rate 16, height 5' 7 (1.702 m), weight 56.7 kg, SpO2 100%. Body mass index is 19.58 kg/m.  Treatment Plan Summary: Daily contact with patient to assess and evaluate symptoms and progress in treatment and Medication management.   Principal/active diagnoses.  Bipolar disorder, current episode manic without psychotic features (HCC) Cannabis use disorder, moderate, in controlled environment  (HCC) Bipolar 1 disorder (HCC)  Plan: The risks/benefits/side-effects/alternatives to the medications in use were discussed in detail with the patient and time was given for patient's questions. The patient consents to medication trial.   -  Discontinued Zyprexa  zydis.  -Initiated Invega  1.5 mg once po today for mood control.  -Continue Invega  3 mg po q daily starting tomorrow 01-20-24 for mood control.  -Initiated Depakote  DR. 250 mg po twice daily for manic symptoms.  -Increased Trazodone  from 50 mg to 100 mg routinely for insomnia.  -Continue Hydroxyzine  25 mg po tid prn for anxiety.   Labs to be obtained: TSH & hgba1c tomorrow morning 01-20-24.                                      Valproic acid  level on 01-23-24.  Agitation protocols.  -Continue as recommended (see MAR).   Other PRNS -Continue Tylenol  650 mg every 6 hours PRN for mild pain -Continue Maalox 30 ml Q 4 hrs PRN for indigestion -Continue MOM 30 ml po Q 6 hrs for constipation  Safety and Monitoring: Voluntary admission to inpatient psychiatric unit for safety, stabilization and treatment Daily contact with patient to assess and evaluate symptoms and progress in treatment Patient's case to be discussed in multi-disciplinary team meeting Observation Level : q15 minute checks Vital signs: q12 hours Precautions: Safety  Discharge Planning: Social work and case management to assist with discharge planning and identification of hospital follow-up needs prior to discharge Estimated LOS: 5-7 days Discharge Concerns: Need to establish a safety plan; Medication compliance and effectiveness Discharge Goals: Return home with outpatient referrals for mental health follow-up including medication management/psychotherapy  Observation Level/Precautions:  15 minute checks  Laboratory:  Per ED. Current lab results reviewed. Will obtain TSH, hgba1c & lipid panel.  Psychotherapy: Enrolled in the group sessions.  Medications: See MAR.   Consultations: As needed.    Discharge Concerns: Safety, mood stability.  Estimated LOS: 7 days.  Other: NA   Physician Treatment Plan for Primary Diagnosis: Bipolar disorder, current episode manic without psychotic features (HCC) Long Term Goal(s): Improvement in symptoms so as ready for discharge  Short Term Goals: Ability to identify changes in lifestyle to reduce recurrence of condition will improve, Ability to verbalize feelings will improve, Ability to disclose and discuss suicidal ideas, and Ability to demonstrate self-control will improve  Physician Treatment Plan for Secondary Diagnosis: Principal Problem:   Bipolar disorder, current episode manic without psychotic features (HCC) Active Problems:   Cannabis use disorder, moderate, in controlled environment (HCC)   Bipolar 1 disorder (HCC)  Long Term Goal(s): Improvement in symptoms so as ready for discharge  Short Term Goals: Ability to identify and develop effective coping behaviors will improve, Ability to maintain clinical measurements within normal limits will improve, Compliance with prescribed medications will improve, and Ability to identify triggers associated with substance abuse/mental health issues will improve  I certify that inpatient services furnished can reasonably be expected to improve the patient's condition.    Mac Bolster, NP, pmhnp, fnp-bc. 1/14/202612:15 PM     [1]  Social History Tobacco Use  Smoking Status Every Day   Types: Cigarettes   Passive exposure: Current  Smokeless Tobacco Never  [2] No Known Allergies  "

## 2024-01-19 NOTE — Telephone Encounter (Signed)
 Pharmacy Patient Advocate Encounter  Insurance verification completed.    The patient is insured through [employer] [place].     Ran test claim for Invega  Sustenna 234mg  and the current 30 day co-pay is $4.   This test claim was processed through [employer]- copay amounts may vary at other pharmacies due to [employer], or as the patient moves through the different stages of their insurance plan.

## 2024-01-19 NOTE — Progress Notes (Signed)
 D: Patient is alert, manic, delusional, preoccupied, pleasant, and cooperative. Denies SI, HI, AVH, and verbally contracts for safety. Patient reports he slept good last night with sleeping medication. Patient reports his appetite as good, energy level as normal, and concentration as good. Patient rates his depression 0/10, hopelessness 0/10, and anxiety 0/10. Patient denies physical symptoms/pain.    A: Scheduled medications administered per MD order. PRN hydroxyzine  and nicotine  gum administered. Support provided. Patient educated on safety on the unit and medications. Routine safety checks every 15 minutes. Patient stated understanding to tell nurse about any new physical symptoms. Patient understands to tell staff of any needs.     R: No adverse drug reactions noted. Patient remains safe at this time and will continue to monitor.    01/19/24 0900  Psych Admission Type (Psych Patients Only)  Admission Status Involuntary  Psychosocial Assessment  Patient Complaints Hyperactivity  Eye Contact Fair  Facial Expression Animated  Affect Preoccupied  Speech Rapid;Pressured  Interaction Intrusive  Motor Activity Fidgety;Restless  Appearance/Hygiene Unremarkable  Behavior Characteristics Fidgety;Hyperactive  Mood Preoccupied;Pleasant  Thought Process  Coherency Disorganized  Content Religiosity;Preoccupation  Delusions Grandeur;Paranoid  Perception Derealization  Hallucination None reported or observed  Judgment Limited  Confusion None  Danger to Self  Current suicidal ideation? Denies  Danger to Others  Danger to Others None reported or observed
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
## 2024-01-19 NOTE — Group Note (Signed)
 Date:  01/19/2024 Time:  8:29 PM  Group Topic/Focus:  Wrap-Up Group:   The focus of this group is to help patients review their daily goal of treatment and discuss progress on daily workbooks.    Participation Level:  Active  Participation Quality:  Appropriate  Affect:  Appropriate  Cognitive:  Appropriate  Insight: Appropriate  Engagement in Group:  Engaged  Modes of Intervention:  Discussion  Additional Comments:  Pt rated his day as a 10/10, reporting some irritation but overall a good day. Pt stated that his family and faith in god help keep him motivated.  Marquette GORMAN Dover 01/19/2024, 8:29 PM

## 2024-01-20 LAB — HEMOGLOBIN A1C
Hgb A1c MFr Bld: 5.4 % (ref 4.8–5.6)
Mean Plasma Glucose: 108.28 mg/dL

## 2024-01-20 LAB — TSH: TSH: 0.748 u[IU]/mL (ref 0.350–4.500)

## 2024-01-20 MED ORDER — PALIPERIDONE ER 6 MG PO TB24
6.0000 mg | ORAL_TABLET | Freq: Every day | ORAL | Status: DC
  Administered 2024-01-21 – 2024-01-26 (×6): 6 mg via ORAL
  Filled 2024-01-20 (×7): qty 1

## 2024-01-20 NOTE — [facility] Suicide Risk Assessment (Signed)
 [facility] INPATIENT:  Family/Significant Other Suicide Prevention Education  Suicide Prevention Education:  Education Completed; Grayce Rosaline Bohr, (mother), [phone],  has been identified by the patient as the family member/significant other with whom the patient will be residing, and identified as the person(s) who will aid the patient in the event of a mental health crisis (suicidal ideations/suicide attempt).  With written consent from the patient, the family member/significant other has been provided the following suicide prevention education, prior to the and/or following the discharge of the patient.  The suicide prevention education provided includes the following: Suicide risk factors Suicide prevention and interventions National Suicide Hotline telephone number [facility] assessment telephone number [facility] Emergency Assistance 911 [facility] and/or Residential Mobile Crisis Unit telephone number  Request made of family/significant other to: Remove weapons (e.g., guns, rifles, knives), all items previously/currently identified as safety concern.   Remove drugs/medications (over-the-counter, prescriptions, illicit drugs), all items previously/currently identified as a safety concern.  The family member/significant other verbalizes understanding of the suicide prevention education information provided.  The family member/significant other agrees to remove the items of safety concern listed above.  Roselyn GORMAN Lento 01/20/2024, 11:39 AM

## 2024-01-20 NOTE — [facility] Group Notes (Signed)
 Adult Psychoeducational Group Note  Date:  01/20/2024 Time:  9:55 AM  Group Topic/Focus:  Goals Group:   The focus of this group is to help patients establish daily goals to achieve during treatment and discuss how the patient can incorporate goal setting into their daily lives to aide in recovery. Orientation:   The focus of this group is to educate the patient on the purpose and policies of crisis stabilization and provide a format to answer questions about their admission.  The group details unit policies and expectations of patients while admitted.  Participation Level:  Active  Participation Quality:  Appropriate  Affect:  Appropriate  Cognitive:  Appropriate  Insight: Appropriate  Engagement in Group:  Engaged  Modes of Intervention:  Discussion  Additional Comments:  Pt attended the goals group and remained appropriate and engaged throughout the duration of the group.   Kiondra Caicedo O 01/20/2024, 9:55 AM

## 2024-01-20 NOTE — Progress Notes (Signed)
 [facility] MD Progress Note  01/20/2024 10:29 AM Derrick Joseph  MRN:  [id number]  Reason for admission: 24 year old AA male with hx of mental illness (bipolar disorder) & cannabis use disorder. Admitted to the [facility] from the [facility] hospital with complaint of erratic behaviors (shouting and claiming that he owns much of the city and hospital. He claims to be communicating with God and apparently today tried to jump out of his mother's car when driving for help.  According to the IVC paperwork, he was also wearing a mask & talking about killing people).  A review of his current lab results has shown his UDS was positive for Benzodiazepine & THC). After medical evaluation & clearance at the [facility] ED, patient was recommended & transferred to the [facility] for inpatient psychiatric admission for psychiatric evaluation/treatments.   Daily notes: Derrick Joseph is seen this morning. Chart reviewed. The chart findings discussed with the treatment team. He presents alert, oriented  & aware of situation. He is visible on the unit, attending group sessions. He presents still manic, however, seems to be mellowing some. He is still talkative with pressured speech, however, redirectable. He reports, I'm doing okay this morning. I'm taking it one day at a time. I'm here to get my mind right. I feel like I'm on a mini vacation too because my mother called my boss at my work & told him that I'm in the hospital getting mental health checked & my boss said it is okay. So, I still have my job waiting for me once I get out of here. I make a thousand dollars a week doing this job. I slept so well last night that I had to be woken up by the staff this morning. I feel so relaxed. I'm not depressed or anxious. I'm a happy person. I stay positive & I like to keep a positive energy around me. My appetite is good, I just need more food. Can I have a double portions of food? I took my medicines this morning. I believe that they are helping.  Derrick Joseph currently denies any SIHI, AVH, delusional thoughts or paranoia. As stated above, he remains manic, talkative & grandiose. He is more redirectable today than during his admission time. He is taking & tolerating his treatment regimen. He denies any side effects. Patient is informed that we are gradually increasing the dose of his Invega  as he has not been taken it for several months & the plan is to transition him to the monthly injectable form. Patient is very receptive of this information. He currently denies any SIHI, AVH, delusional thoughts or paranoia. He does not appear to be responding to any internal stimuli. Reviewed current lab results, TSH within normal. The HGBa1c result still pending. Valproic acid  level to be obtained on 01-23-24. Vital signs remain intact.  Principal Problem: Bipolar disorder, current episode manic without psychotic features (HCC)  Diagnosis: Principal Problem:   Bipolar disorder, current episode manic without psychotic features (HCC) Active Problems:   Cannabis use disorder, moderate, in controlled environment (HCC)   Bipolar 1 disorder (HCC)  Total Time spent with patient: 45 minutes  Past Psychiatric History: Bipolar disorder, manic episodes.  Past Medical History: History reviewed. No pertinent past medical history. History reviewed. No pertinent surgical history.  Family History: History reviewed. No pertinent family history.  Family Psychiatric  History: See H&P.  Social History:  Social History   Substance and Sexual Activity  Alcohol Use Not Currently  Social History   Substance and Sexual Activity  Drug Use Not Currently    Social History   Socioeconomic History   Marital status: Single    Spouse name: Not on file   Number of children: Not on file   Years of education: Not on file   Highest education level: Not on file  Occupational History   Not on file  Tobacco Use   Smoking status: Every Day    Types: Cigarettes     Passive exposure: Current   Smokeless tobacco: Never  Vaping Use   Vaping status: Every Day   Substances: Nicotine , Flavoring  Substance and Sexual Activity   Alcohol use: Not Currently   Drug use: Not Currently   Sexual activity: Yes    Birth control/protection: Condom  Other Topics Concern   Not on file  Social History Narrative   Not on file   Social Drivers of Health   Tobacco Use: High Risk (01/18/2024)   Patient History    Smoking Tobacco Use: Every Day    Smokeless Tobacco Use: Never    Passive Exposure: Current  Financial Resource Strain: Not on file  Food Insecurity: No Food Insecurity (01/18/2024)   Epic    Worried About Programme Researcher, Broadcasting/film/video in the Last Year: Never true    Ran Out of Food in the Last Year: Never true  Transportation Needs: No Transportation Needs (01/18/2024)   Epic    Lack of Transportation (Medical): No    Lack of Transportation (Non-Medical): No  Physical Activity: Not on file  Stress: Not on file  Social Connections: Not on file  Depression (PHQ2-9): Low Risk (08/18/2022)   Depression (PHQ2-9)    PHQ-2 Score: 0  Alcohol Screen: Low Risk (01/18/2024)   Alcohol Screen    Last Alcohol Screening Score (AUDIT): 0  Housing: Low Risk (01/18/2024)   Epic    Unable to Pay for Housing in the Last Year: No    Number of Times Moved in the Last Year: 0    Homeless in the Last Year: No  Utilities: Not At Risk (01/18/2024)   Epic    Threatened with loss of utilities: No  Health Literacy: Not on file   Additional Social History:   Sleep: Good Estimated Sleeping Duration (Last 24 Hours): 6.25-6.75 hours  Appetite:  Good  Current Medications: Current Facility-Administered Medications  Medication Dose Route Frequency Provider Last Rate Last Admin   acetaminophen  (TYLENOL ) tablet 650 mg  650 mg Oral Q6H PRN Coleman, Carolyn H, NP       alum & mag hydroxide-simeth (MAALOX/MYLANTA) 200-200-20 MG/5ML suspension 30 mL  30 mL Oral Q4H PRN Coleman, Carolyn  H, NP       haloperidol  (HALDOL ) tablet 5 mg  5 mg Oral TID PRN Coleman, Carolyn H, NP   5 mg at 01/19/24 1748   And   diphenhydrAMINE  (BENADRYL ) capsule 50 mg  50 mg Oral TID PRN Coleman, Carolyn H, NP   50 mg at 01/19/24 1748   haloperidol  lactate (HALDOL ) injection 5 mg  5 mg Intramuscular TID PRN Mardy Elveria DEL, NP       And   diphenhydrAMINE  (BENADRYL ) injection 50 mg  50 mg Intramuscular TID PRN Mardy Elveria DEL, NP       And   LORazepam  (ATIVAN ) injection 2 mg  2 mg Intramuscular TID PRN Coleman, Carolyn H, NP       haloperidol  lactate (HALDOL ) injection 10 mg  10 mg Intramuscular TID PRN  Mardy Elveria DEL, NP       And   diphenhydrAMINE  (BENADRYL ) injection 50 mg  50 mg Intramuscular TID PRN Mardy Elveria DEL, NP       And   LORazepam  (ATIVAN ) injection 2 mg  2 mg Intramuscular TID PRN Coleman, Carolyn H, NP       divalproex  (DEPAKOTE ) DR tablet 250 mg  250 mg Oral Q12H Katelyne Galster I, NP   250 mg at 01/20/24 0813   hydrOXYzine  (ATARAX ) tablet 25 mg  25 mg Oral TID PRN Coleman, Carolyn H, NP   25 mg at 01/19/24 1130   magnesium  hydroxide (MILK OF MAGNESIA) suspension 30 mL  30 mL Oral Daily PRN Coleman, Carolyn H, NP       nicotine  polacrilex (NICORETTE ) gum 2 mg  2 mg Oral PRN Pashayan, Alexander S, DO   2 mg at 01/20/24 0813   paliperidone  (INVEGA ) 24 hr tablet 3 mg  3 mg Oral Daily Vee Bahe, Mac I, NP   3 mg at 01/20/24 0813   traZODone  (DESYREL ) tablet 100 mg  100 mg Oral QHS Collene Mac I, NP   100 mg at 01/19/24 2102   Lab Results:  Results for orders placed or performed during the hospital encounter of 01/18/24 (from the past 48 hours)  TSH     Status: None   Collection Time: 01/20/24  6:24 AM  Result Value Ref Range   TSH 0.748 0.350 - 4.500 uIU/mL    Comment: Performed at [facility], [address], [place], [place] [address]   Blood Alcohol level:  Lab Results  Component Value Date   [facility] <15 01/17/2024   ETH <10 05/05/2022   Metabolic  Disorder Labs: Lab Results  Component Value Date   HGBA1C 5.6 03/10/2022   MPG 114 03/10/2022   No results found for: PROLACTIN Lab Results  Component Value Date   CHOL 106 03/11/2022   TRIG 52 03/11/2022   HDL 38 (L) 03/11/2022   CHOLHDL 2.8 03/11/2022   VLDL 10 03/11/2022   LDLCALC 58 03/11/2022   Physical Findings: AIMS:  ,  ,  ,  ,  ,  ,   CIWA:    COWS:     Musculoskeletal: Strength & Muscle Tone: within normal limits Gait & Station: normal Patient leans: N/A  Psychiatric Specialty Exam:  Presentation  General Appearance:  Casual; Fairly Groomed  Eye Contact: Fair  Speech: Clear and Coherent; Pressured  Speech Volume: Increased  Handedness: Right   Mood and Affect  Mood: Labile  Affect: Congruent; Labile  Thought Process  Thought Processes: Disorganized  Descriptions of Associations:Tangential  Orientation:Full (Time, Place and Person)  Thought Content:Tangential; Scattered  History of Schizophrenia/Schizoaffective disorder: NA.  Duration of Psychotic Symptoms: NA  Hallucinations:Hallucinations: -- (Patient denies.)  Ideas of Reference:Delusions (Says he is working a job he is being paid a thousand dollars a week. Says has multiple jobs & has more money than anyone in this hospital.)  Suicidal Thoughts:Suicidal Thoughts: No  Homicidal Thoughts:Homicidal Thoughts: No  Sensorium  Memory: Immediate Fair; Recent Fair; Remote Fair  Judgment: Impaired  Insight: Lacking   Executive Functions  Concentration: Poor  Attention Span: Poor  Recall: Fair  Fund of Knowledge: Poor  Language: Fair  Psychomotor Activity  Psychomotor Activity: Psychomotor Activity: Restlessness  Assets  Assets: Desire for Improvement; Housing; Physical Health; Resilience; Social Support  Sleep  Sleep: Sleep: Fair Number of Hours of Sleep: 5.5  Physical Exam: Physical Exam Vitals and  nursing note reviewed.  HENT:     Head:  Normocephalic.     Nose: Nose normal.     Mouth/Throat:     Pharynx: Oropharynx is clear.  Cardiovascular:     Rate and Rhythm: Normal rate.     Pulses: Normal pulses.  Pulmonary:     Effort: Pulmonary effort is normal.  Genitourinary:    Comments: Deferred. Musculoskeletal:        General: Normal range of motion.     Cervical back: Normal range of motion.  Skin:    General: Skin is dry.  Neurological:     General: No focal deficit present.     Mental Status: He is alert and oriented to person, place, and time.    Review of Systems  Constitutional:  Negative for chills, diaphoresis and fever.  HENT:  Negative for congestion and sore throat.   Eyes:  Negative for blurred vision.  Respiratory:  Negative for cough, shortness of breath and wheezing.   Cardiovascular:  Negative for chest pain and palpitations.  Gastrointestinal:  Negative for abdominal pain, constipation, diarrhea, heartburn, nausea and vomiting.  Genitourinary:  Negative for dysuria.  Musculoskeletal:  Negative for joint pain and myalgias.  Skin:  Negative for itching and rash.  Neurological:  Negative for dizziness, tingling, tremors, sensory change, speech change, focal weakness, seizures, loss of consciousness, weakness and headaches.  Endo/Heme/Allergies:        NKDA.  Psychiatric/Behavioral:  Positive for substance abuse (UDS (+) for THC & benzo.). Negative for depression, hallucinations, memory loss and suicidal ideas. The patient is nervous/anxious. The patient does not have insomnia.    Blood pressure 126/79, pulse 64, temperature 97.8 F (36.6 C), temperature source Oral, resp. rate 16, height 5' 7 (1.702 m), weight 56.7 kg, SpO2 100%. Body mass index is 19.58 kg/m.  Treatment Plan Summary: Daily contact with patient to assess and evaluate symptoms and progress in treatment and Medication management.   Principal/active diagnoses.  Bipolar disorder, current episode manic without psychotic features  (HCC) Cannabis use disorder, moderate, in controlled environment (HCC) Bipolar 1 disorder (HCC)  Plan: The risks/benefits/side-effects/alternatives to the medications in use were discussed in detail with the patient and time was given for patient's questions. The patient consents to medication trial.    -Discontinued Zyprexa  zydis.  -Completed Invega  1.5 mg once po today for mood control.  -Increased Invega  from 3 mg to 6 mg po daily for mood control.  -Continue Depakote  DR. 250 mg po twice daily for manic symptoms.  -Continue Trazodone  100 mg routinely for insomnia.  -Continue Hydroxyzine  25 mg po tid prn for anxiety.    Labs to be obtained: Reviewed new lab results, TSH within norm. Pending result for  hgba1c.                                       Valproic acid  level on 01-23-24.   Agitation protocols.  -Continue as recommended (see MAR).    Other PRNS -Continue Tylenol  650 mg every 6 hours PRN for mild pain -Continue Maalox 30 ml Q 4 hrs PRN for indigestion -Continue MOM 30 ml po Q 6 hrs for constipation   Safety and Monitoring: Voluntary admission to inpatient psychiatric unit for safety, stabilization and treatment Daily contact with patient to assess and evaluate symptoms and progress in treatment Patient's case to be discussed in multi-disciplinary team meeting Observation Level :  q15 minute checks Vital signs: q12 hours Precautions: Safety   Discharge Planning: Social work and case management to assist with discharge planning and identification of hospital follow-up needs prior to discharge Estimated LOS: 5-7 days Discharge Concerns: Need to establish a safety plan; Medication compliance and effectiveness Discharge Goals: Return home with outpatient referrals for mental health follow-up including medication management/psychotherapy  Mac Bolster, NP, pmhnp, fnp-bc. 01/20/2024, 10:29 AM

## 2024-01-20 NOTE — Group Note (Signed)
 Recreation Therapy Group Note   Group Topic:Leisure Education  Group Date: 01/20/2024 Start Time: 1009 End Time: 1034 Facilitators: Mava Suares-McCall, LRT,CTRS Location: [address] Dayroom   Group Topic: Leisure Education   Goal Area(s) Addresses:  Patient will successfully identify positive leisure and recreation activities.  Patient will acknowledge benefits of participation in healthy leisure activities post discharge.  Patient will actively work with peers toward a shared goal.   Behavioral Response: Engaged   Intervention: Competitive Group Game    Activity: It Trainer. In groups of 5-7, patients took turns trying to guess the picture being drawn on the board by their teammate.  If the team guessed the correct answer, they won a point.  If the team guessed wrong, the other team got a chance to steal the point. After several rounds of game play, the team with the most points were declared winners. Post-activity discussion reviewed benefits of positive recreation outlets: reducing stress, improving coping mechanisms, increasing self-esteem, and building larger support systems.   Education:  Teacher, English As A Foreign Language, Leisure as [employer]chant [employer] Officer, Programmer, Applications, Building Control Surveyor   Education Outcome: Acknowledges education/In group clarification offered/Needs additional education   Affect/Mood: Appropriate   Participation Level: Engaged   Participation Quality: Independent   Behavior: Appropriate   Speech/Thought Process: Focused   Insight: Good   Judgement: Good   Modes of Intervention: Competitive Play   Patient Response to Interventions:  Engaged   Education Outcome:  In group clarification offered    Clinical Observations/Individualized Feedback: Pt was bright and engaged. Pt wasn't as talkative as in previous group. Pt was able to focus and participate in the activity. Pt was able to get the most correct answers during the activity. Pt was appropriate in  group.   Plan: Continue to engage patient in RT group sessions 2-3x/week.   Ciria Bernardini-McCall, LRT,CTRS 01/20/2024 11:17 AM

## 2024-01-20 NOTE — Progress Notes (Signed)
(  Sleep Hours) -7.25 (Any PRNs that were needed, meds refused, or side effects to meds)- Nicorette  gum (Any disturbances and when (visitation, over night)-none (Concerns raised by the patient)- none (SI/HI/AVH)-denied

## 2024-01-20 NOTE — BHH Group Notes (Signed)
 Adult Psychoeducational Group Note  Date:  01/20/2024 Time:  4:04 PM  Group Topic/Focus: MHA Group  Participation Level:  Active  Participation Quality:    Affect:    Cognitive:    Insight:   Engagement in Group:    Modes of Intervention:    Additional Comments:    Derrick Joseph O 01/20/2024, 4:04 PM

## 2024-01-20 NOTE — BHH Suicide Risk Assessment (Signed)
 The LCSWA contacted Grayce Rosaline Bohr (mother), [phone] to provide SPI.  The patient mom stated that the patient is allowed to come and stay with her and she encourages him to but the patient does not want to.   Mom reported that the patient needs to be stable and on correct mediations.  Mom reported that she dont know if the patient would have access to any guns or weapons because she has never been to his house and don't have access to his house.   Mom stated that things are covered for the patient at work.  Mom stated that the patient misplaced his car and refuses to give her the keys.   Mom would like a clear plan in place before the patient is discharged.    Roselyn Lento, MSW, LCSWA

## 2024-01-20 NOTE — Plan of Care (Signed)

## 2024-01-20 NOTE — Plan of Care (Signed)
  Problem: Education: Goal: Mental status will improve Outcome: Progressing   

## 2024-01-20 NOTE — BHH Group Notes (Signed)
 Adult Psychoeducational Group Note  Date:  01/20/2024 Time:  11:49 AM  Group Topic/Focus: Nutrition Group  Participation Level:  Did Not Attend  Participation Quality:    Affect:    Cognitive:    Insight:   Engagement in Group:    Modes of Intervention:    Additional Comments:    Jamelle Cassondra KIDD 01/20/2024, 11:49 AM

## 2024-01-20 NOTE — Progress Notes (Signed)
 D: Patient is alert, manic, delusional, intrusive, pleasant, and mostly cooperative. Denies SI, HI, AVH, and verbally contracts for safety. Patient reports he slept good last night with sleeping medication. Patient reports his appetite as good, energy level as normal, and concentration as good. Patient rates his depression 0/10, hopelessness 0/10, and anxiety 0/10. Patient denies physical symptoms/pain.    A: Scheduled medications administered per MD order. PRN nicotine  gum administered. Support provided. Patient educated on safety on the unit and medications. Routine safety checks every 15 minutes. Patient stated understanding to tell nurse about any new physical symptoms. Patient understands to tell staff of any needs.     R: No adverse drug reactions noted. Patient remains safe at this time and will continue to monitor.    01/20/24 0900  Psych Admission Type (Psych Patients Only)  Admission Status Involuntary  Psychosocial Assessment  Patient Complaints Hyperactivity  Eye Contact Fair  Facial Expression Animated  Affect Labile;Preoccupied  Speech Rapid;Pressured  Interaction Intrusive  Motor Activity Fidgety  Appearance/Hygiene Unremarkable  Behavior Characteristics Hyperactive;Fidgety  Mood Preoccupied;Pleasant  Thought Process  Coherency Disorganized  Content Delusions;Magical thinking  Delusions Grandeur;Paranoid  Perception Derealization  Hallucination None reported or observed  Judgment Poor  Confusion None  Danger to Self  Current suicidal ideation? Denies  Danger to Others  Danger to Others None reported or observed

## 2024-01-20 NOTE — BHH Group Notes (Signed)
 Adult Psychoeducational Group Note  Date:  01/20/2024 Time:  11:48 AM  Group Topic/Focus: Recreation Therapy  Participation Level:  Active  Participation Quality:    Affect:    Cognitive:    Insight:   Engagement in Group:    Modes of Intervention:    Additional Comments:    Laveta Gilkey O 01/20/2024, 11:48 AM

## 2024-01-20 NOTE — Group Note (Signed)
 Date:  01/20/2024 Time:  9:18 PM  Group Topic/Focus:  Wrap-Up Group:   The focus of this group is to help patients review their daily goal of treatment and discuss progress on daily workbooks.    Participation Level:  Active  Participation Quality:  Appropriate  Affect:  Appropriate  Cognitive:  Appropriate  Insight: Appropriate  Engagement in Group:  Engaged  Modes of Intervention:  Discussion  Additional Comments:    Marquette GORMAN [place] 01/20/2024, 9:18 PM

## 2024-01-21 LAB — COMPREHENSIVE METABOLIC PANEL WITH GFR
ALT: 37 U/L (ref 0–44)
AST: 52 U/L — ABNORMAL HIGH (ref 15–41)
Albumin: 4.5 g/dL (ref 3.5–5.0)
Alkaline Phosphatase: 105 U/L (ref 38–126)
Anion gap: 12 (ref 5–15)
BUN: 13 mg/dL (ref 6–20)
CO2: 24 mmol/L (ref 22–32)
Calcium: 9.7 mg/dL (ref 8.9–10.3)
Chloride: 100 mmol/L (ref 98–111)
Creatinine, Ser: 1.02 mg/dL (ref 0.61–1.24)
GFR, Estimated: >60 mL/min
Glucose, Bld: 85 mg/dL (ref 70–99)
Potassium: 4.2 mmol/L (ref 3.5–5.1)
Sodium: 136 mmol/L (ref 135–145)
Total Bilirubin: 0.5 mg/dL (ref 0.0–1.2)
Total Protein: 7.6 g/dL (ref 6.5–8.1)

## 2024-01-21 MED ORDER — TRAZODONE HCL 100 MG PO TABS
100.0000 mg | ORAL_TABLET | Freq: Every evening | ORAL | Status: DC | PRN
  Administered 2024-01-21 – 2024-01-26 (×6): 100 mg via ORAL
  Filled 2024-01-21: qty 7
  Filled 2024-01-21 (×6): qty 1

## 2024-01-21 NOTE — Progress Notes (Signed)
(  Sleep Hours) -4.5 (Any PRNs that were needed, meds refused, or side effects to meds)- Atarax  (Any disturbances and when (visitation, over night)-none (Concerns raised by the patient)- pt stated he normally works 1700-0500. Pt stated  this is why he is not able to sleep while here. Pt stated he would prefer to receive his trazodone  at 2200 to see if this promote a better sleep cycle. Writer educated pt on when he wake sup from his sleep to allow his body time to fall back asleep.  (SI/HI/AVH)-denied

## 2024-01-21 NOTE — BHH Group Notes (Signed)
 Adult Psychoeducational Group Note  Date:  01/21/2024 Time:  3:33 PM  Group Topic/Focus: Occupational Therapy  Participation Level:  did not attend   Derrick Joseph 01/21/2024, 3:33 PM

## 2024-01-21 NOTE — Group Note (Unsigned)
 Date:  01/21/2024 Time:  8:17 PM  Group Topic/Focus:  Wrap-Up Group:   The focus of this group is to help patients review their daily goal of treatment and discuss progress on daily workbooks.     Participation Level:  {BHH PARTICIPATION OZCZO:77735}  Participation Quality:  {BHH PARTICIPATION QUALITY:22265}  Affect:  {BHH AFFECT:22266}  Cognitive:  {BHH COGNITIVE:22267}  Insight: {BHH Insight2:20797}  Engagement in Group:  {BHH ENGAGEMENT IN HMNLE:77731}  Modes of Intervention:  {BHH MODES OF INTERVENTION:22269}  Additional Comments:  ***  Gwenn Nobie [place] 01/21/2024, 8:17 PM

## 2024-01-21 NOTE — BHH Group Notes (Signed)
 Adult Psychoeducational Group Note  Date:  01/21/2024 Time:  9:03 AM  Group Topic/Focus:  Goals Group:   The focus of this group is to help patients establish daily goals to achieve during treatment and discuss how the patient can incorporate goal setting into their daily lives to aide in recovery. Orientation:   The focus of this group is to educate the patient on the purpose and policies of crisis stabilization and provide a format to answer questions about their admission.  The group details unit policies and expectations of patients while admitted.  Participation Level:  Minimal  Derrick Joseph 01/21/2024, 9:03 AM

## 2024-01-21 NOTE — Progress Notes (Signed)
 Spiritual care group facilitated by Chaplain Rockie Sofia, [facility]  Group focused on topic of strength. Group members reflected on what thoughts and feelings emerge when they hear this topic. They then engaged in facilitated dialog around how strength is present in their lives. This dialog focused on representing what strength had been to them in their lives (images and patterns given) and what they saw as helpful in their life now (what they needed / wanted).  Activity drew on narrative framework.  Patient Progress: Derrick Joseph attended group and actively engaged and participated in group conversation and activities. Comments demonstrated good insight and contributed positively to the group conversation.

## 2024-01-21 NOTE — Group Note (Signed)
 Recreation Therapy Group Note   Group Topic:Leisure Education  Group Date: 01/21/2024 Start Time: 1032 End Time: 1107 Facilitators: Nonie Lochner-McCall, LRT,CTRS Location: [address] Dayroom   Group Topic: Leisure Education   Goal Area(s) Addresses:  Patient will successfully demonstrate knowledge of leisure and recreation interests. Patient will successfully identify benefits of leisure participation.  Patient will verbalize appropriate recreation activities to use post discharge.   Behavioral Response: Engaged   Intervention: Teaching Laboratory Technician   Activity: LRT facilitated a competitive game that addressed categories such as movies, TV, music and cultural sayings. Patients competed with each other to answer the questions correctly. The person with the most points won the game.   Education:  Leisure Programme Researcher, Broadcasting/film/video, Publishing Copy Outcome: Acknowledges education   Affect/Mood: Appropriate   Participation Level: Engaged   Participation Quality: Independent   Behavior: Appropriate   Speech/Thought Process: Focused   Insight: Good   Judgement: Good   Modes of Intervention: Competitive Play   Patient Response to Interventions:  Engaged   Education Outcome:  In group clarification offered    Clinical Observations/Individualized Feedback: Pt was calmer, more focused and less manic. Pt was appropriate and able to focus on activity. Pt ended up coming in last place in the game.  Pt was bright throughout group.     Plan: Continue to engage patient in RT group sessions 2-3x/week.   Duston Smolenski-McCall, LRT,CTRS 01/21/2024 1:25 PM

## 2024-01-21 NOTE — Group Note (Unsigned)
 Date:  01/21/2024 Time:  8:18 PM  Group Topic/Focus:  Wrap-Up Group:   The focus of this group is to help patients review their daily goal of treatment and discuss progress on daily workbooks.     Participation Level:  {BHH PARTICIPATION OZCZO:77735}  Participation Quality:  {BHH PARTICIPATION QUALITY:22265}  Affect:  {BHH AFFECT:22266}  Cognitive:  {BHH COGNITIVE:22267}  Insight: {BHH Insight2:20797}  Engagement in Group:  {BHH ENGAGEMENT IN HMNLE:77731}  Modes of Intervention:  {BHH MODES OF INTERVENTION:22269}  Additional Comments:  ***  Gwenn Nobie [place] 01/21/2024, 8:18 PM

## 2024-01-21 NOTE — Group Note (Signed)
 Date:  01/21/2024 Time:  8:31 PM  Group Topic/Focus:  Wrap-Up Group:   The focus of this group is to help patients review their daily goal of treatment and discuss progress on daily workbooks.    Participation Level:  Active  Participation Quality:  Appropriate  Affect:  Appropriate  Cognitive:  Appropriate  Insight: Appropriate  Engagement in Group:  Engaged  Modes of Intervention:  Education  Additional Comments:  Patient attended and participated in group tonight. He reports that the thing he like about himself is that he is confident.  Gwenn Chillington Dacosta 01/21/2024, 8:31 PM

## 2024-01-21 NOTE — BHH Group Notes (Signed)
 Adult Psychoeducational Group Note  Date:  01/21/2024 Time:  11:13 AM  Group Topic/Focus: Rec. Therapy  Participation Level:  Active  Participation Quality:  Appropriate  Affect:  Appropriate  Cognitive:  Appropriate  Insight: Appropriate  Engagement in Group:  Engaged  Modes of Intervention:  Activity  Additional Comments:   Triana Coover 01/21/2024, 11:13 AM

## 2024-01-21 NOTE — BHH Group Notes (Signed)
 Adult Psychoeducational Group Note  Date:  01/21/2024 Time:  1:47 PM  Group Topic/Focus: Spiritual Wellness  Participation Level:  Active  Participation Quality:  Appropriate and Attentive  Affect:  Appropriate  Cognitive:  Appropriate  Insight: Appropriate  Engagement in Group:  Engaged  Modes of Intervention:  Activity  Additional Comments:    Ronnell Puller 01/21/2024, 1:47 PM

## 2024-01-21 NOTE — Progress Notes (Signed)
 [facility] MD Progress Note  01/21/2024 11:29 AM Derrick Joseph  MRN:  [id number]  Reason for admission: 24 year old AA male with hx of mental illness (bipolar disorder) & cannabis use disorder. Admitted to the [facility] from the [facility] hospital with complaint of erratic behaviors (shouting and claiming that he owns much of the city and hospital. He claims to be communicating with God and apparently today tried to jump out of his mother's car when driving for help.  According to the IVC paperwork, he was also wearing a mask & talking about killing people).  A review of his current lab results has shown his UDS was positive for Benzodiazepine & THC). After medical evaluation & clearance at the [facility] ED, patient was recommended & transferred to the [facility] for inpatient psychiatric admission for psychiatric evaluation/treatments.   Daily notes: Derrick Joseph is seen this morning in his room. Chart reviewed. The chart findings discussed with the treatment team during the team meeting this afternoon. He presents alert, oriented  & aware of situation. He remains visible on the unit, attending group sessions. He presents with some hypomanic symptoms present, but redirectable. He is less talkative, listens more than he speaks. He reports, Joseph'm doing well & feeling good too. Joseph slept very well last night. Joseph have been up since breakfast this morning. Joseph try to stay in my room more, unless there is a group sessions going, then Joseph will attend. I do not want to get in trouble here. Joseph'm taking my medicines. There are no side effects. Joseph'm wondering when Joseph could take my sleep medicine here because at my job, Joseph work from 05:00 PM to 05:00 AM. Joseph was sleeping very well at home prior to being in the hospital. Joseph'm doing well on my medicines. My friend visited me here yesterday evening. My mama gave him some clothes to bring to me. Joseph had a good visit with him. My mama will be coming this evening to visit me. Joseph can't wait to see her.   Derrick Joseph currently denies any SIHI, AVH, delusional thoughts or paranoia. As stated above, he is less talkative & grandiose today. He is more redirectable today than the previous days. He is taking & tolerating his treatment regimen. He denies any side effects. Patient is is agreeable in taking monthly Invega  injectable prior to discharge & will continue the monthly injectable on an outpatient basis after discharge. Derrick Joseph currently denies any SIHI, AVH, delusional thoughts or paranoia. He does not appear to be responding to any internal stimuli. Reviewed current lab results, HGBa1c within norm. Valproic acid  level to be obtained on 01-23-24. Vital signs remain stable. The Trazodone  is changed from routine to prn.  Principal Problem: Bipolar disorder, current episode manic without psychotic features (HCC)  Diagnosis: Principal Problem:   Bipolar disorder, current episode manic without psychotic features (HCC) Active Problems:   Cannabis use disorder, moderate, in controlled environment (HCC)   Bipolar 1 disorder (HCC)  Total Time spent with patient: 35 minutes.  Past Psychiatric History: Bipolar disorder, manic episodes.  Past Medical History: History reviewed. No pertinent past medical history. History reviewed. No pertinent surgical history.  Family History: History reviewed. No pertinent family history.  Family Psychiatric  History: See H&P.  Social History:  Social History   Substance and Sexual Activity  Alcohol Use Not Currently     Social History   Substance and Sexual Activity  Drug Use Not Currently    Social History   Socioeconomic  History   Marital status: Single    Spouse name: Not on file   Number of children: Not on file   Years of education: Not on file   Highest education level: Not on file  Occupational History   Not on file  Tobacco Use   Smoking status: Every Day    Types: Cigarettes    Passive exposure: Current   Smokeless tobacco: Never  Vaping Use    Vaping status: Every Day   Substances: Nicotine , Flavoring  Substance and Sexual Activity   Alcohol use: Not Currently   Drug use: Not Currently   Sexual activity: Yes    Birth control/protection: Condom  Other Topics Concern   Not on file  Social History Narrative   Not on file   Social Drivers of Health   Tobacco Use: High Risk (01/18/2024)   Patient History    Smoking Tobacco Use: Every Day    Smokeless Tobacco Use: Never    Passive Exposure: Current  Financial Resource Strain: Not on file  Food Insecurity: No Food Insecurity (01/18/2024)   Epic    Worried About Programme Researcher, Broadcasting/film/video in the Last Year: Never true    Ran Out of Food in the Last Year: Never true  Transportation Needs: No Transportation Needs (01/18/2024)   Epic    Lack of Transportation (Medical): No    Lack of Transportation (Non-Medical): No  Physical Activity: Not on file  Stress: Not on file  Social Connections: Not on file  Depression (PHQ2-9): Low Risk (08/18/2022)   Depression (PHQ2-9)    PHQ-2 Score: 0  Alcohol Screen: Low Risk (01/18/2024)   Alcohol Screen    Last Alcohol Screening Score (AUDIT): 0  Housing: Low Risk (01/18/2024)   Epic    Unable to Pay for Housing in the Last Year: No    Number of Times Moved in the Last Year: 0    Homeless in the Last Year: No  Utilities: Not At Risk (01/18/2024)   Epic    Threatened with loss of utilities: No  Health Literacy: Not on file   Additional Social History:   Sleep: Good Estimated Sleeping Duration (Last 24 Hours): 4.00-4.75 hours  Appetite:  Good  Current Medications: Current Facility-Administered Medications  Medication Dose Route Frequency Provider Last Rate Last Admin   acetaminophen  (TYLENOL ) tablet 650 mg  650 mg Oral Q6H PRN Coleman, Carolyn H, NP       alum & mag hydroxide-simeth (MAALOX/MYLANTA) 200-200-20 MG/5ML suspension 30 mL  30 mL Oral Q4H PRN Coleman, Carolyn H, NP       haloperidol  (HALDOL ) tablet 5 mg  5 mg Oral TID PRN  Coleman, Carolyn H, NP   5 mg at 01/19/24 1748   And   diphenhydrAMINE  (BENADRYL ) capsule 50 mg  50 mg Oral TID PRN Coleman, Carolyn H, NP   50 mg at 01/19/24 1748   haloperidol  lactate (HALDOL ) injection 5 mg  5 mg Intramuscular TID PRN Mardy Elveria DEL, NP       And   diphenhydrAMINE  (BENADRYL ) injection 50 mg  50 mg Intramuscular TID PRN Mardy Elveria DEL, NP       And   LORazepam  (ATIVAN ) injection 2 mg  2 mg Intramuscular TID PRN Mardy Elveria DEL, NP       haloperidol  lactate (HALDOL ) injection 10 mg  10 mg Intramuscular TID PRN Mardy Elveria DEL, NP       And   diphenhydrAMINE  (BENADRYL ) injection 50 mg  50 mg  Intramuscular TID PRN Mardy Elveria DEL, NP       And   LORazepam  (ATIVAN ) injection 2 mg  2 mg Intramuscular TID PRN Coleman, Carolyn H, NP       divalproex  (DEPAKOTE ) DR tablet 250 mg  250 mg Oral Q12H Derrick Joseph I, NP   250 mg at 01/21/24 9268   hydrOXYzine  (ATARAX ) tablet 25 mg  25 mg Oral TID PRN Coleman, Carolyn H, NP   25 mg at 01/20/24 2049   magnesium  hydroxide (MILK OF MAGNESIA) suspension 30 mL  30 mL Oral Daily PRN Coleman, Carolyn H, NP       nicotine  polacrilex (NICORETTE ) gum 2 mg  2 mg Oral PRN Pashayan, Alexander S, DO   2 mg at 01/21/24 9268   paliperidone  (INVEGA ) 24 hr tablet 6 mg  6 mg Oral Daily Eura Radabaugh I, NP   6 mg at 01/21/24 9268   traZODone  (DESYREL ) tablet 100 mg  100 mg Oral QHS Collene Gouge I, NP   100 mg at 01/20/24 2049   Lab Results:  Results for orders placed or performed during the hospital encounter of 01/18/24 (from the past 48 hours)  TSH     Status: None   Collection Time: 01/20/24  6:24 AM  Result Value Ref Range   TSH 0.748 0.350 - 4.500 uIU/mL    Comment: Performed at [facility], [address], [place], [place] [address]  Hemoglobin A1c     Status: None   Collection Time: 01/20/24  6:24 AM  Result Value Ref Range   Hgb A1c MFr Bld 5.4 4.8 - 5.6 %    Comment: (NOTE) Diagnosis of Diabetes The  following HbA1c ranges recommended by the American Diabetes Association (ADA) may be used as an aid in the diagnosis of diabetes mellitus.  Hemoglobin             Suggested A1C NGSP%              Diagnosis  <5.7                   Non Diabetic  5.7-6.4                Pre-Diabetic  >6.4                   Diabetic  <7.0                   Glycemic control for                       adults with diabetes.     Mean Plasma Glucose 108.28 mg/dL    Comment: Performed at [facility], [address] [place], [place] [address]  Comprehensive metabolic panel with GFR     Status: Abnormal   Collection Time: 01/21/24  6:36 AM  Result Value Ref Range   Sodium 136 135 - 145 mmol/L    Comment: Electrolytes repeated to verify    Potassium 4.2 3.5 - 5.1 mmol/L   Chloride 100 98 - 111 mmol/L   CO2 24 22 - 32 mmol/L   Glucose, Bld 85 70 - 99 mg/dL    Comment: Glucose reference range applies only to samples taken after fasting for at least 8 hours.   BUN 13 6 - 20 mg/dL   Creatinine, Ser 8.97 0.61 - 1.24 mg/dL   Calcium 9.7 8.9 - 89.6 mg/dL   Total  Protein 7.6 6.5 - 8.1 g/dL   Albumin 4.5 3.5 - 5.0 g/dL   AST 52 (H) 15 - 41 U/L   ALT 37 0 - 44 U/L   Alkaline Phosphatase 105 38 - 126 U/L   Total Bilirubin 0.5 0.0 - 1.2 mg/dL   GFR, Estimated >39 >39 mL/min    Comment: (NOTE) Calculated using the CKD-EPI Creatinine Equation (2021)    Anion gap 12 5 - 15    Comment: Performed at [facility], [address] [place], [place] [address]   Blood Alcohol level:  Lab Results  Component Value Date   [facility] <15 01/17/2024   ETH <10 05/05/2022   Metabolic Disorder Labs: Lab Results  Component Value Date   HGBA1C 5.4 01/20/2024   MPG 108.28 01/20/2024   MPG 114 03/10/2022   No results found for: PROLACTIN Lab Results  Component Value Date   CHOL 106 03/11/2022   TRIG 52 03/11/2022   HDL 38 (L) 03/11/2022   CHOLHDL 2.8 03/11/2022   VLDL 10 03/11/2022   LDLCALC 58  03/11/2022   Physical Findings: AIMS:  ,  ,  ,  ,  ,  ,   CIWA:    COWS:     Musculoskeletal: Strength & Muscle Tone: within normal limits Gait & Station: normal Patient leans: N/A  Psychiatric Specialty Exam:  Presentation  General Appearance:  Appropriate for Environment; Casual; Well Groomed  Eye Contact: Good  Speech: Clear and Coherent (slightly pressured.)  Speech Volume: -- (Slightly, increased.)  Handedness: Right   Mood and Affect  Mood: -- (Improving.)  Affect: Appropriate; Congruent  Thought Process  Thought Processes: Coherent; Goal Directed  Descriptions of Associations:Intact  Orientation:Full (Time, Place and Person)  Thought Content:Logical  History of Schizophrenia/Schizoaffective disorder: NA.  Duration of Psychotic Symptoms: NA  Hallucinations:Hallucinations: None  Ideas of Reference:None  Suicidal Thoughts:Suicidal Thoughts: No  Homicidal Thoughts:Homicidal Thoughts: No  Sensorium  Memory: Immediate Good; Recent Good; Remote Good  Judgment: Fair  Insight: Fair  Art Therapist  Concentration: Fair  Attention Span: Fair  Recall: Good  Fund of Knowledge: Fair  Language: Good  Psychomotor Activity  Psychomotor Activity: Psychomotor Activity: Increased  Assets  Assets: Communication Skills; Desire for Improvement; Financial Resources/Insurance; Housing; Physical Health; Resilience; Social Support  Sleep  Sleep: Sleep: Good Number of Hours of Sleep: 8  Physical Exam: Physical Exam Vitals and nursing note reviewed.  HENT:     Head: Normocephalic.     Nose: Nose normal.     Mouth/Throat:     Pharynx: Oropharynx is clear.  Cardiovascular:     Rate and Rhythm: Normal rate.     Pulses: Normal pulses.  Pulmonary:     Effort: Pulmonary effort is normal.  Genitourinary:    Comments: Deferred. Musculoskeletal:        General: Normal range of motion.     Cervical back: Normal range of motion.   Skin:    General: Skin is dry.  Neurological:     General: No focal deficit present.     Mental Status: He is alert and oriented to person, place, and time.    Review of Systems  Constitutional:  Negative for chills, diaphoresis and fever.  HENT:  Negative for congestion and sore throat.   Eyes:  Negative for blurred vision.  Respiratory:  Negative for cough, shortness of breath and wheezing.   Cardiovascular:  Negative for chest pain and palpitations.  Gastrointestinal:  Negative for abdominal pain, constipation,  diarrhea, heartburn, nausea and vomiting.  Genitourinary:  Negative for dysuria.  Musculoskeletal:  Negative for joint pain and myalgias.  Skin:  Negative for itching and rash.  Neurological:  Negative for dizziness, tingling, tremors, sensory change, speech change, focal weakness, seizures, loss of consciousness, weakness and headaches.  Endo/Heme/Allergies:        NKDA.  Psychiatric/Behavioral:  Positive for substance abuse (UDS (+) for THC & benzo.). Negative for depression, hallucinations, memory loss and suicidal ideas. The patient is nervous/anxious. The patient does not have insomnia.    Blood pressure 127/78, pulse 65, temperature 98.1 F (36.7 C), temperature source Oral, resp. rate 16, height 5' 7 (1.702 m), weight 56.7 kg, SpO2 100%. Body mass index is 19.58 kg/m.  Treatment Plan Summary: Daily contact with patient to assess and evaluate symptoms and progress in treatment and Medication management.   Principal/active diagnoses.  Bipolar disorder, current episode manic without psychotic features (HCC) Cannabis use disorder, moderate, in controlled environment (HCC) Bipolar 1 disorder (HCC)  Plan: The risks/benefits/side-effects/alternatives to the medications in use were discussed in detail with the patient and time was given for patient's questions. The patient consents to medication trial.    -Discontinued Zyprexa  zydis.  -Completed Invega  1.5 mg once po  today for mood control.  -Continue Invega  6 mg po daily for mood control.  -Continue Depakote  DR. 250 mg po twice daily for manic symptoms.  -Changed Trazodone  100 mg from routinely to prn for insomnia.  -Continue Hydroxyzine  25 mg po tid prn for anxiety.    Labs to be obtained: Reviewed new lab results, TSH within norm. Result for hgba1c 01-20-24: 5.4 (within norm).                                       Valproic acid  level on 01-23-24.   Agitation protocols.  -Continue as recommended (see MAR).    Other PRNS -Continue Tylenol  650 mg every 6 hours PRN for mild pain -Continue Maalox 30 ml Q 4 hrs PRN for indigestion -Continue MOM 30 ml po Q 6 hrs for constipation   Safety and Monitoring: Voluntary admission to inpatient psychiatric unit for safety, stabilization and treatment Daily contact with patient to assess and evaluate symptoms and progress in treatment Patient's case to be discussed in multi-disciplinary team meeting Observation Level : q15 minute checks Vital signs: q12 hours Precautions: Safety   Discharge Planning: Social work and case management to assist with discharge planning and identification of hospital follow-up needs prior to discharge Estimated LOS: 5-7 days Discharge Concerns: Need to establish a safety plan; Medication compliance and effectiveness Discharge Goals: Return home with outpatient referrals for mental health follow-up including medication management/psychotherapy  Mac Bolster, NP, pmhnp, fnp-bc. 01/21/2024, 11:29 AM Patient ID: Derrick Joseph, male   DOB: August 16, 2000, 24 y.o.   MRN: [id number]

## 2024-01-21 NOTE — Plan of Care (Signed)
   Problem: Education: Goal: Mental status will improve Outcome: Progressing Goal: Verbalization of understanding the information provided will improve Outcome: Progressing   Problem: Activity: Goal: Interest or engagement in activities will improve Outcome: Progressing

## 2024-01-21 NOTE — Plan of Care (Signed)
   Problem: Education: Goal: Emotional status will improve Outcome: Progressing

## 2024-01-22 NOTE — Group Note (Signed)
 Date:  01/22/2024 Time:  3:42 PM  Group Topic/Focus:  Emotional Wellness. Healthy choices.    Participation Level:  Did Not Attend   Annett Redman Ramsay 01/22/2024, 3:42 PM

## 2024-01-22 NOTE — Group Note (Signed)
 Date:  01/22/2024 Time:  3:45 PM  Group Topic/Focus:  Goals Group:   The focus of this group is to help patients establish daily goals to achieve during treatment and discuss how the patient can incorporate goal setting into their daily lives to aide in recovery.    Participation Level:  Did Not Attend  Annett Redman Ramsay 01/22/2024, 3:45 PM

## 2024-01-22 NOTE — Group Note (Signed)
 LCSW Group Therapy Note  Group Date: 01/22/2024 Start Time: 1030 End Time: 1100  Type of Therapy and Topic:  Group Therapy - Healthy Ways of Asking for Help as a Coping Technique  Participation Level:  Active   Description of Group The focus of this group was to determine what healthy techniques typically are used when asking someone for help. What verbiage would be helpful in coping with various problems and who individually they can call. Patients were guided in becoming aware of the differences between healthy and unhealthy coping techniques. Patients were asked to identify 2-3 healthy people and/or organizations they would like to learn to use more effectively.  Therapeutic Goals Patients learned that coping is what human beings do all day long to deal with various situations in their lives Patients defined and discussed healthy vs unhealthy coping techniques Patients identified their preferred coping techniques and identified someone to whom they could call when needed. Patients determined 2-3 healthy coping skills they would like to become more familiar with and use more often. Patients provided support and ideas to each other   Summary of Patient Progress:  During group, Derrick Joseph expressed his mother is his support person. When asked to identify an emotional picture that described how he/she felt today, pt reported he felt light/colorful/bright. Patient proved open to input from peers and feedback from CSW. Patient demonstrated good insight into the subject matter, was respectful of peers, and participated throughout the entire session.   Therapeutic Modalities Cognitive Behavioral Therapy Motivational Interviewing   Derrick Joseph, [place] 01/22/2024  12:27 PM

## 2024-01-22 NOTE — Social Work (Signed)
 Adult Psychoeducational Group Note  Date:  01/22/2024 Time:  1:38 PM  Group Topic/Focus:  Healthy Communication:   The focus of this group is to discuss communication, barriers to communication, as well as healthy ways to communicate with others.  Participation Level:  Active  Participation Quality:  Attentive  Affect:  Appropriate  Cognitive:  Appropriate  Insight: Good  Engagement in Group:  Engaged  Modes of Intervention:  Discussion  Additional Comments:   Annett Berle Hoyer 01/22/2024, 1:38 PM

## 2024-01-22 NOTE — Progress Notes (Signed)
 [facility] MD Progress Note  01/22/2024 3:46 PM Derrick Joseph  MRN:  [id number] Subjective:   Derrick Joseph is a 24 yr old male who presented on 1/12 to [facility] under IVC for manic, erratic, and grandiose behavior, he was admitted to [facility] on 1/14.  PPHx is significant for Bipolar Disorder and 1 Prior Psychiatric Hospitalization (2021).   Case was discussed in the multidisciplinary team. MAR was reviewed and patient was compliant with medications.  He received PRN Hydroxyzine  and Trazodone  yesterday.   Psychiatric Team made the following recommendations yesterday: -Continue Depakote  DR 250 BID for mood stability -Continue Invega  6 mg daily for mood stability/psychosis   On interview today patient reports he slept good last night.  He reports his appetite is doing good.  He reports no SI, HI, or AVH.  He reports no Paranoia or Ideas of Reference.  He reports no issues with his medications.  He reports that he is not too tired today and is feeling good.  He reports he is enjoying his vacation from work but is looking forward to going back to work.  He reports that he plan to take advantage of the offer to buy [employer] since he has worked there for long enough so that he can build up retirement.  He reports no other concerns at present.  Principal Problem: Bipolar disorder, current episode manic without psychotic features (HCC) Diagnosis: Principal Problem:   Bipolar disorder, current episode manic without psychotic features (HCC) Active Problems:   Cannabis use disorder, moderate, in controlled environment (HCC)   Bipolar 1 disorder (HCC)  Total Time spent with patient:  I personally spent 35 minutes on the unit in direct patient care. The direct patient care time included face-to-face time with the patient, reviewing the patient's chart, communicating with other professionals, and coordinating care.    Past Psychiatric History:  Bipolar Disorder and 1 Prior Psychiatric Hospitalization  (2021).  Past Medical History: History reviewed. No pertinent past medical history. History reviewed. No pertinent surgical history. Family History: History reviewed. No pertinent family history. Family Psychiatric  History:  Bipolar disorder: Paternal cousin. Psych Rx: did not report SA/HA: Smokes weed. Substance use: in father, denies SUD in family otherwise  Inpatient psych admission: sister and paternal cousin, patient did not report a psychiatric diagnosis for them. Rehab: did not report  Social History:  Social History   Substance and Sexual Activity  Alcohol Use Not Currently     Social History   Substance and Sexual Activity  Drug Use Not Currently    Social History   Socioeconomic History   Marital status: Single    Spouse name: Not on file   Number of children: Not on file   Years of education: Not on file   Highest education level: Not on file  Occupational History   Not on file  Tobacco Use   Smoking status: Every Day    Types: Cigarettes    Passive exposure: Current   Smokeless tobacco: Never  Vaping Use   Vaping status: Every Day   Substances: Nicotine , Flavoring  Substance and Sexual Activity   Alcohol use: Not Currently   Drug use: Not Currently   Sexual activity: Yes    Birth control/protection: Condom  Other Topics Concern   Not on file  Social History Narrative   Not on file   Social Drivers of Health   Tobacco Use: High Risk (01/18/2024)   Patient History    Smoking Tobacco Use: Every Day  Smokeless Tobacco Use: Never    Passive Exposure: Current  Financial Resource Strain: Not on file  Food Insecurity: No Food Insecurity (01/18/2024)   Epic    Worried About Programme Researcher, Broadcasting/film/video in the Last Year: Never true    Ran Out of Food in the Last Year: Never true  Transportation Needs: No Transportation Needs (01/18/2024)   Epic    Lack of Transportation (Medical): No    Lack of Transportation (Non-Medical): No  Physical Activity: Not on  file  Stress: Not on file  Social Connections: Not on file  Depression (PHQ2-9): Low Risk (08/18/2022)   Depression (PHQ2-9)    PHQ-2 Score: 0  Alcohol Screen: Low Risk (01/18/2024)   Alcohol Screen    Last Alcohol Screening Score (AUDIT): 0  Housing: Low Risk (01/18/2024)   Epic    Unable to Pay for Housing in the Last Year: No    Number of Times Moved in the Last Year: 0    Homeless in the Last Year: No  Utilities: Not At Risk (01/18/2024)   Epic    Threatened with loss of utilities: No  Health Literacy: Not on file   Additional Social History:                         Sleep: Good Estimated Sleeping Duration (Last 24 Hours): 4.25-5.50 hours  Appetite:  Good  Current Medications: Current Facility-Administered Medications  Medication Dose Route Frequency Provider Last Rate Last Admin   acetaminophen  (TYLENOL ) tablet 650 mg  650 mg Oral Q6H PRN Coleman, Carolyn H, NP       alum & mag hydroxide-simeth (MAALOX/MYLANTA) 200-200-20 MG/5ML suspension 30 mL  30 mL Oral Q4H PRN Coleman, Carolyn H, NP       haloperidol  (HALDOL ) tablet 5 mg  5 mg Oral TID PRN Mardy Elveria DEL, NP   5 mg at 01/19/24 1748   And   diphenhydrAMINE  (BENADRYL ) capsule 50 mg  50 mg Oral TID PRN Coleman, Carolyn H, NP   50 mg at 01/19/24 1748   haloperidol  lactate (HALDOL ) injection 5 mg  5 mg Intramuscular TID PRN Mardy Elveria DEL, NP       And   diphenhydrAMINE  (BENADRYL ) injection 50 mg  50 mg Intramuscular TID PRN Mardy Elveria DEL, NP       And   LORazepam  (ATIVAN ) injection 2 mg  2 mg Intramuscular TID PRN Coleman, Carolyn H, NP       haloperidol  lactate (HALDOL ) injection 10 mg  10 mg Intramuscular TID PRN Mardy Elveria DEL, NP       And   diphenhydrAMINE  (BENADRYL ) injection 50 mg  50 mg Intramuscular TID PRN Mardy Elveria DEL, NP       And   LORazepam  (ATIVAN ) injection 2 mg  2 mg Intramuscular TID PRN Coleman, Carolyn H, NP       divalproex  (DEPAKOTE ) DR tablet 250 mg  250 mg Oral  Q12H Nwoko, Agnes I, NP   250 mg at 01/22/24 9170   hydrOXYzine  (ATARAX ) tablet 25 mg  25 mg Oral TID PRN Coleman, Carolyn H, NP   25 mg at 01/21/24 2048   magnesium  hydroxide (MILK OF MAGNESIA) suspension 30 mL  30 mL Oral Daily PRN Coleman, Carolyn H, NP       nicotine  polacrilex (NICORETTE ) gum 2 mg  2 mg Oral PRN Raegyn Renda S, DO   2 mg at 01/22/24 0831   paliperidone  (INVEGA ) 24 hr  tablet 6 mg  6 mg Oral Daily Nwoko, Agnes I, NP   6 mg at 01/22/24 9170   traZODone  (DESYREL ) tablet 100 mg  100 mg Oral QHS PRN Collene Gouge I, NP   100 mg at 01/21/24 2048    Lab Results:  Results for orders placed or performed during the hospital encounter of 01/18/24 (from the past 48 hours)  Comprehensive metabolic panel with GFR     Status: Abnormal   Collection Time: 01/21/24  6:36 AM  Result Value Ref Range   Sodium 136 135 - 145 mmol/L    Comment: Electrolytes repeated to verify    Potassium 4.2 3.5 - 5.1 mmol/L   Chloride 100 98 - 111 mmol/L   CO2 24 22 - 32 mmol/L   Glucose, Bld 85 70 - 99 mg/dL    Comment: Glucose reference range applies only to samples taken after fasting for at least 8 hours.   BUN 13 6 - 20 mg/dL   Creatinine, Ser 8.97 0.61 - 1.24 mg/dL   Calcium 9.7 8.9 - 89.6 mg/dL   Total Protein 7.6 6.5 - 8.1 g/dL   Albumin 4.5 3.5 - 5.0 g/dL   AST 52 (H) 15 - 41 U/L   ALT 37 0 - 44 U/L   Alkaline Phosphatase 105 38 - 126 U/L   Total Bilirubin 0.5 0.0 - 1.2 mg/dL   GFR, Estimated >39 >39 mL/min    Comment: (NOTE) Calculated using the CKD-EPI Creatinine Equation (2021)    Anion gap 12 5 - 15    Comment: Performed at [facility], [address] [place], [place] [address]    Blood Alcohol level:  Lab Results  Component Value Date   [facility] <15 01/17/2024   ETH <10 05/05/2022    Metabolic Disorder Labs: Lab Results  Component Value Date   HGBA1C 5.4 01/20/2024   MPG 108.28 01/20/2024   MPG 114 03/10/2022   No results found for:  PROLACTIN Lab Results  Component Value Date   CHOL 106 03/11/2022   TRIG 52 03/11/2022   HDL 38 (L) 03/11/2022   CHOLHDL 2.8 03/11/2022   VLDL 10 03/11/2022   LDLCALC 58 03/11/2022    Physical Findings: AIMS:  ,  ,  ,  ,  ,  ,   CIWA:    COWS:     Musculoskeletal: Strength & Muscle Tone: within normal limits Gait & Station: normal Patient leans: N/A  Psychiatric Specialty Exam:  Presentation  General Appearance:  Appropriate for Environment; Casual  Eye Contact: Good  Speech: Clear and Coherent (rapid)  Speech Volume: Normal  Handedness: Right   Mood and Affect  Mood: -- (good)  Affect: Congruent   Thought Process  Thought Processes: Coherent; Goal Directed  Descriptions of Associations:Intact  Orientation:Full (Time, Place and Person)  Thought Content:Logical; WDL  History of Schizophrenia/Schizoaffective disorder:No data recorded Duration of Psychotic Symptoms:No data recorded Hallucinations:Hallucinations: None  Ideas of Reference:None  Suicidal Thoughts:Suicidal Thoughts: No  Homicidal Thoughts:Homicidal Thoughts: No   Sensorium  Memory: Immediate Good; Recent Good  Judgment: Fair  Insight: Fair   Art Therapist  Concentration: Fair  Attention Span: Fair  Recall: Good  Fund of Knowledge: Good  Language: Good   Psychomotor Activity  Psychomotor Activity: Psychomotor Activity: Normal   Assets  Assets: Communication Skills; Desire for Improvement; Resilience; Physical Health; Social Support; Health And Safety Inspector   Sleep  Sleep: Sleep: Good Number of Hours of Sleep: 8    Physical Exam: Physical  Exam Vitals and nursing note reviewed.  Constitutional:      General: He is not in acute distress.    Appearance: Normal appearance. He is normal weight. He is not ill-appearing or toxic-appearing.  HENT:     Head: Normocephalic and atraumatic.  Pulmonary:     Effort: Pulmonary effort is  normal.  Musculoskeletal:        General: Normal range of motion.  Neurological:     General: No focal deficit present.     Mental Status: He is alert.    Review of Systems  Respiratory:  Negative for cough and shortness of breath.   Cardiovascular:  Negative for chest pain.  Gastrointestinal:  Negative for abdominal pain, constipation, diarrhea, nausea and vomiting.  Neurological:  Negative for dizziness, weakness and headaches.  Psychiatric/Behavioral:  Negative for depression, hallucinations and suicidal ideas. The patient is not nervous/anxious.    Blood pressure 114/76, pulse 72, temperature (!) 97.5 F (36.4 C), temperature source Oral, resp. rate 16, height 5' 7 (1.702 m), weight 56.7 kg, SpO2 100%. Body mass index is 19.58 kg/m.   Treatment Plan Summary: Daily contact with patient to assess and evaluate symptoms and progress in treatment and Medication management  Acey Woodfield is a 24 yr old male who presented on 1/12 to [facility] under IVC for manic, erratic, and grandiose behavior, he was admitted to [facility] on 1/14.  PPHx is significant for Bipolar Disorder and 1 Prior Psychiatric Hospitalization (2021).   [place] is responding well to his medications.  He continues to be elevated/manic and grandiose but is improving.  Given he did well on Invega  Sustenna in the past we will offer this to him tomorrow.  We will not make any changes to his medications at this time.  We will continue to monitor.    Bipolar Disorder, Manic: -Continue Depakote  DR 250 BID for mood stability -Continue Invega  6 mg daily for mood stability/psychosis -Will offer Invega  Sustenna   Labs to be obtained: Reviewed new lab results, TSH within norm. Result for hgba1c 01-20-24: 5.4 (within norm).                                       Valproic acid  level on 01-23-24.   Agitation protocols.  -Continue as recommended (see MAR).    Other PRNS -Continue Tylenol  650 mg every 6 hours PRN for mild  pain -Continue Maalox 30 ml Q 4 hrs PRN for indigestion -Continue MOM 30 ml po Q 6 hrs for constipation   Safety and Monitoring: Voluntary admission to inpatient psychiatric unit for safety, stabilization and treatment Daily contact with patient to assess and evaluate symptoms and progress in treatment Patient's case to be discussed in multi-disciplinary team meeting Observation Level : q15 minute checks Vital signs: q12 hours Precautions: Safety   Discharge Planning: Social work and case management to assist with discharge planning and identification of hospital follow-up needs prior to discharge Estimated LOS: 5-7 days Discharge Concerns: Need to establish a safety plan; Medication compliance and effectiveness Discharge Goals: Return home with outpatient referrals for mental health follow-up including medication management/psychotherapy   Marsa GORMAN Rosser, DO 01/22/2024, 3:46 PM

## 2024-01-22 NOTE — [facility] Group Notes (Signed)
 Adult Psychoeducational Group Note  Date:  01/22/2024 Time:  8:10 PM  Group Topic/Focus:  Wrap-Up Group:   The focus of this group is to help patients review their daily goal of treatment and discuss progress on daily workbooks.  Participation Level:  Active  Participation Quality:  Appropriate  Affect:  Appropriate  Cognitive:  Appropriate  Insight: Appropriate  Engagement in Group:  Engaged  Modes of Intervention:  Discussion  Additional Comments:  Pt Attended group.   Derrick Joseph 01/22/2024, 8:10 PM

## 2024-01-22 NOTE — Plan of Care (Signed)
   Problem: Education: Goal: Emotional status will improve Outcome: Progressing Goal: Mental status will improve Outcome: Progressing

## 2024-01-22 NOTE — Group Note (Signed)
 Date:  01/22/2024 Time:  3:28 PM  Group Topic/Focus:   Intellectual Wellness    Participation Level:  Active  Participation Quality:  Attentive  Affect:  Appropriate  Cognitive:  Appropriate  Insight: Good  Engagement in Group:  Engaged  Modes of Intervention:  Discussion Additional Comments:    Annett Berle Hoyer 01/22/2024, 3:28 PM

## 2024-01-23 LAB — VALPROIC ACID LEVEL: Valproic Acid Lvl: 48 ug/mL — ABNORMAL LOW (ref 50–100)

## 2024-01-23 MED ORDER — PALIPERIDONE PALMITATE ER 234 MG/1.5ML IM SUSY
234.0000 mg | PREFILLED_SYRINGE | Freq: Once | INTRAMUSCULAR | Status: AC
  Administered 2024-01-24: 234 mg via INTRAMUSCULAR
  Filled 2024-01-23: qty 1.5

## 2024-01-23 NOTE — Group Note (Signed)
 Date:  01/23/2024 Time:  9:06 PM  Group Topic/Focus:  Wrap-Up Group:   The focus of this group is to help patients review their daily goal of treatment and discuss progress on daily workbooks.    Participation Level:  Active  Participation Quality:  Appropriate  Affect:  Appropriate  Cognitive:  Appropriate  Insight: Appropriate  Engagement in Group:  Engaged  Modes of Intervention:  Education  Additional Comments:  Patient attended and participated in group tonight.  He reports that today was a good day His goal was work out.  He talked with his doctor and watched television.  Gwenn Chillington Dacosta 01/23/2024, 9:06 PM

## 2024-01-23 NOTE — Progress Notes (Signed)
(  Sleep Hours) - 8.25 (Any PRNs that were needed, meds refused, or side effects to meds)- Trazodone , hydroxyzine  (Any disturbances and when (visitation, over night)- none (Concerns raised by the patient)- none  (SI/HI/AVH)- denies

## 2024-01-23 NOTE — [facility] Group Notes (Signed)
 Adult Psychoeducational Group Note  Date:  01/23/2024 Time:  3:08 PM  Group Topic/Focus: Emotional Wellness  Participation Level:  Active  Participation Quality:    Affect:    Cognitive:    Insight:   Engagement in Group:    Modes of Intervention:    Additional Comments:    Sammi Stolarz O 01/23/2024, 3:08 PM

## 2024-01-23 NOTE — Progress Notes (Signed)
 [facility] MD Progress Note  01/23/2024 2:34 PM Micholas Drumwright  MRN:  [id number] Subjective:   Derrick Joseph is a 24 yr old male who presented on 1/12 to [facility] under IVC for manic, erratic, and grandiose behavior, he was admitted to [facility] on 1/14.  PPHx is significant for Bipolar Disorder and 1 Prior Psychiatric Hospitalization (2021).  24-hour chart review: Case was discussed in the multidisciplinary team. MAR was reviewed and patient was compliant with medications.  He received PRN Hydroxyzine  x 2 for anxiety, Tylenol  x 1 for mild pain, nicotine  gum x 3 for smoking cessation and Trazodone  x 1 for sleep.  Compliant with his scheduled psychotropic medications.   Psychiatric Team made the following recommendations yesterday: -Continue Depakote  DR 250 BID for mood stability -Continue Invega  6 mg daily for mood stability/psychosis  Today's assessment notes: During assessment today, patient was seen and examined in his room sitting up on his bed.  Reports mood is euthymic with pleasant affect.  Inquiring about his Invega  injection so as to enable him to prepare for discharge.  Made aware that he will be administered Invega  Sustenna 234 mg LAI tomorrow in preparation for discharge.  Compliant with scheduled psychotropic medications.  Reports he is excited and will start getting ready to go back to work.  Denies any acute discomfort and continues to report that God is good.  Observed attending therapeutic milieu and unit group activities and learning some coping skills.  Reports sleep is stable and nursing staff documented patient sleeping over 8.25 hours last night and feeling restful.  Reports anxiety is at manageable level.  Appetite is good, concentration is better and energy level is adequate.  Patient denies delusional thinking or paranoia.  He further denies SI, HI, or AVH.   Principal Problem: Bipolar disorder, current episode manic without psychotic features (HCC) Diagnosis: Principal Problem:    Bipolar disorder, current episode manic without psychotic features (HCC) Active Problems:   Cannabis use disorder, moderate, in controlled environment (HCC)   Bipolar 1 disorder (HCC)  Total Time spent with patient:  I personally spent 35 minutes on the unit in direct patient care. The direct patient care time included face-to-face time with the patient, reviewing the patient's chart, communicating with other professionals, and coordinating care.    Past Psychiatric History:  Bipolar Disorder and 1 Prior Psychiatric Hospitalization (2021).  Past Medical History: History reviewed. No pertinent past medical history. History reviewed. No pertinent surgical history. Family History: History reviewed. No pertinent family history. Family Psychiatric  History:  Bipolar disorder: Paternal cousin. Psych Rx: did not report SA/HA: Smokes weed. Substance use: in father, denies SUD in family otherwise  Inpatient psych admission: sister and paternal cousin, patient did not report a psychiatric diagnosis for them. Rehab: did not report  Social History:  Social History   Substance and Sexual Activity  Alcohol Use Not Currently     Social History   Substance and Sexual Activity  Drug Use Not Currently    Social History   Socioeconomic History   Marital status: Single    Spouse name: Not on file   Number of children: Not on file   Years of education: Not on file   Highest education level: Not on file  Occupational History   Not on file  Tobacco Use   Smoking status: Every Day    Types: Cigarettes    Passive exposure: Current   Smokeless tobacco: Never  Vaping Use   Vaping status: Every Day   Substances:  Nicotine , Flavoring  Substance and Sexual Activity   Alcohol use: Not Currently   Drug use: Not Currently   Sexual activity: Yes    Birth control/protection: Condom  Other Topics Concern   Not on file  Social History Narrative   Not on file   Social Drivers of Health    Tobacco Use: High Risk (01/18/2024)   Patient History    Smoking Tobacco Use: Every Day    Smokeless Tobacco Use: Never    Passive Exposure: Current  Financial Resource Strain: Not on file  Food Insecurity: No Food Insecurity (01/18/2024)   Epic    Worried About Programme Researcher, Broadcasting/film/video in the Last Year: Never true    Ran Out of Food in the Last Year: Never true  Transportation Needs: No Transportation Needs (01/18/2024)   Epic    Lack of Transportation (Medical): No    Lack of Transportation (Non-Medical): No  Physical Activity: Not on file  Stress: Not on file  Social Connections: Not on file  Depression (PHQ2-9): Low Risk (08/18/2022)   Depression (PHQ2-9)    PHQ-2 Score: 0  Alcohol Screen: Low Risk (01/18/2024)   Alcohol Screen    Last Alcohol Screening Score (AUDIT): 0  Housing: Low Risk (01/18/2024)   Epic    Unable to Pay for Housing in the Last Year: No    Number of Times Moved in the Last Year: 0    Homeless in the Last Year: No  Utilities: Not At Risk (01/18/2024)   Epic    Threatened with loss of utilities: No  Health Literacy: Not on file   Additional Social History:                         Sleep: Good Estimated Sleeping Duration (Last 24 Hours): 5.75-7.25 hours  Appetite:  Good  Current Medications: Current Facility-Administered Medications  Medication Dose Route Frequency Provider Last Rate Last Admin   acetaminophen  (TYLENOL ) tablet 650 mg  650 mg Oral Q6H PRN Coleman, Carolyn H, NP   650 mg at 01/22/24 1639   alum & mag hydroxide-simeth (MAALOX/MYLANTA) 200-200-20 MG/5ML suspension 30 mL  30 mL Oral Q4H PRN Coleman, Carolyn H, NP       haloperidol  (HALDOL ) tablet 5 mg  5 mg Oral TID PRN Coleman, Carolyn H, NP   5 mg at 01/19/24 1748   And   diphenhydrAMINE  (BENADRYL ) capsule 50 mg  50 mg Oral TID PRN Coleman, Carolyn H, NP   50 mg at 01/19/24 1748   haloperidol  lactate (HALDOL ) injection 5 mg  5 mg Intramuscular TID PRN Mardy Elveria DEL, NP        And   diphenhydrAMINE  (BENADRYL ) injection 50 mg  50 mg Intramuscular TID PRN Mardy Elveria DEL, NP       And   LORazepam  (ATIVAN ) injection 2 mg  2 mg Intramuscular TID PRN Coleman, Carolyn H, NP       haloperidol  lactate (HALDOL ) injection 10 mg  10 mg Intramuscular TID PRN Mardy Elveria DEL, NP       And   diphenhydrAMINE  (BENADRYL ) injection 50 mg  50 mg Intramuscular TID PRN Mardy Elveria DEL, NP       And   LORazepam  (ATIVAN ) injection 2 mg  2 mg Intramuscular TID PRN Coleman, Carolyn H, NP       divalproex  (DEPAKOTE ) DR tablet 250 mg  250 mg Oral Q12H Nwoko, Agnes I, NP   250 mg at 01/23/24  0825   hydrOXYzine  (ATARAX ) tablet 25 mg  25 mg Oral TID PRN Coleman, Carolyn H, NP   25 mg at 01/22/24 2115   magnesium  hydroxide (MILK OF MAGNESIA) suspension 30 mL  30 mL Oral Daily PRN Coleman, Carolyn H, NP       nicotine  polacrilex (NICORETTE ) gum 2 mg  2 mg Oral PRN Pashayan, Alexander S, DO   2 mg at 01/23/24 1100   paliperidone  (INVEGA ) 24 hr tablet 6 mg  6 mg Oral Daily Nwoko, Mac I, NP   6 mg at 01/23/24 0825   traZODone  (DESYREL ) tablet 100 mg  100 mg Oral QHS PRN Collene Mac I, NP   100 mg at 01/22/24 2049    Lab Results:  Results for orders placed or performed during the hospital encounter of 01/18/24 (from the past 48 hours)  Valproic acid  level     Status: Abnormal   Collection Time: 01/23/24  6:18 AM  Result Value Ref Range   Valproic Acid  Lvl 48 (L) 50 - 100 ug/mL    Comment: Performed at [facility], [address], [place], [place] [address]    Blood Alcohol level:  Lab Results  Component Value Date   [facility] <15 01/17/2024   ETH <10 05/05/2022    Metabolic Disorder Labs: Lab Results  Component Value Date   HGBA1C 5.4 01/20/2024   MPG 108.28 01/20/2024   MPG 114 03/10/2022   No results found for: PROLACTIN Lab Results  Component Value Date   CHOL 106 03/11/2022   TRIG 52 03/11/2022   HDL 38 (L) 03/11/2022   CHOLHDL 2.8 03/11/2022    VLDL 10 03/11/2022   LDLCALC 58 03/11/2022    Physical Findings: AIMS:  ,  ,  ,  ,  ,  ,   CIWA:    COWS:     Musculoskeletal: Strength & Muscle Tone: within normal limits Gait & Station: normal Patient leans: N/A  Psychiatric Specialty Exam:  Presentation  General Appearance:  Appropriate for Environment; Casual  Eye Contact: Good  Speech: Clear and Coherent  Speech Volume: Normal  Handedness: Right   Mood and Affect  Mood: Euthymic  Affect: Congruent   Thought Process  Thought Processes: Coherent; Goal Directed  Descriptions of Associations:Intact  Orientation:Full (Time, Place and Person)  Thought Content:Logical  History of Schizophrenia/Schizoaffective disorder:No data recorded Duration of Psychotic Symptoms:No data recorded Hallucinations:Hallucinations: None  Ideas of Reference:None  Suicidal Thoughts:Suicidal Thoughts: No  Homicidal Thoughts:Homicidal Thoughts: No   Sensorium  Memory: Immediate Good; Recent Good  Judgment: Fair  Insight: Fair   Art Therapist  Concentration: Good  Attention Span: Good  Recall: Good  Fund of Knowledge: Good  Language: Good   Psychomotor Activity  Psychomotor Activity: Psychomotor Activity: Normal   Assets  Assets: Communication Skills; Desire for Improvement; Physical Health; Resilience   Sleep  Sleep: Sleep: Good Number of Hours of Sleep: 8.25    Physical Exam: Physical Exam Vitals and nursing note reviewed.  Constitutional:      General: He is not in acute distress.    Appearance: Normal appearance. He is normal weight. He is not ill-appearing or toxic-appearing.  HENT:     Head: Normocephalic and atraumatic.     Right Ear: External ear normal.     Left Ear: External ear normal.     Nose: Nose normal.     Mouth/Throat:     Mouth: Mucous membranes are moist.     Pharynx: Oropharynx is clear.  Eyes:     Extraocular Movements: Extraocular movements  intact.  Cardiovascular:     Rate and Rhythm: Normal rate.     Pulses: Normal pulses.  Pulmonary:     Effort: Pulmonary effort is normal.  Musculoskeletal:        General: Normal range of motion.     Cervical back: Normal range of motion.  Skin:    General: Skin is dry.  Neurological:     General: No focal deficit present.     Mental Status: He is alert.  Psychiatric:        Mood and Affect: Mood normal.        Behavior: Behavior normal.    Review of Systems  Constitutional:  Negative for chills and fever.  HENT:  Negative for sore throat.   Eyes:  Negative for blurred vision.  Respiratory:  Negative for cough and shortness of breath.   Cardiovascular:  Negative for chest pain.  Gastrointestinal:  Negative for abdominal pain, constipation, diarrhea, nausea and vomiting.  Genitourinary:  Negative for dysuria.  Musculoskeletal:  Negative for falls.  Skin:  Negative for itching and rash.  Neurological:  Negative for dizziness, weakness and headaches.  Endo/Heme/Allergies: Negative.   Psychiatric/Behavioral:  Negative for depression, hallucinations and suicidal ideas. The patient is not nervous/anxious.    Blood pressure 132/71, pulse 77, temperature (!) 97.5 F (36.4 C), temperature source Oral, resp. rate 16, height 5' 7 (1.702 m), weight 56.7 kg, SpO2 98%. Body mass index is 19.58 kg/m.   Treatment Plan Summary: Daily contact with patient to assess and evaluate symptoms and progress in treatment and Medication management  Lane Eland is a 24 yr old male who presented on 1/12 to [facility] under IVC for manic, erratic, and grandiose behavior, he was admitted to [facility] on 1/14.  PPHx is significant for Bipolar Disorder and 1 Prior Psychiatric Hospitalization (2021).   Taishaun is responding well to his medications.  He continues to be elevated/manic and grandiose but is improving.  Given he did well on Invega  Sustenna in the past we will offer this to him tomorrow.  We will not make  any changes to his medications at this time.  We will continue to monitor.    Bipolar Disorder, Manic: -Continue Depakote  DR 250 BID for mood stability -Continue Invega  6 mg daily for mood stability/psychosis -Invega  Sustenna 234 mg injection IM on 01/24/2024 -Will offer Invega  Sustenna   Labs to be obtained: Reviewed new lab results, TSH within norm. Result for hgba1c 01-20-24: 5.4 (within norm).                                       Valproic acid  level on 01-23-24.   Agitation protocols.  -Continue as recommended (see MAR).    Other PRNS -Continue Tylenol  650 mg every 6 hours PRN for mild pain -Continue Maalox 30 ml Q 4 hrs PRN for indigestion -Continue MOM 30 ml po Q 6 hrs for constipation   Safety and Monitoring: Voluntary admission to inpatient psychiatric unit for safety, stabilization and treatment Daily contact with patient to assess and evaluate symptoms and progress in treatment Patient's case to be discussed in multi-disciplinary team meeting Observation Level : q15 minute checks Vital signs: q12 hours Precautions: Safety   Discharge Planning: Social work and case management to assist with discharge planning and identification of hospital follow-up needs prior to discharge Estimated LOS:  5-7 days Discharge Concerns: Need to establish a safety plan; Medication compliance and effectiveness Discharge Goals: Return home with outpatient referrals for mental health follow-up including medication management/psychotherapy   Ellouise JAYSON Azure, FNP 01/23/2024, 2:34 PM Patient ID: Darilyn Monte, male   DOB: 04/10/2000, 24 y.o.   MRN: [id number]

## 2024-01-23 NOTE — Plan of Care (Signed)
 Pt was out in the milieu during the day interacting well with peers but hyperactive and euphoric as well as hyper-verbal. Exhibited some grandiosity. Went to [employer] and ate adequately. Attending group activity and participated. Denies SI/HI/SH/paranoia/AVH. Will continue to monitor.

## 2024-01-23 NOTE — Group Note (Unsigned)
 Date:  01/23/2024 Time:  8:32 PM  Group Topic/Focus:  Wrap-Up Group:   The focus of this group is to help patients review their daily goal of treatment and discuss progress on daily workbooks.     Participation Level:  {BHH PARTICIPATION OZCZO:77735}  Participation Quality:  {BHH PARTICIPATION QUALITY:22265}  Affect:  {BHH AFFECT:22266}  Cognitive:  {BHH COGNITIVE:22267}  Insight: {BHH Insight2:20797}  Engagement in Group:  {BHH ENGAGEMENT IN HMNLE:77731}  Modes of Intervention:  {BHH MODES OF INTERVENTION:22269}  Additional Comments:  ***  Derrick Joseph 01/23/2024, 8:32 PM

## 2024-01-23 NOTE — Progress Notes (Signed)
(  Sleep Hours) -3.75 (Any PRNs that were needed, meds refused, or side effects to meds)- trazodone  and hydroxyzine  (Any disturbances and when (visitation, over night)-no (Concerns raised by the patient)- no (SI/HI/AVH)- denies

## 2024-01-23 NOTE — Group Note (Signed)
 Date:  01/23/2024 Time:  6:07 PM  Group Topic/Focus:  Relapse Prevention Planning:   The focus of this group is to define relapse and discuss the need for planning to combat relapse. Natural ways to get dopamine hits as opposed to illicit drugs which cause psychosis.    Participation Level:  Active  Participation Quality:  Appropriate  Affect:  Appropriate  Cognitive:  Appropriate  Insight: Appropriate  Engagement in Group:  Engaged  Modes of Intervention:  Discussion and Education  Additional Comments:    Derrick Joseph 01/23/2024, 6:07 PM

## 2024-01-23 NOTE — Plan of Care (Signed)
  Problem: Activity: Goal: Sleeping patterns will improve Outcome: Progressing   Problem: Education: Goal: Emotional status will improve Outcome: Not Progressing Goal: Mental status will improve Outcome: Not Progressing   

## 2024-01-23 NOTE — BHH Group Notes (Signed)
 Adult Psychoeducational Group Note  Date:  01/23/2024 Time:  3:07 PM  Group Topic/Focus: Intellectual Wellness  Participation Level:  Active  Participation Quality:    Affect:    Cognitive:    Insight:   Engagement in Group:    Modes of Intervention:    Additional Comments:    Derrick Joseph O 01/23/2024, 3:07 PM

## 2024-01-23 NOTE — Plan of Care (Signed)
   Problem: Education: Goal: Knowledge of [facility] Education information/materials will improve Outcome: Progressing Goal: Emotional status will improve Outcome: Progressing Goal: Mental status will improve Outcome: Progressing Goal: Verbalization of understanding the information provided will improve Outcome: Progressing

## 2024-01-23 NOTE — BHH Group Notes (Signed)
 Adult Psychoeducational Group Note  Date:  01/23/2024 Time:  11:20 AM  Group Topic/Focus:  Goals Group:   The focus of this group is to help patients establish daily goals to achieve during treatment and discuss how the patient can incorporate goal setting into their daily lives to aide in recovery.  Participation Level:  Active  Participation Quality:  Attentive  Affect:  Appropriate  Cognitive:  Appropriate  Insight: Good  Engagement in Group:  Engaged  Modes of Intervention:  Discussion  Additional Comments:    Annett Berle Hoyer 01/23/2024, 11:20 AM

## 2024-01-24 ENCOUNTER — Encounter (HOSPITAL_COMMUNITY): Payer: Self-pay

## 2024-01-24 NOTE — Group Note (Signed)
 Recreation Therapy Group Note   Group Topic:Problem Solving  Group Date: 01/24/2024 Start Time: 1032 End Time: 1055 Facilitators: Chenika Nevils-McCall, LRT,CTRS Location: [address] Dayroom   Group Topic: Communication, Team Building, Problem Solving  Goal Area(s) Addresses:  Patient will effectively work with peer towards shared goal.  Patient will identify skills used to make activity successful.  Patient will identify how skills used during activity can be used to reach post d/c goals.   Behavioral Response: Engaged  Intervention: STEM Activity  Activity: Straw Bridge. In teams of 3-5, patients were given 15 plastic drinking straws and an equal length of masking tape. Using the materials provided, patients were instructed to build a free standing bridge-like structure to suspend an everyday item (ex: puzzle box) off of the floor or table surface. All materials were required to be used by the team in their design. LRT facilitated post-activity discussion reviewing team process. Patients were encouraged to reflect how the skills used in this activity can be generalized to daily life post discharge.   Education: Pharmacist, Community, Scientist, Physiological, Discharge Planning   Education Outcome: Acknowledges education/In group clarification offered/Needs additional education.    Affect/Mood: Appropriate   Participation Level: Engaged   Participation Quality: Independent   Behavior: Appropriate   Speech/Thought Process: Focused   Insight: Good   Judgement: Good   Modes of Intervention: STEM Activity   Patient Response to Interventions:  Engaged   Education Outcome:  In group clarification offered    Clinical Observations/Individualized Feedback: Pt was engaged and worked well with peer. Pt took the idea his peer came up with and put the design of it together. Pt was focused and attentive on getting the bridge built.     Plan: Continue to engage patient in RT group sessions  2-3x/week.   Brandi Tomlinson-McCall, LRT,CTRS 01/24/2024 2:04 PM

## 2024-01-24 NOTE — Group Note (Signed)
 Date:  01/24/2024 Time:  9:43 PM  Group Topic/Focus: Wrap-Up Group:   The focus of this group is to help patients review their daily goal of treatment and discuss progress on daily workbooks.     Participation Level:  Active  Participation Quality:  Appropriate  Affect:  Appropriate  Cognitive:  Appropriate  Insight: Appropriate  Engagement in Group:  Engaged  Modes of Intervention:  Education  Additional Comments:  Patient attended and participated in group tonight.  Gwenn Chillington Dacosta 01/24/2024, 9:43 PM

## 2024-01-24 NOTE — Group Note (Signed)
 Date:  01/24/2024 Time:  11:02 AM  Group Topic/Focus:  Recreational Therapy    Participation Level:  Active   Derrick Joseph Molly 01/24/2024, 11:02 AM

## 2024-01-24 NOTE — Group Note (Unsigned)
 Date:  01/24/2024 Time:  9:12 PM  Group Topic/Focus:  Wrap-Up Group:   The focus of this group is to help patients review their daily goal of treatment and discuss progress on daily workbooks.     Participation Level:  {BHH PARTICIPATION OZCZO:77735}  Participation Quality:  {BHH PARTICIPATION QUALITY:22265}  Affect:  {BHH AFFECT:22266}  Cognitive:  {BHH COGNITIVE:22267}  Insight: {BHH Insight2:20797}  Engagement in Group:  {BHH ENGAGEMENT IN HMNLE:77731}  Modes of Intervention:  {BHH MODES OF INTERVENTION:22269}  Additional Comments:  ***  Gwenn Nobie Brooklyn 01/24/2024, 9:12 PM

## 2024-01-24 NOTE — Group Note (Unsigned)
 Date:  01/24/2024 Time:  8:48 PM  Group Topic/Focus:  Wrap-Up Group:   The focus of this group is to help patients review their daily goal of treatment and discuss progress on daily workbooks.     Participation Level:  {BHH PARTICIPATION OZCZO:77735}  Participation Quality:  {BHH PARTICIPATION QUALITY:22265}  Affect:  {BHH AFFECT:22266}  Cognitive:  {BHH COGNITIVE:22267}  Insight: {BHH Insight2:20797}  Engagement in Group:  {BHH ENGAGEMENT IN HMNLE:77731}  Modes of Intervention:  {BHH MODES OF INTERVENTION:22269}  Additional Comments:  ***  Gwenn Nobie Brooklyn 01/24/2024, 8:48 PM

## 2024-01-24 NOTE — Group Note (Signed)
 Date:  01/24/2024 Time:  2:23 PM  Group Topic/Focus:  Social Work Group    Participation Level:  Active  Merlen Gurry LITTIE Molly 01/24/2024, 2:23 PM

## 2024-01-24 NOTE — Plan of Care (Signed)
   Problem: Education: Goal: Emotional status will improve Outcome: Progressing Goal: Mental status will improve Outcome: Progressing

## 2024-01-24 NOTE — Group Note (Signed)
 Date:  01/24/2024 Time:  12:55 PM  Group Topic/Focus:  Emotional Education:   The focus of this group is to discuss what feelings/emotions are, and how they are experienced.    Participation Level:  Active  Participation Quality:  Appropriate  Affect:  Appropriate  Cognitive:  Appropriate  Insight: Good  Engagement in Group:  Engaged  Modes of Intervention:  Discussion and Education  Additional Comments:  Pt had great feedback.  Logan LITTIE Molly 01/24/2024, 12:55 PM

## 2024-01-24 NOTE — BHH Group Notes (Signed)
 Adult Psychoeducational Group Note  Date:  01/24/2024 Time:  10:20 AM  Group Topic/Focus: Orentation Group Orientation:   The focus of this group is to educate the patient on the purpose and policies of crisis stabilization and provide a format to answer questions about their admission.  The group details unit policies and expectations of patients while admitted.  Participation Level:  Active  Participation Quality:  Appropriate  Affect:  Appropriate  Cognitive:  Appropriate  Insight: Appropriate  Engagement in Group:  Engaged  Modes of Intervention:  Discussion  Additional Comments:  The patient engaged in discussion.  Leo Weyandt Lee 01/24/2024, 10:20 AM

## 2024-01-24 NOTE — BHH Group Notes (Signed)
 Adult Psychoeducational Group Note  Date:  01/24/2024 Time:  10:35 AM  Group Topic/Focus: Recreation Group   Participation Level:  Attended Group    Derrick Joseph 01/24/2024, 10:35 AM

## 2024-01-24 NOTE — Group Note (Signed)
 LCSW Group Therapy Note   Group Date: 01/24/2024 Start Time: 1300 End Time: 1400   Participation:  patient was present and actively participated in the discussion.  Type of Therapy:  Group Therapy  Topic:  Shining from Within:  Confidence and Self-Love Journey.  Objective:  Promote Self-Awareness and Realistic Self-Talk: Help participants recognize their strengths and replace negative thoughts with truthful, realistic statements to build confidence.  Goals: Increase Confidence: Help participants develop a positive self-image by focusing on their strengths and personal progress. Set Achievable Goals: Guide participants in creating small, realistic goals that foster a sense of accomplishment and build momentum. Enhance Self-Care Practices: Encourage participants to incorporate self-care activities into their routine to support emotional well-being and reinforce confidence.  Summary:  The Shining from Within: Confidence and Self-Love Journey group helped individuals build stronger self-confidence through truthful, realistic self-talk, goal-setting, and self-care practices. Participants recognized their unique strengths, set achievable goals, and nurtured a supportive environment for mutual growth. The group provided practical tools to foster lasting confidence and self-belief, empowering each member to shine from within and embrace their personal power with greater self-assurance.  Therapeutic Modalities:   Cognitive Behavioral Therapy (CBT): Identify and reframe negative self-talk into realistic, positive thoughts Strengths-Based Therapy: Focus on personal strengths, successes, and abilities Psychoeducation: Teach concepts of self-esteem, confidence, and healthy self-talk   Catherene KIDD Legend Tumminello, LCSW 01/24/2024  2:22 PM

## 2024-01-24 NOTE — Progress Notes (Signed)
 [facility] MD Progress Note  01/24/2024 10:44 AM Derrick Joseph  MRN:  [id number] Subjective:   Derrick Joseph is a 24 yr old male who presented on 1/12 to [facility] under IVC for manic, erratic, and grandiose behavior, he was admitted to [facility] on 1/14.  PPHx is significant for Bipolar Disorder and 1 Prior Psychiatric Hospitalization (2021).  24-hour chart review: Case was discussed in the multidisciplinary team. MAR was reviewed and patient was compliant with medications.  He received PRN Hydroxyzine  x 2 for anxiety, Tylenol  x 1 for mild pain, nicotine  gum x 3 for smoking cessation and Trazodone  x 1 for sleep.  Compliant with his scheduled psychotropic medications.  Psychiatric Team made the following recommendations yesterday: -Continue Depakote  DR 250 BID for mood stability -Continue Invega  6 mg daily for mood stability/psychosis -- Invega  Sustenna 234 mg LAI 01/24/2024  Today's assessment notes: Zerick reports mood is euthymic with pleasant affect.  Patient reports to this provider that he received his Invega  Sustenna 234 mg LAI today and he is grateful.  Inquiring when he will be discharged from the hospital.  Made patient aware that his symptoms of psychosis are improving and would like to see it minimize prior to discharge from the hospital.  Patient reports, I am not rushing you guys because even while here at the hospital, I am still being paid wages.  However, I will like to go home soon so as to resume my work.  Compliant with scheduled psychotropic medications.  Denies any acute discomfort and continues to report that God is good.  Observed attending therapeutic milieu and unit group activities and learning some coping skills of deep breathing and working on activities when distressed.  Reports sleep is stable, and nursing staff documented patient sleeping over 3.75 hours last night.  When addressed sleep hours with patient, reports, I slept more than what was reported and I feel good.  I was already  in bed sleeping prior to my nighttime medications.  I think I slept more than 6 hours last night.  Anxiety is at manageable level, and rates both anxiety and her depression as numbers 0/10, with 10 being high severity.  Appetite is good, concentration is better and energy level is adequate.  Patient denies delusional thinking or paranoia.  He further denies SI, HI, or AVH.  Psychosocial interventions :  -Motivational interviewing   -Daily medication management with psychiatry  -Medication education regarding risks/benefits and alternatives  -Bedside psychotherapy as indicated   -Patient will be encouraged to participate and engage with group therapy   -Appreciate SW assistance in coordinating safe disposition   -Anticipated LOS 5-7 days    Principal Problem: Bipolar disorder, current episode manic without psychotic features (HCC) Diagnosis: Principal Problem:   Bipolar disorder, current episode manic without psychotic features (HCC) Active Problems:   Cannabis use disorder, moderate, in controlled environment (HCC)   Bipolar 1 disorder (HCC)  Total Time spent with patient: 35 minutes  Past Psychiatric History:  Bipolar Disorder and 1 Prior Psychiatric Hospitalization (2021).  Past Medical History: History reviewed. No pertinent past medical history. History reviewed. No pertinent surgical history. Family History: History reviewed. No pertinent family history. Family Psychiatric  History:  Bipolar disorder: Paternal cousin. Psych Rx: did not report SA/HA: Smokes weed. Substance use: in father, denies SUD in family otherwise  Inpatient psych admission: sister and paternal cousin, patient did not report a psychiatric diagnosis for them. Rehab: did not report  Social History:  Social History   Substance and  Sexual Activity  Alcohol Use Not Currently     Social History   Substance and Sexual Activity  Drug Use Not Currently    Social History   Socioeconomic History    Marital status: Single    Spouse name: Not on file   Number of children: Not on file   Years of education: Not on file   Highest education level: Not on file  Occupational History   Not on file  Tobacco Use   Smoking status: Every Day    Types: Cigarettes    Passive exposure: Current   Smokeless tobacco: Never  Vaping Use   Vaping status: Every Day   Substances: Nicotine , Flavoring  Substance and Sexual Activity   Alcohol use: Not Currently   Drug use: Not Currently   Sexual activity: Yes    Birth control/protection: Condom  Other Topics Concern   Not on file  Social History Narrative   Not on file   Social Drivers of Health   Tobacco Use: High Risk (01/18/2024)   Patient History    Smoking Tobacco Use: Every Day    Smokeless Tobacco Use: Never    Passive Exposure: Current  Financial Resource Strain: Not on file  Food Insecurity: No Food Insecurity (01/18/2024)   Epic    Worried About Programme Researcher, Broadcasting/film/video in the Last Year: Never true    Ran Out of Food in the Last Year: Never true  Transportation Needs: No Transportation Needs (01/18/2024)   Epic    Lack of Transportation (Medical): No    Lack of Transportation (Non-Medical): No  Physical Activity: Not on file  Stress: Not on file  Social Connections: Not on file  Depression (PHQ2-9): Low Risk (08/18/2022)   Depression (PHQ2-9)    PHQ-2 Score: 0  Alcohol Screen: Low Risk (01/18/2024)   Alcohol Screen    Last Alcohol Screening Score (AUDIT): 0  Housing: Low Risk (01/18/2024)   Epic    Unable to Pay for Housing in the Last Year: No    Number of Times Moved in the Last Year: 0    Homeless in the Last Year: No  Utilities: Not At Risk (01/18/2024)   Epic    Threatened with loss of utilities: No  Health Literacy: Not on file   Additional Social History:    Sleep: Good Estimated Sleeping Duration (Last 24 Hours): 3.00-3.50 hours  Appetite:  Good  Current Medications: Current Facility-Administered Medications   Medication Dose Route Frequency Provider Last Rate Last Admin   acetaminophen  (TYLENOL ) tablet 650 mg  650 mg Oral Q6H PRN Coleman, Carolyn H, NP   650 mg at 01/22/24 1639   alum & mag hydroxide-simeth (MAALOX/MYLANTA) 200-200-20 MG/5ML suspension 30 mL  30 mL Oral Q4H PRN Coleman, Carolyn H, NP       haloperidol  (HALDOL ) tablet 5 mg  5 mg Oral TID PRN Coleman, Carolyn H, NP   5 mg at 01/19/24 1748   And   diphenhydrAMINE  (BENADRYL ) capsule 50 mg  50 mg Oral TID PRN Coleman, Carolyn H, NP   50 mg at 01/19/24 1748   haloperidol  lactate (HALDOL ) injection 5 mg  5 mg Intramuscular TID PRN Mardy Elveria DEL, NP       And   diphenhydrAMINE  (BENADRYL ) injection 50 mg  50 mg Intramuscular TID PRN Mardy Elveria DEL, NP       And   LORazepam  (ATIVAN ) injection 2 mg  2 mg Intramuscular TID PRN Mardy Elveria DEL, NP  haloperidol  lactate (HALDOL ) injection 10 mg  10 mg Intramuscular TID PRN Mardy Elveria DEL, NP       And   diphenhydrAMINE  (BENADRYL ) injection 50 mg  50 mg Intramuscular TID PRN Mardy Elveria DEL, NP       And   LORazepam  (ATIVAN ) injection 2 mg  2 mg Intramuscular TID PRN Coleman, Carolyn H, NP       divalproex  (DEPAKOTE ) DR tablet 250 mg  250 mg Oral Q12H Nwoko, Mac I, NP   250 mg at 01/24/24 9155   hydrOXYzine  (ATARAX ) tablet 25 mg  25 mg Oral TID PRN Coleman, Carolyn H, NP   25 mg at 01/23/24 2111   magnesium  hydroxide (MILK OF MAGNESIA) suspension 30 mL  30 mL Oral Daily PRN Coleman, Carolyn H, NP       nicotine  polacrilex (NICORETTE ) gum 2 mg  2 mg Oral PRN Pashayan, Alexander S, DO   2 mg at 01/24/24 0715   paliperidone  (INVEGA ) 24 hr tablet 6 mg  6 mg Oral Daily Nwoko, Agnes I, NP   6 mg at 01/24/24 9155   traZODone  (DESYREL ) tablet 100 mg  100 mg Oral QHS PRN Collene Mac I, NP   100 mg at 01/23/24 2111   Lab Results:  Results for orders placed or performed during the hospital encounter of 01/18/24 (from the past 48 hours)  Valproic acid  level     Status: Abnormal    Collection Time: 01/23/24  6:18 AM  Result Value Ref Range   Valproic Acid  Lvl 48 (L) 50 - 100 ug/mL    Comment: Performed at [facility], [address], [place], [place] [address]   Blood Alcohol level:  Lab Results  Component Value Date   [facility] <15 01/17/2024   ETH <10 05/05/2022    Metabolic Disorder Labs: Lab Results  Component Value Date   HGBA1C 5.4 01/20/2024   MPG 108.28 01/20/2024   MPG 114 03/10/2022   No results found for: PROLACTIN Lab Results  Component Value Date   CHOL 106 03/11/2022   TRIG 52 03/11/2022   HDL 38 (L) 03/11/2022   CHOLHDL 2.8 03/11/2022   VLDL 10 03/11/2022   LDLCALC 58 03/11/2022   Physical Findings: AIMS:  ,  ,  ,  ,  ,  ,   CIWA:    COWS:     Musculoskeletal: Strength & Muscle Tone: within normal limits Gait & Station: normal Patient leans: N/A  Psychiatric Specialty Exam:  Presentation  General Appearance:  Appropriate for Environment; Casual; Fairly Groomed  Eye Contact: Good  Speech: Clear and Coherent  Speech Volume: Normal  Handedness: Right  Mood and Affect  Mood: Euthymic  Affect: Appropriate; Congruent  Thought Process  Thought Processes: Coherent  Descriptions of Associations:Intact  Orientation:Full (Time, Place and Person)  Thought Content:Logical  History of Schizophrenia/Schizoaffective disorder:No data recorded Duration of Psychotic Symptoms:No data recorded Hallucinations:Hallucinations: None  Ideas of Reference:None  Suicidal Thoughts:Suicidal Thoughts: No  Homicidal Thoughts:Homicidal Thoughts: No  Sensorium  Memory: Immediate Good; Recent Good  Judgment: Fair  Insight: Fair  Art Therapist  Concentration: Good  Attention Span: Good  Recall: Good  Fund of Knowledge: Good  Language: Good  Psychomotor Activity  Psychomotor Activity: Psychomotor Activity: Normal  Assets  Assets: Communication Skills; Desire for Improvement;  Physical Health; Resilience  Sleep  Sleep: Sleep: Fair Number of Hours of Sleep: 3.75  Physical Exam: Physical Exam Vitals and nursing note reviewed.  Constitutional:  General: He is not in acute distress.    Appearance: Normal appearance. He is normal weight. He is not ill-appearing or toxic-appearing.  HENT:     Head: Normocephalic and atraumatic.     Right Ear: External ear normal.     Left Ear: External ear normal.     Nose: Nose normal.     Mouth/Throat:     Mouth: Mucous membranes are moist.     Pharynx: Oropharynx is clear.  Eyes:     Extraocular Movements: Extraocular movements intact.  Cardiovascular:     Rate and Rhythm: Normal rate.     Pulses: Normal pulses.  Pulmonary:     Effort: Pulmonary effort is normal.  Musculoskeletal:        General: Normal range of motion.     Cervical back: Normal range of motion.  Skin:    General: Skin is dry.  Neurological:     General: No focal deficit present.     Mental Status: He is alert.  Psychiatric:        Mood and Affect: Mood normal.        Behavior: Behavior normal.    Review of Systems  Constitutional:  Negative for chills and fever.  HENT:  Negative for sore throat.   Eyes:  Negative for blurred vision.  Respiratory:  Negative for cough and shortness of breath.   Cardiovascular:  Negative for chest pain.  Gastrointestinal:  Negative for abdominal pain, constipation, diarrhea, nausea and vomiting.  Genitourinary:  Negative for dysuria.  Musculoskeletal:  Negative for falls.  Skin:  Negative for itching and rash.  Neurological:  Negative for dizziness, weakness and headaches.  Endo/Heme/Allergies: Negative.   Psychiatric/Behavioral:  Negative for depression, hallucinations and suicidal ideas. The patient has insomnia. The patient is not nervous/anxious.    Blood pressure 129/72, pulse 72, temperature 97.9 F (36.6 C), temperature source Oral, resp. rate 16, height 5' 7 (1.702 m), weight 56.7 kg, SpO2  100%. Body mass index is 19.58 kg/m.  Treatment Plan Summary: Daily contact with patient to assess and evaluate symptoms and progress in treatment and Medication management  Kyrillos Adams is a 24 yr old male who presented on 1/12 to [facility] under IVC for manic, erratic, and grandiose behavior, he was admitted to [facility] on 1/14.  PPHx is significant for Bipolar Disorder and 1 Prior Psychiatric Hospitalization (2021).  Younis is responding well to his medications.  He continues to be elevated/manic and grandiose but is improving.  Given he did well on Invega  Sustenna in the past we will offer this to him tomorrow.  We will not make any changes to his medications at this time.  We will continue to monitor.   Bipolar Disorder, Manic: -Continue Depakote  DR 250 BID for mood stability -Continue Invega  6 mg daily for mood stability/psychosis -Invega  Sustenna 234 mg injection IM on 01/24/2024 given. -Will offer Invega  Sustenna  Labs to be obtained: Reviewed new lab results, TSH within norm. Result for hgba1c 01-20-24: 5.4 (within norm).                                       Valproic acid  level on 01-23-24.   Agitation protocols.  -Continue as recommended (see MAR).    Other PRNS -Continue Tylenol  650 mg every 6 hours PRN for mild pain -Continue Maalox 30 ml Q 4 hrs PRN for indigestion -Continue MOM 30 ml po  Q 6 hrs for constipation   Safety and Monitoring: Voluntary admission to inpatient psychiatric unit for safety, stabilization and treatment Daily contact with patient to assess and evaluate symptoms and progress in treatment Patient's case to be discussed in multi-disciplinary team meeting Observation Level : q15 minute checks Vital signs: q12 hours Precautions: Safety   Discharge Planning: Social work and case management to assist with discharge planning and identification of hospital follow-up needs prior to discharge Estimated LOS: 5-7 days Discharge Concerns: Need to establish a safety  plan; Medication compliance and effectiveness Discharge Goals: Return home with outpatient referrals for mental health follow-up including medication management/psychotherapy   Ellouise JAYSON Azure, FNP 01/24/2024, 10:44 AM Patient ID: Darilyn Monte, male   DOB: 08-26-2000, 24 y.o.   MRN: [id number] Patient ID: Asar Evilsizer, male   DOB: [birth year], 24 y.o.   MRN: [id number]

## 2024-01-24 NOTE — BH IP Treatment Plan (Signed)
 Interdisciplinary Treatment and Diagnostic Plan Update  01/24/2024 Time of Session: THIS IS AN UPDATE Derrick Joseph MRN: [id number]  Principal Diagnosis: Bipolar disorder, current episode manic without psychotic features (HCC)  Secondary Diagnoses: Principal Problem:   Bipolar disorder, current episode manic without psychotic features (HCC) Active Problems:   Cannabis use disorder, moderate, in controlled environment (HCC)   Bipolar 1 disorder (HCC)   Current Medications:  Current Facility-Administered Medications  Medication Dose Route Frequency Provider Last Rate Last Admin   acetaminophen  (TYLENOL ) tablet 650 mg  650 mg Oral Q6H PRN Coleman, Carolyn H, NP   650 mg at 01/22/24 1639   alum & mag hydroxide-simeth (MAALOX/MYLANTA) 200-200-20 MG/5ML suspension 30 mL  30 mL Oral Q4H PRN Coleman, Carolyn H, NP       haloperidol  (HALDOL ) tablet 5 mg  5 mg Oral TID PRN Coleman, Carolyn H, NP   5 mg at 01/19/24 1748   And   diphenhydrAMINE  (BENADRYL ) capsule 50 mg  50 mg Oral TID PRN Coleman, Carolyn H, NP   50 mg at 01/19/24 1748   haloperidol  lactate (HALDOL ) injection 5 mg  5 mg Intramuscular TID PRN Mardy Elveria DEL, NP       And   diphenhydrAMINE  (BENADRYL ) injection 50 mg  50 mg Intramuscular TID PRN Mardy Elveria DEL, NP       And   LORazepam  (ATIVAN ) injection 2 mg  2 mg Intramuscular TID PRN Coleman, Carolyn H, NP       haloperidol  lactate (HALDOL ) injection 10 mg  10 mg Intramuscular TID PRN Mardy Elveria DEL, NP       And   diphenhydrAMINE  (BENADRYL ) injection 50 mg  50 mg Intramuscular TID PRN Mardy Elveria DEL, NP       And   LORazepam  (ATIVAN ) injection 2 mg  2 mg Intramuscular TID PRN Coleman, Carolyn H, NP       divalproex  (DEPAKOTE ) DR tablet 250 mg  250 mg Oral Q12H Nwoko, Mac I, NP   250 mg at 01/24/24 0844   hydrOXYzine  (ATARAX ) tablet 25 mg  25 mg Oral TID PRN Coleman, Carolyn H, NP   25 mg at 01/23/24 2111   magnesium  hydroxide (MILK OF MAGNESIA) suspension 30  mL  30 mL Oral Daily PRN Coleman, Carolyn H, NP       nicotine  polacrilex (NICORETTE ) gum 2 mg  2 mg Oral PRN Pashayan, Alexander S, DO   2 mg at 01/24/24 0715   paliperidone  (INVEGA ) 24 hr tablet 6 mg  6 mg Oral Daily Nwoko, Agnes I, NP   6 mg at 01/24/24 0844   traZODone  (DESYREL ) tablet 100 mg  100 mg Oral QHS PRN Collene Mac I, NP   100 mg at 01/23/24 2111   PTA Medications: No medications prior to admission.    Patient Stressors: Other: UTA    Patient Strengths: Other: UTA  Treatment Modalities: Medication Management, Group therapy, Case management,  1 to 1 session with clinician, Psychoeducation, Recreational therapy.   Physician Treatment Plan for Primary Diagnosis: Bipolar disorder, current episode manic without psychotic features (HCC) Long Term Goal(s): Improvement in symptoms so as ready for discharge   Short Term Goals: Ability to identify and develop effective coping behaviors will improve Ability to maintain clinical measurements within normal limits will improve Compliance with prescribed medications will improve Ability to identify triggers associated with substance abuse/mental health issues will improve Ability to identify changes in lifestyle to reduce recurrence of condition will improve Ability to verbalize feelings  will improve Ability to disclose and discuss suicidal ideas Ability to demonstrate self-control will improve  Medication Management: Evaluate patient's response, side effects, and tolerance of medication regimen.  Therapeutic Interventions: 1 to 1 sessions, Unit Group sessions and Medication administration.  Evaluation of Outcomes: Progressing  Physician Treatment Plan for Secondary Diagnosis: Principal Problem:   Bipolar disorder, current episode manic without psychotic features (HCC) Active Problems:   Cannabis use disorder, moderate, in controlled environment (HCC)   Bipolar 1 disorder (HCC)  Long Term Goal(s): Improvement in symptoms so  as ready for discharge   Short Term Goals: Ability to identify and develop effective coping behaviors will improve Ability to maintain clinical measurements within normal limits will improve Compliance with prescribed medications will improve Ability to identify triggers associated with substance abuse/mental health issues will improve Ability to identify changes in lifestyle to reduce recurrence of condition will improve Ability to verbalize feelings will improve Ability to disclose and discuss suicidal ideas Ability to demonstrate self-control will improve     Medication Management: Evaluate patient's response, side effects, and tolerance of medication regimen.  Therapeutic Interventions: 1 to 1 sessions, Unit Group sessions and Medication administration.  Evaluation of Outcomes: Progressing   RN Treatment Plan for Primary Diagnosis: Bipolar disorder, current episode manic without psychotic features (HCC) Long Term Goal(s): Knowledge of disease and therapeutic regimen to maintain health will improve  Short Term Goals: Ability to participate in decision making will improve, Ability to verbalize feelings will improve, and Ability to identify and develop effective coping behaviors will improve  Medication Management: RN will administer medications as ordered by provider, will assess and evaluate patient's response and provide education to patient for prescribed medication. RN will report any adverse and/or side effects to prescribing provider.  Therapeutic Interventions: 1 on 1 counseling sessions, Psychoeducation, Medication administration, Evaluate responses to treatment, Monitor vital signs and CBGs as ordered, Perform/monitor CIWA, COWS, AIMS and Fall Risk screenings as ordered, Perform wound care treatments as ordered.  Evaluation of Outcomes: Progressing   LCSW Treatment Plan for Primary Diagnosis: Bipolar disorder, current episode manic without psychotic features (HCC) Long Term  Goal(s): Safe transition to appropriate next level of care at discharge, Engage patient in therapeutic group addressing interpersonal concerns.  Short Term Goals: Engage patient in aftercare planning with referrals and resources, Increase ability to appropriately verbalize feelings, Increase emotional regulation, Facilitate acceptance of mental health diagnosis and concerns, and Increase skills for wellness and recovery  Therapeutic Interventions: Assess for all discharge needs, 1 to 1 time with Social worker, Explore available resources and support systems, Assess for adequacy in community support network, Educate family and significant other(s) on suicide prevention, Complete Psychosocial Assessment, Interpersonal group therapy.  Evaluation of Outcomes: Progressing   Progress in Treatment: Attending groups: Yes. Participating in groups: Yes. Taking medication as prescribed: Yes. Toleration medication: Yes. Family/Significant other contact made: Yes, contacted: Derrick Joseph Patient understands diagnosis: No. Discussing patient identified problems/goals with staff: Yes. Medical problems stabilized or resolved: Yes. Denies suicidal/homicidal ideation: Yes. Issues/concerns per patient self-inventory: No.   New problem(s) identified: No, Describe:  none   New Short Term/Long Term Goal(s): medication stabilization, elimination of SI thoughts, development of comprehensive mental wellness plan.      Patient Goals:  I needed a reset my goal here is done   Discharge Plan or Barriers: Patient recently admitted. CSW will continue to follow and assess for appropriate referrals and possible discharge planning.      Reason for Continuation of  Hospitalization: Delusions  Mania Medication stabilization   Estimated Length of Stay: 4-6 days  Last 3 [place] Suicide Severity Risk Score: Flowsheet Row [facility] (Current[facility] from [facility]/17/2024 in [facility] at [facility] ED from 07/07/2023 in [facility] Department at [facility]  C-SSRS RISK CATEGORY No Risk No Risk No Risk    Last PHQ 2/9 Scores:    08/18/2022    9:08 AM 08/18/2022    9:00 AM 04/27/2022    8:37 AM  Depression screen PHQ 2/9  Decreased Interest 0 0 0  Down, Depressed, Hopeless 0 0 0  PHQ - 2 Score 0 0 0  Altered sleeping 0 0 0  Tired, decreased energy 0 0 0  Change in appetite 0 0   Feeling bad or failure about yourself  0 0 0  Trouble concentrating 0 0 0  Moving slowly or fidgety/restless 0 0 0  Suicidal thoughts 0 0 0  PHQ-9 Score 0  0  0   Difficult doing work/chores Not difficult at all Not difficult at all Not difficult at all     Data saved with a previous flowsheet row definition    Scribe for Treatment Team: Jenkins LULLA Primer, LCSWA 01/24/2024 10:26 AM

## 2024-01-25 DIAGNOSIS — F1220 Cannabis dependence, uncomplicated: Secondary | ICD-10-CM

## 2024-01-25 DIAGNOSIS — F3110 Bipolar disorder, current episode manic without psychotic features, unspecified: Principal | ICD-10-CM

## 2024-01-25 NOTE — Plan of Care (Signed)
  Problem: Activity: Goal: Interest or engagement in activities will improve Outcome: Progressing Goal: Sleeping patterns will improve Outcome: Progressing   Problem: Coping: Goal: Ability to verbalize frustrations and anger appropriately will improve Outcome: Progressing   

## 2024-01-25 NOTE — [facility] Group Notes (Signed)
 Adult Psychoeducational Group Note  Date:  01/25/2024 Time:  1:09 PM  Group Topic/Focus:  Goals Group:   The focus of this group is to help patients establish daily goals to achieve during treatment and discuss how the patient can incorporate goal setting into their daily lives to aide in recovery. Orientation:   The focus of this group is to educate the patient on the purpose and policies of crisis stabilization and provide a format to answer questions about their admission.  The group details unit policies and expectations of patients while admitted.  Participation Level:  Active  Participation Quality:  Appropriate  Affect:  Appropriate  Cognitive:  Appropriate  Insight: Appropriate  Engagement in Group:  Engaged  Modes of Intervention:  Discussion  Additional Comments:  Pt attended the goals group and remained appropriate and engaged throughout the duration of the group.   Myosha Cuadras O 01/25/2024, 1:09 PM

## 2024-01-25 NOTE — [facility] Group Notes (Signed)
 Adult Psychoeducational Group Note  Date:  01/25/2024 Time:  4:32 PM  Group Topic/Focus: Emotional Wellness  Participation Level:  Active  Participation Quality:  Appropriate  Affect:  Appropriate  Cognitive:  Appropriate  Insight: Appropriate  Engagement in Group:  Engaged  Modes of Intervention:  Discussion  Additional Comments:  Pt attended the emotional wellness group and remained appropriate and engaged throughout the duration of the group.   Derrick Joseph 01/25/2024, 4:32 PM

## 2024-01-25 NOTE — [facility] Group Notes (Signed)
 Adult Psychoeducational Group Note  Date:  01/25/2024 Time:  4:32 PM  Group Topic/Focus: Pharmacy group  Participation Level:  Active  Participation Quality:    Affect:    Cognitive:    Insight:   Engagement in Group:    Modes of Intervention:    Additional Comments:    Sade Mehlhoff O 01/25/2024, 4:32 PM

## 2024-01-25 NOTE — Group Note (Signed)
 Recreation Therapy Group Note   Group Topic:Team Building  Group Date: 01/25/2024 Start Time: 1042 End Time: 1109 Facilitators: Prabhav Faulkenberry-McCall, LRT,CTRS Location: 500 Hall Dayroom   Group Topic: Communication, Team Building, Problem Solving  Goal Area(s) Addresses:  Patient will effectively work with peer towards shared goal.  Patient will identify skills used to make activity successful.  Patient will identify how skills used during activity can be used to reach post d/c goals.   Behavioral Response: Engaged, Attentive, Appropriate   Intervention: Teambuilding Activity  Activity: Lily Pad. Working as a administrator, civil service, patients were asked to use colored discs to get the entire team from one end of the hall way to the other. Patients were only allowed to move down and back the hallway by stepping on the discs, patient team was provided 1 additional disc to assist with them completing task.    Education: Pharmacist, Community, Scientist, Physiological, Discharge Planning   Education Outcome: Acknowledges education.    Affect/Mood: Appropriate   Participation Level: Engaged   Participation Quality: Independent   Behavior: Appropriate   Speech/Thought Process: Focused   Insight: Good   Judgement: Good   Modes of Intervention: Team-building   Patient Response to Interventions:  Engaged   Education Outcome:  In group clarification offered    Clinical Observations/Individualized Feedback: Pt was focused and engaged during activity. Pt was attentive to all the ideas that were put into action before helping come up with the idea the helped the group complete the activity.      Plan: Continue to engage patient in RT group sessions 2-3x/week.   Aldine Grainger-McCall, LRT,CTRS 01/25/2024 2:17 PM

## 2024-01-25 NOTE — Progress Notes (Signed)
 [facility] MD Progress Note  01/25/2024 9:45 AM Garlen Reinig  MRN:  [id number] Subjective:   Derrick Joseph is a 24 yr old male who presented on 1/12 to [facility] under IVC for manic, erratic, and grandiose behavior, he was admitted to [facility] on 1/14.  PPHx is significant for Bipolar Disorder and 1 Prior Psychiatric Hospitalization (2021).  24-hour chart review: Case was discussed in the multidisciplinary team. MAR was reviewed and patient was compliant with medications.  He received PRN Hydroxyzine  x 2 for anxiety, Tylenol  x 1 for mild pain, nicotine  gum x 3 for smoking cessation and Trazodone  x 1 for sleep.  Compliant with his scheduled psychotropic medications.  Psychiatric Team made the following recommendations yesterday: -Continue Depakote  DR 250 BID for mood stability -Continue Invega  6 mg daily for mood stability/psychosis -- Invega  Sustenna 234 mg LAI 01/24/2024  Today's assessment notes: Jesper reports mood is euthymic with pleasant affect.  Patient reports to this provider that he received his Invega  Sustenna 234 mg LAI today and he is grateful.  Inquiring when he will be discharged from the hospital.  Made patient aware that his symptoms of psychosis are improving and would like to see it minimize prior to discharge from the hospital.  Patient reports, I am not rushing you guys because even while here at the hospital, I am still being paid wages.  However, I will like to go home soon so as to resume my work.  Compliant with scheduled psychotropic medications.  Denies any acute discomfort and continues to report that God is good.  Observed attending therapeutic milieu and unit group activities and learning some coping skills of deep breathing and working on activities when distressed.  Reports sleep is stable, and nursing staff documented patient sleeping over 3.75 hours last night.  When addressed sleep hours with patient, reports, I slept more than what was reported and I feel good.  I was already  in bed sleeping prior to my nighttime medications.  I think I slept more than 6 hours last night.  Anxiety is at manageable level, and rates both anxiety and her depression as numbers 0/10, with 10 being high severity.  Appetite is good, concentration is better and energy level is adequate.  Patient denies delusional thinking or paranoia.  He further denies SI, HI, or AVH.  Psychosocial interventions :  -Motivational interviewing   -Daily medication management with psychiatry  -Medication education regarding risks/benefits and alternatives  -Bedside psychotherapy as indicated   -Patient will be encouraged to participate and engage with group therapy   -Appreciate SW assistance in coordinating safe disposition   -Anticipated LOS 5-7 days    Principal Problem: Bipolar disorder, current episode manic without psychotic features (HCC) Diagnosis: Principal Problem:   Bipolar disorder, current episode manic without psychotic features (HCC) Active Problems:   Cannabis use disorder, moderate, in controlled environment (HCC)   Bipolar 1 disorder (HCC)  Total Time spent with patient: 35 minutes  Past Psychiatric History:  Bipolar Disorder and 1 Prior Psychiatric Hospitalization (2021).  Past Medical History: History reviewed. No pertinent past medical history. History reviewed. No pertinent surgical history. Family History: History reviewed. No pertinent family history. Family Psychiatric  History:  Bipolar disorder: Paternal cousin. Psych Rx: did not report SA/HA: Smokes weed. Substance use: in father, denies SUD in family otherwise  Inpatient psych admission: sister and paternal cousin, patient did not report a psychiatric diagnosis for them. Rehab: did not report  Social History:  Social History   Substance and  Sexual Activity  Alcohol Use Not Currently     Social History   Substance and Sexual Activity  Drug Use Not Currently    Social History   Socioeconomic History    Marital status: Single    Spouse name: Not on file   Number of children: Not on file   Years of education: Not on file   Highest education level: Not on file  Occupational History   Not on file  Tobacco Use   Smoking status: Every Day    Types: Cigarettes    Passive exposure: Current   Smokeless tobacco: Never  Vaping Use   Vaping status: Every Day   Substances: Nicotine , Flavoring  Substance and Sexual Activity   Alcohol use: Not Currently   Drug use: Not Currently   Sexual activity: Yes    Birth control/protection: Condom  Other Topics Concern   Not on file  Social History Narrative   Not on file   Social Drivers of Health   Tobacco Use: High Risk (01/18/2024)   Patient History    Smoking Tobacco Use: Every Day    Smokeless Tobacco Use: Never    Passive Exposure: Current  Financial Resource Strain: Not on file  Food Insecurity: No Food Insecurity (01/18/2024)   Epic    Worried About Programme Researcher, Broadcasting/film/video in the Last Year: Never true    Ran Out of Food in the Last Year: Never true  Transportation Needs: No Transportation Needs (01/18/2024)   Epic    Lack of Transportation (Medical): No    Lack of Transportation (Non-Medical): No  Physical Activity: Not on file  Stress: Not on file  Social Connections: Not on file  Depression (PHQ2-9): Low Risk (08/18/2022)   Depression (PHQ2-9)    PHQ-2 Score: 0  Alcohol Screen: Low Risk (01/18/2024)   Alcohol Screen    Last Alcohol Screening Score (AUDIT): 0  Housing: Low Risk (01/18/2024)   Epic    Unable to Pay for Housing in the Last Year: No    Number of Times Moved in the Last Year: 0    Homeless in the Last Year: No  Utilities: Not At Risk (01/18/2024)   Epic    Threatened with loss of utilities: No  Health Literacy: Not on file   Additional Social History:    Sleep: Good Estimated Sleeping Duration (Last 24 Hours): 5.25-6.00 hours  Appetite:  Good  Current Medications: Current Facility-Administered Medications   Medication Dose Route Frequency Provider Last Rate Last Admin   acetaminophen  (TYLENOL ) tablet 650 mg  650 mg Oral Q6H PRN Coleman, Carolyn H, NP   650 mg at 01/22/24 1639   alum & mag hydroxide-simeth (MAALOX/MYLANTA) 200-200-20 MG/5ML suspension 30 mL  30 mL Oral Q4H PRN Coleman, Carolyn H, NP       haloperidol  (HALDOL ) tablet 5 mg  5 mg Oral TID PRN Coleman, Carolyn H, NP   5 mg at 01/19/24 1748   And   diphenhydrAMINE  (BENADRYL ) capsule 50 mg  50 mg Oral TID PRN Coleman, Carolyn H, NP   50 mg at 01/19/24 1748   haloperidol  lactate (HALDOL ) injection 5 mg  5 mg Intramuscular TID PRN Mardy Elveria DEL, NP       And   diphenhydrAMINE  (BENADRYL ) injection 50 mg  50 mg Intramuscular TID PRN Mardy Elveria DEL, NP       And   LORazepam  (ATIVAN ) injection 2 mg  2 mg Intramuscular TID PRN Mardy Elveria DEL, NP  haloperidol  lactate (HALDOL ) injection 10 mg  10 mg Intramuscular TID PRN Mardy Elveria DEL, NP       And   diphenhydrAMINE  (BENADRYL ) injection 50 mg  50 mg Intramuscular TID PRN Mardy Elveria DEL, NP       And   LORazepam  (ATIVAN ) injection 2 mg  2 mg Intramuscular TID PRN Coleman, Carolyn H, NP       divalproex  (DEPAKOTE ) DR tablet 250 mg  250 mg Oral Q12H Nwoko, Agnes I, NP   250 mg at 01/25/24 9167   hydrOXYzine  (ATARAX ) tablet 25 mg  25 mg Oral TID PRN Coleman, Carolyn H, NP   25 mg at 01/24/24 2119   magnesium  hydroxide (MILK OF MAGNESIA) suspension 30 mL  30 mL Oral Daily PRN Coleman, Carolyn H, NP       nicotine  polacrilex (NICORETTE ) gum 2 mg  2 mg Oral PRN Pashayan, Alexander S, DO   2 mg at 01/24/24 0715   paliperidone  (INVEGA ) 24 hr tablet 6 mg  6 mg Oral Daily Nwoko, Agnes I, NP   6 mg at 01/25/24 9166   traZODone  (DESYREL ) tablet 100 mg  100 mg Oral QHS PRN Collene Gouge I, NP   100 mg at 01/24/24 2119   Lab Results:  No results found for this or any previous visit (from the past 48 hours).  Blood Alcohol level:  Lab Results  Component Value Date   [facility] <15  01/17/2024   ETH <10 05/05/2022    Metabolic Disorder Labs: Lab Results  Component Value Date   HGBA1C 5.4 01/20/2024   MPG 108.28 01/20/2024   MPG 114 03/10/2022   No results found for: PROLACTIN Lab Results  Component Value Date   CHOL 106 03/11/2022   TRIG 52 03/11/2022   HDL 38 (L) 03/11/2022   CHOLHDL 2.8 03/11/2022   VLDL 10 03/11/2022   LDLCALC 58 03/11/2022   Physical Findings: AIMS:  ,  ,  ,  ,  ,  ,   CIWA:    COWS:     Musculoskeletal: Strength & Muscle Tone: within normal limits Gait & Station: normal Patient leans: N/A  Psychiatric Specialty Exam:  Presentation  General Appearance:  Appropriate for Environment; Casual; Fairly Groomed  Eye Contact: Good  Speech: Clear and Coherent  Speech Volume: Normal  Handedness: Right  Mood and Affect  Mood: Euthymic  Affect: Appropriate; Congruent  Thought Process  Thought Processes: Coherent  Descriptions of Associations:Intact  Orientation:Full (Time, Place and Person)  Thought Content:Logical  History of Schizophrenia/Schizoaffective disorder:No data recorded Duration of Psychotic Symptoms:No data recorded Hallucinations:Hallucinations: None  Ideas of Reference:None  Suicidal Thoughts:Suicidal Thoughts: No  Homicidal Thoughts:No data recorded  Sensorium  Memory: Immediate Good; Recent Good  Judgment: Fair  Insight: Fair  Art Therapist  Concentration: Good  Attention Span: Good  Recall: Good  Fund of Knowledge: Good  Language: Good  Psychomotor Activity  Psychomotor Activity: Psychomotor Activity: Normal  Assets  Assets: Communication Skills; Desire for Improvement; Physical Health; Resilience  Sleep  Sleep: Sleep: Fair Number of Hours of Sleep: 3.75  Physical Exam: Physical Exam Vitals and nursing note reviewed.  Constitutional:      General: He is not in acute distress.    Appearance: Normal appearance. He is normal weight. He is not  ill-appearing or toxic-appearing.  HENT:     Head: Normocephalic and atraumatic.     Right Ear: External ear normal.     Left Ear: External ear normal.  Nose: Nose normal.     Mouth/Throat:     Mouth: Mucous membranes are moist.     Pharynx: Oropharynx is clear.  Eyes:     Extraocular Movements: Extraocular movements intact.  Cardiovascular:     Rate and Rhythm: Normal rate.     Pulses: Normal pulses.  Pulmonary:     Effort: Pulmonary effort is normal.  Musculoskeletal:        General: Normal range of motion.     Cervical back: Normal range of motion.  Skin:    General: Skin is dry.  Neurological:     General: No focal deficit present.     Mental Status: He is alert.  Psychiatric:        Mood and Affect: Mood normal.        Behavior: Behavior normal.    Review of Systems  Constitutional:  Negative for chills and fever.  HENT:  Negative for sore throat.   Eyes:  Negative for blurred vision.  Respiratory:  Negative for cough and shortness of breath.   Cardiovascular:  Negative for chest pain.  Gastrointestinal:  Negative for abdominal pain, constipation, diarrhea, nausea and vomiting.  Genitourinary:  Negative for dysuria.  Musculoskeletal:  Negative for falls.  Skin:  Negative for itching and rash.  Neurological:  Negative for dizziness, weakness and headaches.  Endo/Heme/Allergies: Negative.   Psychiatric/Behavioral:  Negative for depression, hallucinations and suicidal ideas. The patient has insomnia. The patient is not nervous/anxious.    Blood pressure 133/83, pulse 74, temperature 97.9 F (36.6 C), temperature source Oral, resp. rate 16, height 5' 7 (1.702 m), weight 56.7 kg, SpO2 100%. Body mass index is 19.58 kg/m.  Treatment Plan Summary: Daily contact with patient to assess and evaluate symptoms and progress in treatment and Medication management  Kali Ambler is a 24 yr old male who presented on 1/12 to [facility] under IVC for manic, erratic, and grandiose  behavior, he was admitted to [facility] on 1/14.  PPHx is significant for Bipolar Disorder and 1 Prior Psychiatric Hospitalization (2021).  Nader is responding well to his medications.  He continues to be elevated/manic and grandiose but is improving.  Given he did well on Invega  Sustenna in the past we will offer this to him tomorrow.  We will not make any changes to his medications at this time.  We will continue to monitor.   Bipolar Disorder, Manic: -Continue Depakote  DR 250 BID for mood stability -Continue Invega  6 mg daily for mood stability/psychosis -Invega  Sustenna 234 mg injection IM on 01/24/2024 given. -Will offer Invega  Sustenna  Labs to be obtained: Reviewed new lab results, TSH within norm. Result for hgba1c 01-20-24: 5.4 (within norm).                                       Valproic acid  level on 01-23-24.   Agitation protocols.  -Continue as recommended (see MAR).    Other PRNS -Continue Tylenol  650 mg every 6 hours PRN for mild pain -Continue Maalox 30 ml Q 4 hrs PRN for indigestion -Continue MOM 30 ml po Q 6 hrs for constipation   Safety and Monitoring: Voluntary admission to inpatient psychiatric unit for safety, stabilization and treatment Daily contact with patient to assess and evaluate symptoms and progress in treatment Patient's case to be discussed in multi-disciplinary team meeting Observation Level : q15 minute checks Vital signs: q12 hours Precautions: Safety  Discharge Planning: Social work and case management to assist with discharge planning and identification of hospital follow-up needs prior to discharge Estimated LOS: 5-7 days Discharge Concerns: Need to establish a safety plan; Medication compliance and effectiveness Discharge Goals: Return home with outpatient referrals for mental health follow-up including medication management/psychotherapy   Blair Chiquita Hint, NP 01/25/2024, 9:45 AM Patient ID: Darilyn Monte, male   DOB: [birth year], 24 y.o.    MRN: [id number] Patient ID: Renardo Cheatum, male   DOB: 06-15-2000, 24 y.o.   MRN: [id number] Patient ID: Brodan Grewell, male   DOB: 25-Jun-2000, 24 y.o.   MRN: [id number]

## 2024-01-25 NOTE — Group Note (Signed)
 Recreation Therapy Group Note   Group Topic:Animal Assisted Therapy   Group Date: 01/25/2024 Start Time: 9049 End Time: 1031 Facilitators: Ahnyla Mendel-McCall, LRT,CTRS Location: [address] Dayroom   Animal-Assisted Activity (AAA) Program Checklist/Progress Notes Patient Eligibility Criteria Checklist & Daily Group note for Rec Tx Intervention  AAA/T Program Assumption of Risk Form signed by Patient/ or Parent Legal Guardian Yes  Patient is free of allergies or severe asthma Yes  Patient reports no fear of animals Yes  Patient reports no history of cruelty to animals Yes  Patient understands his/her participation is voluntary Yes  Patient washes hands before animal contact Yes  Patient washes hands after animal contact Yes  Behavioral Response: Engaged   Education: Charity Fundraiser, Appropriate Animal Interaction   Education Outcome: Acknowledges education.    Affect/Mood: Appropriate   Participation Level: Engaged   Participation Quality: Independent   Behavior: Appropriate   Speech/Thought Process: Focused   Insight: Good   Judgement: Good   Modes of Intervention: Teaching Laboratory Technician   Patient Response to Interventions:  Engaged   Education Outcome:  In group clarification offered    Clinical Observations/Individualized Feedback: Patient attended session and interacted appropriately with therapy dog and peers. Patient asked appropriate questions about therapy dog and his training. Patient shared stories about their pets at home with group.     Plan: Continue to engage patient in RT group sessions 2-3x/week.   Alyssandra Hulsebus-McCall, LRT,CTRS  01/25/2024 1:44 PM

## 2024-01-25 NOTE — Plan of Care (Signed)

## 2024-01-25 NOTE — Group Note (Signed)
 Date:  01/25/2024 Time:  8:50 PM  Group Topic/Focus:  Wrap-Up Group:   The focus of this group is to help patients review their daily goal of treatment and discuss progress on daily workbooks.    Participation Level:  Active  Participation Quality:  Appropriate and Sharing  Affect:  Appropriate  Cognitive:  Appropriate  Insight: Appropriate  Engagement in Group:  Engaged  Modes of Intervention:  Discussion and Socialization  Additional Comments:  Patient shared one high point and one low point from their day. One high point from patient day, god woke me up this morning and low point, arm hurting due to getting a shot. Patient rated his day a 10 out of 10. Patient shared one thing that he has learned while here, to relax, be calm and breath.   Eward Mace 01/25/2024, 8:50 PM

## 2024-01-25 NOTE — [facility] Group Notes (Signed)
 Adult Psychoeducational Group Note  Date:  01/25/2024 Time:  1:09 PM  Group Topic/Focus: Recreation therapy  Participation Level:  Active  Participation Quality:  Appropriate  Affect:  Appropriate  Cognitive:  Appropriate  Insight: Appropriate  Engagement in Group:  Engaged  Modes of Intervention:  Discussion  Additional Comments:  Pt attended the rec therapy group and remained appropriate and engaged throughout the duration of the group.   Chyrl Elwell O 01/25/2024, 1:09 PM

## 2024-01-25 NOTE — Plan of Care (Signed)
   Problem: Education: Goal: Emotional status will improve Outcome: Progressing Goal: Mental status will improve Outcome: Progressing   Problem: Activity: Goal: Sleeping patterns will improve Outcome: Progressing

## 2024-01-25 NOTE — Progress Notes (Signed)
(  Sleep Hours) -4.75 (Any PRNs that were needed, meds refused, or side effects to meds)- trazodone  and hydroxyzine  (Any disturbances and when (visitation, over night)-no (Concerns raised by the patient)- no (SI/HI/AVH)- denies

## 2024-01-25 NOTE — BH Assessment (Signed)
(  Sleep Hours) - 6.5 (Any PRNs that were needed, meds refused, or side effects to meds)-  (Any disturbances and when (visitation, over night)- None (Concerns raised by the patient)- Hyperactive (SI/HI/AVH)- Denies

## 2024-01-26 MED ORDER — PALIPERIDONE PALMITATE ER 156 MG/ML IM SUSY: 156.0000 mg | PREFILLED_SYRINGE | INTRAMUSCULAR | Status: DC

## 2024-01-26 MED ORDER — PALIPERIDONE PALMITATE ER 156 MG/ML IM SUSY
156.0000 mg | PREFILLED_SYRINGE | Freq: Once | INTRAMUSCULAR | Status: AC
  Administered 2024-01-27: 156 mg via INTRAMUSCULAR

## 2024-01-26 NOTE — Group Note (Signed)
 Date:  01/26/2024 Time:  10:51 AM  Group Topic/Focus:  Early Warning Signs:   The focus of this group is to help patients identify signs or symptoms they exhibit before slipping into an unhealthy state or crisis. Emotional Education:   The focus of this group is to discuss what feelings/emotions are, and how they are experienced. Healthy Communication:   The focus of this group is to discuss communication, barriers to communication, as well as healthy ways to communicate with others.    Participation Level:  Active  Participation Quality:  Attentive  Affect:  Appropriate  Cognitive:  Appropriate  Insight: Appropriate  Engagement in Group:  Engaged  Modes of Intervention:  Activity, Clarification, Discussion, Education, and Socialization During this group session, we watched a TED Talk titled Stop the Stigma: Why Its Important to Talk About Mental Health. We discussed the importance of destigmatizing harmful language, such as using terms like crazy, when referring to individuals with mental health challenges. Participants were encouraged to share their own perspectives on why open conversations about mental health are important and why seeking help should be normalized without judgment or negative assumptions. This patient states that instead of using the word crazy that we should just say that we just different.   Additional Comments:    Meg BRAVO Myalynn Lingle 01/26/2024, 10:51 AM

## 2024-01-26 NOTE — [facility] Suicide Risk Assessment (Signed)
 [facility] INPATIENT:  Family/Significant Other Suicide Prevention Education   Collateral contact - Grayce Rosaline Bohr (mother), [phone]   Mom said that patient doesn't have access to firearms, and there are no firearms in the home.  Mom said patient will stay with her for a few days.   CSW informed mom that patient will be discharged on Thursday, 01/27/2024 at 11:00 AM.  Mom said that she will be at work, but confirmed that when patient arrives via taxi, he will be able to get into the home (patient confirmed that he arranged it with mom).  Mom's address:  [address], [place], [place] [address]   Catherene Dynes, [place] 01/26/2024

## 2024-01-26 NOTE — Progress Notes (Signed)
 [facility] MD Progress Note  01/26/2024 1:43 PM Derrick Joseph  MRN:  [id number]  Reason for Admission: Derrick Joseph is a 24 yr old male who presented on 1/12 to [facility] under IVC for manic, erratic, and grandiose behavior, he was admitted to [facility] on 1/14.  PPHx is significant for Bipolar Disorder and 1 Prior Psychiatric Hospitalization (2021).  Daily Note: Derrick Joseph is seen in his room this morning. Chart reviewed. The chart findings discussed with the treatment team during the team meeting this afternoon. He presents alert, oriented & aware of situation. He is visible on the unit, attending group sessions. He presents with a good affect, good eye contact & verbally responsive.He is well groomed. He reports, I'm doing well. I'm sleeping well at night. I have no symptoms of depression or anxiety. I do not get depressed. The only complaint I have is pain to the site of my injection that I got yesterday. I think I'm suppose to receive another injection before I can go home. My mom told me that when I get discharged, that I can come & stay with her for few days. I might do that, but I got my own apartment though. I think I should do as she said, just for few days. Other than the report of pain to his injection site, Derrick Joseph currently denies any symptoms of depression or anxiety. He denies any SIHI, AVH, delusional thoughts or paranoia. He does not appear to be responding to any internal stimuli. And for the pain to his injection site, patient is informed that the pain will get less & less till he can't feel it any more. He is also informed to report to his nurse if the pain intensity increases. At the present time, he denies any side effects to his medications. Derrick Joseph will receive the second Invega  injection tomorrow & may be discharged afterwards. New lab order for lipid panel for tomorrow morning in place. His vital signs remains stable.   Principal Problem: Bipolar disorder, current episode manic without psychotic  features (HCC) Diagnosis: Principal Problem:   Bipolar disorder, current episode manic without psychotic features (HCC) Active Problems:   Cannabis use disorder, moderate, in controlled environment (HCC)   Bipolar 1 disorder (HCC)  Total Time spent with patient: 45 minutes  Past Psychiatric History:  Bipolar Disorder and 1 Prior Psychiatric Hospitalization (2021).  Past Medical History: History reviewed. No pertinent past medical history. History reviewed. No pertinent surgical history. Family History: History reviewed. No pertinent family history. Family Psychiatric  History:  Bipolar disorder: Paternal cousin. Psych Rx: did not report SA/HA: Smokes weed. Substance use: in father, denies SUD in family otherwise  Inpatient psych admission: sister and paternal cousin, patient did not report a psychiatric diagnosis for them. Rehab: did not report  Social History:  Social History   Substance and Sexual Activity  Alcohol Use Not Currently     Social History   Substance and Sexual Activity  Drug Use Not Currently    Social History   Socioeconomic History   Marital status: Single    Spouse name: Not on file   Number of children: Not on file   Years of education: Not on file   Highest education level: Not on file  Occupational History   Not on file  Tobacco Use   Smoking status: Every Day    Types: Cigarettes    Passive exposure: Current   Smokeless tobacco: Never  Vaping Use   Vaping status: Every Day   Substances: Nicotine ,  Flavoring  Substance and Sexual Activity   Alcohol use: Not Currently   Drug use: Not Currently   Sexual activity: Yes    Birth control/protection: Condom  Other Topics Concern   Not on file  Social History Narrative   Not on file   Social Drivers of Health   Tobacco Use: High Risk (01/18/2024)   Patient History    Smoking Tobacco Use: Every Day    Smokeless Tobacco Use: Never    Passive Exposure: Current  Financial Resource Strain:  Not on file  Food Insecurity: No Food Insecurity (01/18/2024)   Epic    Worried About Programme Researcher, Broadcasting/film/video in the Last Year: Never true    Ran Out of Food in the Last Year: Never true  Transportation Needs: No Transportation Needs (01/18/2024)   Epic    Lack of Transportation (Medical): No    Lack of Transportation (Non-Medical): No  Physical Activity: Not on file  Stress: Not on file  Social Connections: Not on file  Depression (PHQ2-9): Low Risk (08/18/2022)   Depression (PHQ2-9)    PHQ-2 Score: 0  Alcohol Screen: Low Risk (01/18/2024)   Alcohol Screen    Last Alcohol Screening Score (AUDIT): 0  Housing: Low Risk (01/18/2024)   Epic    Unable to Pay for Housing in the Last Year: No    Number of Times Moved in the Last Year: 0    Homeless in the Last Year: No  Utilities: Not At Risk (01/18/2024)   Epic    Threatened with loss of utilities: No  Health Literacy: Not on file   Additional Social History:    Sleep: Good Estimated Sleeping Duration (Last 24 Hours): 3.75-4.50 hours  Appetite:  Good  Current Medications: Current Facility-Administered Medications  Medication Dose Route Frequency Provider Last Rate Last Admin   acetaminophen  (TYLENOL ) tablet 650 mg  650 mg Oral Q6H PRN Coleman, Carolyn H, NP   650 mg at 01/26/24 0344   alum & mag hydroxide-simeth (MAALOX/MYLANTA) 200-200-20 MG/5ML suspension 30 mL  30 mL Oral Q4H PRN Coleman, Carolyn H, NP       haloperidol  (HALDOL ) tablet 5 mg  5 mg Oral TID PRN Coleman, Carolyn H, NP   5 mg at 01/19/24 1748   And   diphenhydrAMINE  (BENADRYL ) capsule 50 mg  50 mg Oral TID PRN Coleman, Carolyn H, NP   50 mg at 01/19/24 1748   haloperidol  lactate (HALDOL ) injection 5 mg  5 mg Intramuscular TID PRN Mardy Elveria DEL, NP       And   diphenhydrAMINE  (BENADRYL ) injection 50 mg  50 mg Intramuscular TID PRN Mardy Elveria DEL, NP       And   LORazepam  (ATIVAN ) injection 2 mg  2 mg Intramuscular TID PRN Mardy Elveria DEL, NP        haloperidol  lactate (HALDOL ) injection 10 mg  10 mg Intramuscular TID PRN Mardy Elveria DEL, NP       And   diphenhydrAMINE  (BENADRYL ) injection 50 mg  50 mg Intramuscular TID PRN Mardy Elveria DEL, NP       And   LORazepam  (ATIVAN ) injection 2 mg  2 mg Intramuscular TID PRN Coleman, Carolyn H, NP       divalproex  (DEPAKOTE ) DR tablet 250 mg  250 mg Oral Q12H Sandrika Schwinn I, NP   250 mg at 01/26/24 0813   hydrOXYzine  (ATARAX ) tablet 25 mg  25 mg Oral TID PRN Coleman, Carolyn H, NP   25 mg  at 01/25/24 2101   magnesium  hydroxide (MILK OF MAGNESIA) suspension 30 mL  30 mL Oral Daily PRN Coleman, Carolyn H, NP       nicotine  polacrilex (NICORETTE ) gum 2 mg  2 mg Oral PRN Pashayan, Alexander S, DO   2 mg at 01/26/24 0815   [START ON 01/27/2024] paliperidone  (INVEGA  SUSTENNA) injection 156 mg  156 mg Intramuscular Once Bennett, Christal H, NP       Followed by   NOREEN ON 02/25/2024] paliperidone  (INVEGA  SUSTENNA) injection 156 mg  156 mg Intramuscular Q30 days Bennett, Christal H, NP       paliperidone  (INVEGA ) 24 hr tablet 6 mg  6 mg Oral Daily Lujuana Kapler I, NP   6 mg at 01/26/24 0813   traZODone  (DESYREL ) tablet 100 mg  100 mg Oral QHS PRN Collene Gouge I, NP   100 mg at 01/25/24 2102   Lab Results:  No results found for this or any previous visit (from the past 48 hours).  Blood Alcohol level:  Lab Results  Component Value Date   [facility] <15 01/17/2024   ETH <10 05/05/2022    Metabolic Disorder Labs: Lab Results  Component Value Date   HGBA1C 5.4 01/20/2024   MPG 108.28 01/20/2024   MPG 114 03/10/2022   No results found for: PROLACTIN Lab Results  Component Value Date   CHOL 106 03/11/2022   TRIG 52 03/11/2022   HDL 38 (L) 03/11/2022   CHOLHDL 2.8 03/11/2022   VLDL 10 03/11/2022   LDLCALC 58 03/11/2022   Physical Findings: AIMS:  ,  ,  ,  ,  ,  ,   CIWA:    COWS:     Musculoskeletal: Strength & Muscle Tone: within normal limits Gait & Station: normal Patient leans:  N/A  Psychiatric Specialty Exam:  Presentation  General Appearance:  Appropriate for Environment; Casual; Well Groomed  Eye Contact: Good  Speech: Clear and Coherent; Normal Rate  Speech Volume: Normal  Handedness: Right  Mood and Affect  Mood: Euthymic  Affect: Appropriate; Congruent  Thought Process  Thought Processes: Coherent; Goal Directed; Linear  Descriptions of Associations:Intact  Orientation:Full (Time, Place and Person)  Thought Content:Logical  History of Schizophrenia/Schizoaffective disorder:No data recorded Duration of Psychotic Symptoms:No data recorded Hallucinations:Hallucinations: None   Ideas of Reference:None  Suicidal Thoughts:Suicidal Thoughts: No   Homicidal Thoughts:Homicidal Thoughts: No   Sensorium  Memory: Immediate Good; Recent Good; Remote Good  Judgment: -- (Improving.)  Insight: Present  Executive Functions  Concentration: Good  Attention Span: Good  Recall: Good  Fund of Knowledge: Fair  Language: Good  Psychomotor Activity  Psychomotor Activity: Psychomotor Activity: Normal   Assets  Assets: Communication Skills; Desire for Improvement; Financial Resources/Insurance; Housing; Physical Health; Resilience; Social Support  Sleep  Sleep: Sleep: Good Number of Hours of Sleep: 8   Physical Exam: Physical Exam Vitals and nursing note reviewed.  Constitutional:      General: He is not in acute distress.    Appearance: Normal appearance. He is normal weight. He is not ill-appearing or toxic-appearing.  HENT:     Head: Normocephalic and atraumatic.     Right Ear: External ear normal.     Left Ear: External ear normal.     Nose: Nose normal.     Mouth/Throat:     Mouth: Mucous membranes are moist.     Pharynx: Oropharynx is clear.  Eyes:     Extraocular Movements: Extraocular movements intact.  Cardiovascular:  Rate and Rhythm: Normal rate.     Pulses: Normal pulses.  Pulmonary:      Effort: Pulmonary effort is normal.  Musculoskeletal:        General: Normal range of motion.     Cervical back: Normal range of motion.  Skin:    General: Skin is dry.  Neurological:     General: No focal deficit present.     Mental Status: He is alert.  Psychiatric:        Mood and Affect: Mood normal.        Behavior: Behavior normal.    Review of Systems  Constitutional:  Negative for chills and fever.  HENT:  Negative for sore throat.   Eyes:  Negative for blurred vision.  Respiratory:  Negative for cough and shortness of breath.   Cardiovascular:  Negative for chest pain.  Gastrointestinal:  Negative for abdominal pain, constipation, diarrhea, nausea and vomiting.  Genitourinary:  Negative for dysuria.  Musculoskeletal:  Negative for falls.  Skin:  Negative for itching and rash.  Neurological:  Negative for dizziness, weakness and headaches.  Endo/Heme/Allergies: Negative.   Psychiatric/Behavioral:  Negative for depression, hallucinations and suicidal ideas. The patient has insomnia. The patient is not nervous/anxious.    Blood pressure 138/69, pulse 75, temperature 98.4 F (36.9 C), temperature source Oral, resp. rate 16, height 5' 7 (1.702 m), weight 56.7 kg, SpO2 100%. Body mass index is 19.58 kg/m.  Treatment Plan Summary: Daily contact with patient to assess and evaluate symptoms and progress in treatment and Medication management  Derrick Joseph is a 24 yr old male who presented on 1/12 to [facility] under IVC for manic, erratic, and grandiose behavior, he was admitted to [facility] on 1/14.  PPHx is significant for Bipolar Disorder and 1 Prior Psychiatric Hospitalization (2021).  Derrick Joseph is responding well to his medications.  He continues to be elevated/manic and grandiose but is improving.  Given he did well on Invega  Sustenna in the past we will offer this to him tomorrow.  We will not make any changes to his medications at this time.  We will continue to monitor.   Bipolar  Disorder, Manic: -Continue Depakote  DR 250 BID for mood stability -Continue Invega  6 mg daily for mood stability/psychosis -Invega  Sustenna 234 mg injection IM on 01/24/2024 given. -Invega  Sustenna 156 mg IM (2ND Dose) 01/27/24 -Invega  Sustenna 156 mg IM monthly maintenance dose (Due 02/25/2024).  -Will offer Invega  Sustenna  Labs to be obtained: Reviewed new lab results, TSH within norm. Result for hgba1c 01-20-24: 5.4 (within norm).                                       Valproic acid  level on 01-23-24.   Agitation protocols.  -Continue as recommended (see MAR).    Other PRNS -Continue Tylenol  650 mg every 6 hours PRN for mild pain -Continue Maalox 30 ml Q 4 hrs PRN for indigestion -Continue MOM 30 ml po Q 6 hrs for constipation   Safety and Monitoring: Voluntary admission to inpatient psychiatric unit for safety, stabilization and treatment Daily contact with patient to assess and evaluate symptoms and progress in treatment Patient's case to be discussed in multi-disciplinary team meeting Observation Level : q15 minute checks Vital signs: q12 hours Precautions: Safety   Discharge Planning: Social work and case management to assist with discharge planning and identification of hospital follow-up needs prior to  discharge Estimated LOS: 5-7 days Discharge Concerns: Need to establish a safety plan; Medication compliance and effectiveness Discharge Goals: Return home with outpatient referrals for mental health follow-up including medication management/psychotherapy   Mac Bolster, NP 01/26/2024, 1:43 PM Patient ID: Darilyn Monte, male   DOB: [birth year], 24 y.o.   MRN: [id number] Patient ID: Alaster Asfaw, male   DOB: 21-Jul-2000, 24 y.o.   MRN: [id number] Patient ID: Shamond Skelton, male   DOB: 08/10/2000, 24 y.o.   MRN: [id number] Patient ID: Guss Farruggia, male   DOB: 11-Jan-2000, 24 y.o.   MRN: [id number]

## 2024-01-26 NOTE — Group Note (Unsigned)
 Date:  01/26/2024 Time:  2:23 PM  Group Topic/Focus:  Nursing Group   Participation Level:  {BHH PARTICIPATION OZCZO:77735}  Participation Quality:  {BHH PARTICIPATION QUALITY:22265}  Affect:  {BHH AFFECT:22266}  Cognitive:  {BHH COGNITIVE:22267}  Insight: {BHH Insight2:20797}  Engagement in Group:  {BHH ENGAGEMENT IN HMNLE:77731}  Modes of Intervention:  {BHH MODES OF INTERVENTION:22269}  Additional Comments:  ***  Tim Corriher L Riyanna Crutchley 01/26/2024, 2:23 PM

## 2024-01-26 NOTE — Group Note (Signed)
 Date:  01/26/2024 Time:  9:30 AM  Group Topic/Focus:  Goals Group:   The focus of this group is to help patients establish daily goals to achieve during treatment and discuss how the patient can incorporate goal setting into their daily lives to aide in recovery. Orientation:   The focus of this group is to educate the patient on the purpose and policies of crisis stabilization and provide a format to answer questions about their admission.  The group details unit policies and expectations of patients while admitted.    Participation Level:  Active  Participation Quality:  Appropriate  Affect:  Appropriate  Cognitive:  Appropriate  Insight: Appropriate  Engagement in Group:  Engaged  Modes of Intervention:  Education and Orientation  Additional Comments:  The patient attended group, during which unit rules and expectations were reviewed. The group also discussed personal interests to promote bonding among patients; the patient shared an interest in playing golf saying it is a nice release.  Additionally, the chain of command was reviewed to ensure patients feel heard and supported. The patients care team was discussed, along with expectations regarding their treatment and care.  Temeka Pore E Manisha Cancel 01/26/2024, 9:30 AM

## 2024-01-26 NOTE — Group Note (Signed)
 Date:  01/26/2024 Time:  2:14 PM  Group Topic/Focus:  Early Warning Signs:   The focus of this group is to help patients identify signs or symptoms they exhibit before slipping into an unhealthy state or crisis. Emotional Education:   The focus of this group is to discuss what feelings/emotions are, and how they are experienced. Healthy Communication:   The focus of this group is to discuss communication, barriers to communication, as well as healthy ways to communicate with others.    Participation Level:  Active  Participation Quality:  Attentive  Affect:  Appropriate  Cognitive:  Appropriate  Insight: Appropriate  Engagement in Group:  Engaged  Modes of Intervention:  Activity and Discussion  Additional Comments:  This group we did an Emotional Wellness group. During this group we watched a TED talk Stop Chasing Purpose and Focus on Wellness by [employer]. During this group I asked the patient what does wellness look like to you? The patient responded and said wellness looks like staying awake, aware and mentally focused.  All the patients agreed that structure is very important in order to take care of their self.   Borden Thune E Gedalya Jim 01/26/2024, 2:14 PM

## 2024-01-26 NOTE — Progress Notes (Signed)
 D: Patient is alert, oriented, hyper, pleasant, and cooperative. Denies SI, HI, AVH, and verbally contracts for safety. Patient reports he slept good last night with sleeping medication. Patient reports his appetite as good, energy level as normal, and concentration as good. Patient rates his depression 0/10, hopelessness 0/10, and anxiety 0/10. Patient denies physical symptoms/pain. Patient states his goal is move in silence.   A: Scheduled medications administered per MD order. Support provided. Patient educated on safety on the unit and medications. Routine safety checks every 15 minutes. Patient stated understanding to tell nurse about any new physical symptoms. Patient understands to tell staff of any needs.     R: No adverse drug reactions noted. Patient remains safe at this time and will continue to monitor.    01/26/24 1100  Psych Admission Type (Psych Patients Only)  Admission Status Involuntary  Psychosocial Assessment  Patient Complaints None  Eye Contact Fair  Facial Expression Animated  Affect Appropriate to circumstance  Speech Logical/coherent  Interaction Assertive  Motor Activity Other (Comment) (WNL)  Appearance/Hygiene Unremarkable  Behavior Characteristics Hyperactive  Mood Pleasant  Thought Process  Coherency Flight of ideas  Content WDL  Delusions None reported or observed  Perception Derealization  Hallucination None reported or observed  Judgment Limited  Confusion None  Danger to Self  Current suicidal ideation? Denies  Danger to Others  Danger to Others None reported or observed

## 2024-01-26 NOTE — Group Note (Signed)
 Date:  01/26/2024 Time:  4:02 PM  Group Topic/Focus:  Mental Health Group     Participation Level:  Active  Participation Quality:  Appropriate  Affect:  Appropriate  Cognitive:  Appropriate  Insight: Appropriate  Engagement in Group:  Engaged  Modes of Intervention:  Education  Additional Comments:  Pt attended group and watch the video and participated   Brody Kump E Demetria Lightsey 01/26/2024, 4:02 PM

## 2024-01-26 NOTE — Group Note (Signed)
 Date:  01/26/2024 Time:  4:42 PM  Group Topic/Focus:  Emotional Wellness    Participation Level:  Active  Participation Quality:  Appropriate  Affect:  Appropriate  Cognitive:  Appropriate  Insight: Appropriate  Engagement in Group:  Engaged  Modes of Intervention:  Discussion  Additional Comments:  Pt attended group and participated with the questions asked.   Alexes Menchaca E Yashas Camilli 01/26/2024, 4:42 PM

## 2024-01-26 NOTE — Group Note (Signed)
 Date:  01/26/2024 Time:  4:29 PM  Group Topic/Focus:  Nurse group     Participation Level:  Active  Meg FORBES Ellen 01/26/2024, 4:29 PM

## 2024-01-26 NOTE — Plan of Care (Signed)
  Problem: Education: Goal: Emotional status will improve Outcome: Progressing   Problem: Activity: Goal: Interest or engagement in activities will improve Outcome: Progressing Goal: Sleeping patterns will improve Outcome: Progressing

## 2024-01-26 NOTE — Group Note (Signed)
 Date:  01/26/2024 Time:  12:02 PM  Group Topic/Focus:  Making Healthy Choices:   The focus of this group is to help patients identify negative/unhealthy choices they were using prior to admission and identify positive/healthier coping strategies to replace them upon discharge. This group was about physical wellness and moving our bodies around for cardio.     Participation Level:  Active  Participation Quality:  Attentive  Affect:  Appropriate  Cognitive:  Appropriate  Insight: Appropriate  Engagement in Group:  Engaged  Modes of Intervention:  Activity  Additional Comments:  This group was about physical wellness so we were doing line dances as well as some workouts like pushups to just get blood flowing.   [name] [name] 01/26/2024, 12:02 PM

## 2024-01-26 NOTE — Group Note (Unsigned)
 Date:  01/26/2024 Time:  2:22 PM  Group Topic/Focus:  Early Warning Signs:   The focus of this group is to help patients identify signs or symptoms they exhibit before slipping into an unhealthy state or crisis. Emotional Education:   The focus of this group is to discuss what feelings/emotions are, and how they are experienced.     Participation Level:  {BHH PARTICIPATION OZCZO:77735}  Participation Quality:  {BHH PARTICIPATION QUALITY:22265}  Affect:  {BHH AFFECT:22266}  Cognitive:  {BHH COGNITIVE:22267}  Insight: {BHH Insight2:20797}  Engagement in Group:  {BHH ENGAGEMENT IN HMNLE:77731}  Modes of Intervention:  {BHH MODES OF INTERVENTION:22269}  Additional Comments:  ***  Derrick Joseph 01/26/2024, 2:22 PM

## 2024-01-26 NOTE — Group Note (Signed)
 Date:  01/26/2024 Time:  5:22 PM  Group Topic/Focus:  Making Healthy Choices:   The focus of this group is to help patients identify negative/unhealthy choices they were using prior to admission and identify positive/healthier coping strategies to replace them upon discharge. Things that we can do to improve overall brain health.      Participation Level:  Did Not Attend   Juliene CHRISTELLA Huddle 01/26/2024, 5:22 PM

## 2024-01-26 NOTE — Progress Notes (Signed)
(  Sleep Hours) -6 (Any PRNs that were needed, meds refused, or side effects to meds)-  trazodone  and hydroxyzine  (Any disturbances and when (visitation, over night)-no (Concerns raised by the patient)- no (SI/HI/AVH)- denies

## 2024-01-26 NOTE — Plan of Care (Signed)

## 2024-01-27 LAB — LIPID PANEL
Cholesterol: 124 mg/dL (ref 0–200)
HDL: 70 mg/dL
LDL Cholesterol: 46 mg/dL (ref 0–99)
Total CHOL/HDL Ratio: 1.8 ratio
Triglycerides: 41 mg/dL
VLDL: 8 mg/dL (ref 0–40)

## 2024-01-27 MED ORDER — PALIPERIDONE PALMITATE ER 156 MG/ML IM SUSY: PREFILLED_SYRINGE | INTRAMUSCULAR | 0 refills | Status: AC

## 2024-01-27 MED ORDER — NICOTINE POLACRILEX 2 MG MT GUM: 2.0000 mg | CHEWING_GUM | OROMUCOSAL | Status: AC | PRN

## 2024-01-27 MED ORDER — HYDROXYZINE HCL 25 MG PO TABS: 25.0000 mg | ORAL_TABLET | Freq: Three times a day (TID) | ORAL | 0 refills | Status: AC | PRN

## 2024-01-27 MED ORDER — DIVALPROEX SODIUM 250 MG PO DR TAB: 250.0000 mg | DELAYED_RELEASE_TABLET | Freq: Two times a day (BID) | ORAL | 0 refills | Status: AC

## 2024-01-27 MED ORDER — TRAZODONE HCL 100 MG PO TABS: 100.0000 mg | ORAL_TABLET | Freq: Every evening | ORAL | 0 refills | Status: AC | PRN

## 2024-01-27 NOTE — Group Note (Signed)
 Date:  01/27/2024 Time:  10:02 AM  Group Topic/Focus:  Recreation Therapy/ Music Therapy    Participation Level:  Active  Participation Quality:  Appropriate  Affect:  Appropriate  Cognitive:  Appropriate  Insight: Appropriate  Engagement in Group:  Engaged  Modes of Intervention:  Activity   Adriana GORMAN Blush 01/27/2024, 10:02 AM

## 2024-01-27 NOTE — Group Note (Addendum)
 Date:  01/27/2024 Time:  12:59 AM  Group Topic/Focus:  Wrap-Up Group:   The focus of this group is to help patients review their daily goal of treatment and discuss progress on daily workbooks.    Participation Level:  Active  Participation Quality:  Appropriate and Sharing  Affect:  Appropriate  Cognitive:  Appropriate  Insight: Appropriate  Engagement in Group:  Engaged  Modes of Intervention:  Activity and Socialization  Additional Comments:  Patients shared one word to described their day. Patient described his day as rebirth because he feels like a he's been reborn, like a start, I've been get my mind right and feel refresh not like when I first got here. Patient rated his day a 10 out of 10.  Patient shared that he had learned, several things from the groups that he has attended that he will use once discharged.   Eward Mace 01/27/2024, 12:59 AM

## 2024-01-27 NOTE — Progress Notes (Signed)
" °  [facility] Adult Case Management Discharge Plan :  Will you be returning to the same living situation after discharge:  No.  Patient will stay with his mom, Grayce Rosaline Bohr (mother), [phone]. At discharge, do you have transportation home?: Yes,  CSW arranged transportation through [facility] for 11 AM Do you have the ability to pay for your medications: Yes, patient has insurance.  Release of information consent forms completed and in the chart;  Patient's signature needed at discharge.  Patient to Follow up at:  Follow-up Information     [facility]. Go on 02/08/2024.   Specialty: Behavioral Health Why: You have an appointment on 02/08/24 at 2:00 pm for an assessment, to obtain medication management and/or therapy services.  You may also go Monday through Friday, arrive by 7:00 am for an assessment. Contact information: [address] [place]  [phone]        [place]. Go on 01/31/2024.   Specialty: Professional Counselor Why: Please go to this provider on 01/31/24 at 9:00 am for an assessment, to obtain therapy services.  You may also go on Monday through Friday, from 9 am to 1 pm. Contact information: [address] [place]  [address] [place] [address] [phone]                 Next level of care provider has access to [facility] Link:no  Safety Planning and Suicide Prevention discussed: Yes,  Grayce Rosaline Bohr (mother), [phone]  Has patient been referred to the Quitline?: Patient refused referral for treatment  Patient has been referred for addiction treatment:  At admission, patient tested positive for marijuana.  Patient will follow up with an outpatient therapist for substance use disorder.  His appointment has been scheduled for 02/08/2024.     Humaira Sculley O Rhyann Berton, LCSW 01/27/2024, 8:14 AM "

## 2024-01-27 NOTE — Discharge Summary (Signed)
 " Physician Discharge Summary Note  Patient:  Derrick Joseph is an 24 y.o., male  MRN:  [id number]  DOB:  12-09-2000  Patient phone:  [phone] (home)   Patient address:   [address] [place] [address],   Total Time spent with patient: 45 minutes  Date of Admission:  01/18/2024  Date of Discharge: 01-27-24.  Reason for Admission: Complaint of erratic behaviors (shouting and claiming that he owns much of the city and hospital. He claims to be communicating with God and apparently today tried to jump out of his mother's car when driving.  Principal Problem: Bipolar disorder, current episode manic without psychotic features [facility])  Discharge Diagnoses: Principal Problem:   Bipolar disorder, current episode manic without psychotic features (HCC) Active Problems:   Cannabis use disorder, moderate, in controlled environment (HCC)   Bipolar 1 disorder (HCC)  Past Psychiatric History: See H&P.  Past Medical History: History reviewed. No pertinent past medical history. History reviewed. No pertinent surgical history.  Family History: History reviewed. No pertinent family history.  Family Psychiatric  History: See H&P.  Social History:  Social History   Substance and Sexual Activity  Alcohol Use Not Currently     Social History   Substance and Sexual Activity  Drug Use Not Currently    Social History   Socioeconomic History   Marital status: Single    Spouse name: Not on file   Number of children: Not on file   Years of education: Not on file   Highest education level: Not on file  Occupational History   Not on file  Tobacco Use   Smoking status: Every Day    Types: Cigarettes    Passive exposure: Current   Smokeless tobacco: Never  Vaping Use   Vaping status: Every Day   Substances: Nicotine , Flavoring  Substance and Sexual Activity   Alcohol use: Not Currently   Drug use: Not Currently   Sexual activity: Yes    Birth control/protection: Condom   Other Topics Concern   Not on file  Social History Narrative   Not on file   Social Drivers of Health   Tobacco Use: High Risk (01/18/2024)   Patient History    Smoking Tobacco Use: Every Day    Smokeless Tobacco Use: Never    Passive Exposure: Current  Financial Resource Strain: Not on file  Food Insecurity: No Food Insecurity (01/18/2024)   Epic    Worried About Programme Researcher, Broadcasting/film/video in the Last Year: Never true    Ran Out of Food in the Last Year: Never true  Transportation Needs: No Transportation Needs (01/18/2024)   Epic    Lack of Transportation (Medical): No    Lack of Transportation (Non-Medical): No  Physical Activity: Not on file  Stress: Not on file  Social Connections: Not on file  Depression (PHQ2-9): Low Risk (08/18/2022)   Depression (PHQ2-9)    PHQ-2 Score: 0  Alcohol Screen: Low Risk (01/18/2024)   Alcohol Screen    Last Alcohol Screening Score (AUDIT): 0  Housing: Low Risk (01/18/2024)   Epic    Unable to Pay for Housing in the Last Year: No    Number of Times Moved in the Last Year: 0    Homeless in the Last Year: No  Utilities: Not At Risk (01/18/2024)   Epic    Threatened with loss of utilities: No  Health Literacy: Not on file   Hospital Course: (Per admission evaluation notes): 24 year old AA  male with hx of mental illness (bipolar disorder) & cannabis use disorder. Admitted to the [facility] from the [facility] hospital with complaint of erratic behaviors (shouting and claiming that he owns much of the city and hospital. He claims to be communicating with God and apparently today tried to jump out of his mother's car when driving for help.  According to the IVC paperwork, he was also wearing a mask & talking about killing people).  A review of his current lab results has shown his UDS was positive for Benzodiazepine & THC). After medical evaluation & clearance at the [facility] ED, patient was recommended & transferred to the [facility] for inpatient psychiatric  admission for psychiatric evaluation/treatments.   This is the second psychiatric admission/discharge summaries for this 24 year old AA male from this [facility]. Morton was a patient in this [facility] in March of 2024 for psychiatric evaluation & treatments. At that time, he presented to the hospital with bizarre behaviors & manic symptoms. He was treated at that time, stabilized & discharged with an outpatient psychiatric care recommendation for  medication management. On this present admission, it was noted that patient had stopped taking his psychiatric medications & indulging in marijuana use per his UDS result. This led to his symptoms returning warranting another inpatient psychiatric admission/treatments.   After evaluation of his presenting symptoms this time around, it was jointly agreed by his treatment team to recommend [facility] for mood stabilization treatments.The medication regimen targeting his presenting symptoms were discussed & initiated with his consent. He was treated, stabilized & discharged on the medications as listed below on his discharge medication lists. He was also enrolled & participated in the group counseling sessions being offered & held on this unit. He learned coping skills. He presented no other significant pre-existing medical issues that required treatment & or monitoring. He tolerated his treatment regimen without any adverse effects or reactions reported.    Orlondo's symptoms responded well to his treatment regimen warranting this discharge. This is evidenced by his daily reports of improved mood, resolution of symptoms, presentation of good affect/eye contact. He currently presents mentally & medically stable for discharge to continue mental health care on an outpatient basis as recommended below. And to aid medication compliance, Boaz was started on a po Invega  tablets upon his admission which was transitioned to Q monthly Invega  injectable prior to discharge. He is due for another  Invega  injectable a month from today 01-27-24. This is well recorded & explained on his discharge instructions.    Today upon his discharge evaluation with his treatment team, Jerod shares, I'm doing good. I feel much better. He denies any specific concerns. He is sleeping well. His appetite is good. He denies any other physical complaints. He denies SI/HI/AH/VH, delusional thoughts or paranoia. He is tolerating his medications well & in agreement to continue his current treatment regimen as recommended. He will follow up for routine psychiatric care & medication management as noted below. He is provided with all the necessary information needed to make these appointments without problems. Tyvon was able to engage in safety planning including plan to return to [facility]: [facility] or contact emergency services if he feels unable to maintain his own safety or the safety of others. Patient has no further questions, comments or concerns. He left [facility] with all personal belongings in no apparent distress. Transportation per taxi. [facility] assisted with taxi fare .  Physical Findings: AIMS:  , ,  ,  ,  ,  ,  CIWA:    COWS:     Musculoskeletal: Strength & Muscle Tone: within normal limits Gait & Station: normal Patient leans: N/A   Psychiatric Specialty Exam:  Presentation  General Appearance:  Appropriate for Environment; Casual; Well Groomed  Eye Contact: Good  Speech: Clear and Coherent; Normal Rate  Speech Volume: Normal  Handedness: Right   Mood and Affect  Mood: Euthymic  Affect: Appropriate; Congruent   Thought Process  Thought Processes: Coherent  Descriptions of Associations:Intact  Orientation:Full (Time, Place and Person)  Thought Content:Logical  History of Schizophrenia/Schizoaffective disorder:No data recorded Duration of Psychotic Symptoms:No data recorded Hallucinations:Hallucinations: None  Ideas of Reference:None  Suicidal Thoughts:Suicidal Thoughts: No  Homicidal  Thoughts:Homicidal Thoughts: No  Sensorium  Memory: Immediate Good; Recent Good; Remote Good  Judgment: Good  Insight: Good   Executive Functions  Concentration: Good  Attention Span: Good  Recall: Good  Fund of Knowledge: Fair  Language: Good   Psychomotor Activity  Psychomotor Activity: Psychomotor Activity: Normal   Assets  Assets: Communication Skills; Desire for Improvement; Financial Resources/Insurance; Housing; Physical Health; Resilience; Social Support  Sleep  Sleep: Sleep: Good  Estimated Sleeping Duration (Last 24 Hours): 4.25-5.75 hours  Physical Exam: Physical Exam Vitals and nursing note reviewed.  HENT:     Head: Normocephalic.     Nose: Nose normal.  Eyes:     Pupils: Pupils are equal, round, and reactive to light.  Cardiovascular:     Rate and Rhythm: Normal rate.     Pulses: Normal pulses.  Pulmonary:     Effort: Pulmonary effort is normal.  Genitourinary:    Comments: Deferred. Musculoskeletal:        General: Normal range of motion.     Cervical back: Normal range of motion.  Skin:    General: Skin is dry.  Neurological:     General: No focal deficit present.     Mental Status: He is alert and oriented to person, place, and time. Mental status is at baseline.    Review of Systems  Constitutional:  Negative for chills, diaphoresis and fever.  HENT:  Negative for congestion and sore throat.   Eyes:  Negative for blurred vision.  Respiratory:  Negative for cough, shortness of breath and wheezing.   Cardiovascular:  Negative for chest pain and palpitations.  Gastrointestinal:  Negative for abdominal pain, constipation, diarrhea, heartburn, nausea and vomiting.  Genitourinary:  Negative for dysuria.  Musculoskeletal:  Negative for joint pain and myalgias.  Skin:  Negative for itching and rash.  Neurological:  Negative for dizziness, tingling, tremors, sensory change, speech change, focal weakness, seizures, loss of  consciousness, weakness and headaches.  Endo/Heme/Allergies:        NKDA.  Psychiatric/Behavioral:  Positive for substance abuse (Hx. THC use disorder.). Negative for depression, hallucinations, memory loss and suicidal ideas. The patient is not nervous/anxious and does not have insomnia.    Blood pressure 121/71, pulse 75, temperature 97.7 F (36.5 C), temperature source Oral, resp. rate 18, height 5' 7 (1.702 m), weight 56.7 kg, SpO2 100%. Body mass index is 19.58 kg/m.  Tobacco Use History[1] Tobacco Cessation:  AN FDA A-approved tobacco cessation medication recommended at discharge  Blood Alcohol level:  Lab Results  Component Value Date   [facility] <15 01/17/2024   ETH <10 05/05/2022   Metabolic Disorder Labs:  Lab Results  Component Value Date   HGBA1C 5.4 01/20/2024   MPG 108.28 01/20/2024   MPG 114 03/10/2022   No results found for: PROLACTIN  Lab Results  Component Value Date   CHOL 124 01/27/2024   TRIG 41 01/27/2024   HDL 70 01/27/2024   CHOLHDL 1.8 01/27/2024   VLDL 8 01/27/2024   LDLCALC 46 01/27/2024   LDLCALC 58 03/11/2022   See Psychiatric Specialty Exam and Suicide Risk Assessment completed by Attending Physician prior to discharge.  Discharge destination:  Home  Is patient on multiple antipsychotic therapies at discharge:  No   Has Patient had three or more failed trials of antipsychotic monotherapy by history:  No  Recommended Plan for Multiple Antipsychotic Therapies: NA   Allergies as of 01/27/2024   No Known Allergies      Medication List     TAKE these medications      Indication  divalproex  250 MG DR tablet Commonly known as: DEPAKOTE  Take 1 tablet (250 mg total) by mouth every 12 (twelve) hours. For mood stabilization.  Indication: Mood stabilization.   hydrOXYzine  25 MG tablet Commonly known as: ATARAX  Take 1 tablet (25 mg total) by mouth 3 (three) times daily as needed for anxiety.  Indication: Feeling Anxious   nicotine   polacrilex 2 MG gum Commonly known as: NICORETTE  Take 1 each (2 mg total) by mouth as needed. (May buy from over the counter): For smoking cessation.  Indication: Nicotine  Addiction   paliperidone  156 MG/ML Susy injection Commonly known as: INVEGA  SUSTENNA Inject 1 mL (156 mg total) into the muscle every 28 (twenty-eight) days for 28 days, THEN no dosage for 28 days, THEN 1 mL (156 mg total) every 30 (thirty) days for 28 days. Start taking on: January 27, 2024  Indication: Mood control   traZODone  100 MG tablet Commonly known as: DESYREL  Take 1 tablet (100 mg total) by mouth at bedtime as needed for sleep.  Indication: Trouble Sleeping        Follow-up Information     [facility]. Go on 02/08/2024.   Specialty: Behavioral Health Why: You have an appointment on 02/08/24 at 2:00 pm for an assessment, to obtain medication management and/or therapy services.  You may also go Monday through Friday, arrive by 7:00 am for an assessment. Contact information: [address] [place]  [address] [phone]        [place]. Go on 01/31/2024.   Specialty: Professional Counselor Why: Please go to this provider on 01/31/24 at 9:00 am for an assessment, to obtain therapy services.  You may also go on Monday through Friday, from 9 am to 1 pm. Contact information: [address] [place]  [address] [place] [address] [phone]                 Plan Of Care/Follow-up recommendations:  Activity: as tolerated  Diet: heart healthy  Other: -Follow-up with your outpatient psychiatric provider -instructions on appointment date, time, and address (location) are provided to you in discharge paperwork.  -Take your psychiatric medications as prescribed at discharge - instructions are provided to you in the discharge paperwork  -Follow-up with outpatient primary care doctor and other specialists -for management of preventative  medicine and chronic medical issues  -Testing: Follow-up with outpatient provider for abnormal lab results: NA  -If you are prescribed an atypical antipsychotic medication, we recommend that your outpatient psychiatrist follow routine screening for side effects within 3 months of discharge, including monitoring: AIMS scale, height, weight, blood pressure, fasting lipid panel, HbA1c, and fasting blood sugar.   -Recommend total abstinence from alcohol, tobacco, and other illicit drug use at  discharge.   -If your psychiatric symptoms recur, worsen, or if you have side effects to your psychiatric medications, call your outpatient psychiatric provider, 911, [phone] or go to the nearest emergency department.  -If suicidal thoughts occur, immediately call your outpatient psychiatric provider, 911, [phone] or go to the nearest emergency department.  Signed: Mac Bolster, NP, pmhnp, fnp-bc. 01/27/2024, 4:59 PM           [1]  Social History Tobacco Use  Smoking Status Every Day   Types: Cigarettes   Passive exposure: Current  Smokeless Tobacco Never   "

## 2024-01-27 NOTE — Group Note (Signed)
 Recreation Therapy Group Note   Group Topic:Leisure Education  Group Date: 01/27/2024 Start Time: 0955 End Time: 1045 Facilitators: Aritha Huckeba-McCall, LRT,CTRS Location: [address] Dayroom   Group Topic: Leisure Education  Goal Area(s) Addresses:  Patient will identify positive leisure and recreation activities.  Patient will identify one positive benefit of participation in leisure activities.   Behavioral Response: Active  Intervention: Music Therapy  Activity: LRT and patients talked about the many uses of music. LRT and patients also discussed how music can be used as a leisure outlet. LRT then allowed patients to pick various songs they wanted to listen to and/or introduce peers to. Each song picked had to be clean and appropriate.  Education:  Teacher, English As A Foreign Language, Special Educational Needs Teacher, Discharge Planning  Education Outcome: Acknowledges education/In group clarification offered/Needs additional education   Affect/Mood: Appropriate   Participation Level: Active   Participation Quality: Independent   Behavior: Attentive    Speech/Thought Process: Relevant   Insight: Moderate   Judgement: Moderate   Modes of Intervention: Music   Patient Response to Interventions:  Receptive   Education Outcome:  In group clarification offered    Clinical Observations/Individualized Feedback: Pt was laid back and attentive to the songs picked by his peers. Pt was also social with peers about the songs they picked. Pt was appropriate throughout group.    Plan: Continue to engage patient in RT group sessions 2-3x/week.   Derrick Joseph, LRT,CTRS 01/27/2024 11:40 AM

## 2024-01-27 NOTE — Progress Notes (Signed)
 Patient was given return to work note.   Dyami Umbach, LCSW 01/27/2024

## 2024-01-27 NOTE — Group Note (Signed)
 Date:  01/27/2024 Time:  10:57 AM  Group Topic/Focus:  Nutrition:     Participation Level:  Active  Participation Quality:  Appropriate  Affect:  Appropriate  Cognitive:  Appropriate  Insight: Appropriate  Engagement in Group:  Engaged  Modes of Intervention:  Activity    Adriana GORMAN Blush 01/27/2024, 10:57 AM

## 2024-01-27 NOTE — Progress Notes (Signed)
 Pt discharged at this time. Pt left facility via taxi. Pt removed all belongings. Pt denies SI/HI/AVH. Pt verbalized understanding of medications and discharge instructions.

## 2024-01-27 NOTE — Group Note (Deleted)
 Date:  01/27/2024 Time:  12:32 AM  Group Topic/Focus:  Wrap-Up Group:   The focus of this group is to help patients review their daily goal of treatment and discuss progress on daily workbooks.     Participation Level:  {[facility] PARTICIPATION OZCZO:77735}  Participation Quality:  {[facility] PARTICIPATION QUALITY:22265}  Affect:  {[facility] AFFECT:22266}  Cognitive:  {[facility] COGNITIVE:22267}  Insight: {BHH Insight2:20797}  Engagement in Group:  {[facility] ENGAGEMENT IN HMNLE:77731}  Modes of Intervention:  {BHH MODES OF INTERVENTION:22269}  Additional Comments:  ***  Eward Mace 01/27/2024, 12:32 AM

## 2024-01-27 NOTE — Group Note (Signed)
 Date:  01/27/2024 Time:  9:44 AM  Group Topic/Focus:  Goals Group:   The focus of this group is to help patients establish daily goals to achieve during treatment and discuss how the patient can incorporate goal setting into their daily lives to aide in recovery.    Participation Level:  Active  Participation Quality:  Appropriate  Affect:  Appropriate  Cognitive:  Alert  Insight: Good  Engagement in Group:  Engaged  Modes of Intervention:  Discussion  Additional Comments:  Pt stated that his goal is to stay calm and relax after he is released.   Adriana GORMAN Blush 01/27/2024, 9:44 AM

## 2024-01-27 NOTE — [facility] Suicide Risk Assessment (Signed)
 Suicide Risk Assessment  Discharge Assessment    [facility] Discharge Suicide Risk Assessment   Principal Problem: Bipolar disorder, current episode manic without psychotic features [facility])  Discharge Diagnoses: Principal Problem:   Bipolar disorder, current episode manic without psychotic features (HCC) Active Problems:   Cannabis use disorder, moderate, in controlled environment (HCC)   Bipolar 1 disorder (HCC)  Total Time spent with patient: 45 minutes  Musculoskeletal: Strength & Muscle Tone: within normal limits Gait & Station: normal Patient leans: N/A  Psychiatric Specialty Exam  Presentation  General Appearance:  Appropriate for Environment; Casual; Well Groomed  Eye Contact: Good  Speech: Clear and Coherent; Normal Rate  Speech Volume: Normal  Handedness: Right   Mood and Affect  Mood: Euthymic  Duration of Depression Symptoms:  Affect: Appropriate; Congruent   Thought Process  Thought Processes: Coherent  Descriptions of Associations:Intact  Orientation:Full (Time, Place and Person)  Thought Content:Logical  History of Schizophrenia/Schizoaffective disorder: NA Duration of Psychotic Symptoms: NA Hallucinations:Hallucinations: None  Ideas of Reference:None  Suicidal Thoughts:Suicidal Thoughts: No  Homicidal Thoughts:Homicidal Thoughts: No   Sensorium  Memory: Immediate Good; Recent Good; Remote Good  Judgment: Good  Insight: Good   Executive Functions  Concentration: Good  Attention Span: Good  Recall: Good  Fund of Knowledge: Fair  Language: Good  Psychomotor Activity  Psychomotor Activity: Psychomotor Activity: Normal  Assets  Assets: Communication Skills; Desire for Improvement; Financial Resources/Insurance; Housing; Physical Health; Resilience; Social Support   Sleep  Sleep: Sleep: Good  Estimated Sleeping Duration (Last 24 Hours): 4.25-5.75 hours  Physical Exam: See the discharge summary.  Blood  pressure 121/71, pulse 75, temperature 97.7 F (36.5 C), temperature source Oral, resp. rate 18, height 5' 7 (1.702 m), weight 56.7 kg, SpO2 100%. Body mass index is 19.58 kg/m.  Mental Status Per Nursing Assessment::   On Admission:  NA  Demographic Factors:  Male, Adolescent or young adult, and Low socioeconomic status  Loss Factors: NA  Historical Factors: NA  Risk Reduction Factors:   Sense of responsibility to family, Religious beliefs about death, Living with another person, especially a relative, Positive social support, Positive therapeutic relationship, and Positive coping skills or problem solving skills  Continued Clinical Symptoms:  Previous Psychiatric Diagnoses and Treatments  Cognitive Features That Contribute To Risk:  None    Suicide Risk:  Minimal: No identifiable suicidal ideation.  Patients presenting with no risk factors but with morbid ruminations; may be classified as minimal risk based on the severity of the depressive symptoms   Follow-up Information     [facility]. Go on 02/08/2024.   Specialty: Behavioral Health Why: You have an appointment on 02/08/24 at 2:00 pm for an assessment, to obtain medication management and/or therapy services.  You may also go Monday through Friday, arrive by 7:00 am for an assessment. Contact information: [address] [place]  [address] [phone]        [facility] Go on 01/31/2024.   Specialty: Professional Counselor Why: Please go to this provider on 01/31/24 at 9:00 am for an assessment, to obtain therapy services.  You may also go on Monday through Friday, from 9 am to 1 pm. Contact information: [address] [place]  [address] [place] [address] [phone]                Plan Of Care/Follow-up recommendations:  See the discharge recommendations above.  Mac Bolster, NP, pmhnp, [id number]. 01/27/2024, 10:07 AM

## 2024-01-27 NOTE — Transportation (Signed)
 01/27/2024  Derrick Joseph DOB: [birth year] MRN: [id number]   RIDER WAIVER AND RELEASE OF LIABILITY  For the purposes of helping with transportation needs, [facility] partners with outside transportation providers (taxi companies, [place], catering manager.) to give [facility] patients or other approved people the choice of on-demand rides Public Librarian) to our buildings for non-emergency visits.  By using [employer], I, the person signing this document, on behalf of myself and/or any legal minors (in my care using the [employer]), agree:  Science Writer given to me are supplied by independent, outside transportation providers who do not work for, or have any affiliation with, [employer]. [employer] is not a transportation company. [facility] has no control over the quality or safety of the rides I get using [employer]. [facility] has no control over whether any outside ride will happen on time or not. [facility] gives no guarantee on the reliability, quality, safety, or availability on any rides, or that no mistakes will happen. I know and accept that traveling by vehicle (car, truck, SVU, fleeta, bus, taxi, etc.) has risks of serious injuries such as disability, being paralyzed, and death. I know and agree the risk of using [employer] is mine alone, and not [employer]. [employer] are provided as is and as are available. The transportation providers are in charge for all inspections and care of the vehicles used to provide these rides. I agree not to take legal action against [facility], its agents, employees, officers, directors, representatives, insurers, attorneys, assigns, successors, subsidiaries, and affiliates at any time for any reasons related directly or indirectly to using [employer]. I also agree not to take legal action against [facility] or its affiliates for any injury, death, or damage to property caused by or related to using  [employer]. I have read this Waiver and Release of Liability, and I understand the terms used in it and their legal meaning. This Waiver is freely and voluntarily given with the understanding that my right (or any legal minors) to legal action against [facility] relating to [employer] is knowingly given up to use these services.   I attest that I read the Ride Waiver and Release of Liability to Derrick Joseph, gave Mr. Mustin the opportunity to ask questions and answered the questions asked (if any). I affirm that Derrick Joseph then provided consent for assistance with transportation.       Nathyn Luiz, LCSW 01/27/2024

## 2024-02-08 ENCOUNTER — Ambulatory Visit (HOSPITAL_COMMUNITY): Payer: MEDICAID | Admitting: Psychiatry
